# Patient Record
Sex: Male | Born: 1960
Health system: Southern US, Community
[De-identification: ages and names within clinical notes are randomized; demographics above are authoritative.]

## PROBLEM LIST (undated history)

## (undated) DIAGNOSIS — Z87442 Personal history of urinary calculi: Secondary | ICD-10-CM

## (undated) DIAGNOSIS — E785 Hyperlipidemia, unspecified: Secondary | ICD-10-CM

## (undated) DIAGNOSIS — I2510 Atherosclerotic heart disease of native coronary artery without angina pectoris: Secondary | ICD-10-CM

## (undated) DIAGNOSIS — I10 Essential (primary) hypertension: Secondary | ICD-10-CM

## (undated) DIAGNOSIS — M19011 Primary osteoarthritis, right shoulder: Secondary | ICD-10-CM

## (undated) DIAGNOSIS — G4733 Obstructive sleep apnea (adult) (pediatric): Secondary | ICD-10-CM

## (undated) DIAGNOSIS — E669 Obesity, unspecified: Secondary | ICD-10-CM

## (undated) DIAGNOSIS — N200 Calculus of kidney: Secondary | ICD-10-CM

## (undated) HISTORY — DX: Calculus of kidney: N20.0

## (undated) HISTORY — PX: COLONOSCOPY: SHX174

## (undated) HISTORY — PX: OTHER SURGICAL HISTORY: SHX169

## (undated) HISTORY — PX: CARDIAC CATHETERIZATION: SHX172

## (undated) HISTORY — PX: MUSCLE BIOPSY: SHX716

---
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
## 1998-08-22 ENCOUNTER — Encounter: Admission: RE | Admit: 1998-08-22 | Discharge: 1998-11-20 | Payer: Self-pay | Admitting: Family Medicine

## 2003-01-22 ENCOUNTER — Encounter (INDEPENDENT_AMBULATORY_CARE_PROVIDER_SITE_OTHER): Payer: Self-pay | Admitting: Specialist

## 2003-01-22 ENCOUNTER — Ambulatory Visit (HOSPITAL_BASED_OUTPATIENT_CLINIC_OR_DEPARTMENT_OTHER): Admission: RE | Admit: 2003-01-22 | Discharge: 2003-01-22 | Payer: Self-pay | Admitting: General Surgery

## 2008-12-13 ENCOUNTER — Ambulatory Visit (HOSPITAL_COMMUNITY): Payer: Self-pay | Admitting: Psychology

## 2011-02-09 NOTE — Op Note (Signed)
   NAME:  Frederick Baker, Frederick Baker                        ACCOUNT NO.:  [id number]   MEDICAL RECORD NO.:  [id number]                   PATIENT TYPE:  AMB   LOCATION:  DSC                                  FACILITY:  [facility]   PHYSICIAN:  Ollen Gross. Vernell Morgans, M.D.              DATE OF BIRTH:  10-Feb-1961   DATE OF PROCEDURE:  01/22/2003  DATE OF DISCHARGE:                                 OPERATIVE REPORT   PREOPERATIVE DIAGNOSIS:  Myositis.   POSTOPERATIVE DIAGNOSIS:  Myositis.   PROCEDURE:  Right thigh muscle biopsy.   SURGEON:  Ollen Gross. Carolynne Edouard, M.D.   ANESTHESIA:  IV sedation and local.   DESCRIPTION OF PROCEDURE:  After informed consent was obtained, the patient  was brought to the operating room and placed in the supine position on the  operating table.  After adequate induction of some IV sedation, the  patient's right thigh was prepped with Betadine and draped in the usual  sterile manner.  The right thigh area was then infiltrated with 0.25%  Marcaine with epinephrine.  A small longitudinal incision was made with a 15  blade knife.  This incision was carried down through the skin and  subcutaneous tissue with the Bovie electrocautery.  The fascia of the vastus  lateralis was then opened with the Metzenbaum scissors.  A muscle bundle was  isolated with a hemostat and divided proximally and distally with the  Metzenbaum scissors.  The muscle bundle itself was about an inch long.  The  muscle bundle was placed on a tongue depressor and anchored in place with  needles, then sent fresh to pathology for further evaluation.  The wound was  then examined and found to be hemostatic.  The fascia of the vastus  lateralis was then closed with a running 3-0 Vicryl stitch and the skin was  closed with a running 4-0 Monocryl subcuticular stitch.  Benzoin and Steri-  Strips and sterile dressings were applied.  The patient tolerated the  procedure well.  At the end of the case all needle, sponge, and  instrument  counts were correct.  The patient was then taken to the recovery room in  stable condition.                                               Ollen Gross. Vernell Morgans, M.D.    PST/MEDQ  D:  01/22/2003  T:  01/23/2003  Job:  [id number]

## 2012-12-13 ENCOUNTER — Encounter (HOSPITAL_BASED_OUTPATIENT_CLINIC_OR_DEPARTMENT_OTHER): Payer: Self-pay | Admitting: Emergency Medicine

## 2012-12-13 ENCOUNTER — Other Ambulatory Visit: Payer: Self-pay

## 2012-12-13 ENCOUNTER — Ambulatory Visit (HOSPITAL_COMMUNITY): Admit: 2012-12-13 | Payer: Self-pay | Admitting: Cardiovascular Disease

## 2012-12-13 ENCOUNTER — Encounter (HOSPITAL_COMMUNITY)
Admission: EM | Disposition: A | Discharge status: Home / Self Care | Payer: Self-pay | Source: Home / Self Care | Attending: Cardiovascular Disease

## 2012-12-13 ENCOUNTER — Inpatient Hospital Stay (HOSPITAL_BASED_OUTPATIENT_CLINIC_OR_DEPARTMENT_OTHER)
Admission: EM | Admit: 2012-12-13 | Discharge: 2012-12-16 | DRG: 247 | Disposition: A | Discharge status: Home / Self Care | Payer: PRIVATE HEALTH INSURANCE | Attending: Cardiovascular Disease | Admitting: Cardiovascular Disease

## 2012-12-13 DIAGNOSIS — I2582 Chronic total occlusion of coronary artery: Secondary | ICD-10-CM | POA: Diagnosis present

## 2012-12-13 DIAGNOSIS — Z8249 Family history of ischemic heart disease and other diseases of the circulatory system: Secondary | ICD-10-CM

## 2012-12-13 DIAGNOSIS — I10 Essential (primary) hypertension: Secondary | ICD-10-CM | POA: Diagnosis present

## 2012-12-13 DIAGNOSIS — I2119 ST elevation (STEMI) myocardial infarction involving other coronary artery of inferior wall: Principal | ICD-10-CM | POA: Diagnosis present

## 2012-12-13 DIAGNOSIS — Z79899 Other long term (current) drug therapy: Secondary | ICD-10-CM

## 2012-12-13 DIAGNOSIS — I2510 Atherosclerotic heart disease of native coronary artery without angina pectoris: Secondary | ICD-10-CM

## 2012-12-13 DIAGNOSIS — Z955 Presence of coronary angioplasty implant and graft: Secondary | ICD-10-CM

## 2012-12-13 DIAGNOSIS — Z888 Allergy status to other drugs, medicaments and biological substances status: Secondary | ICD-10-CM

## 2012-12-13 DIAGNOSIS — E785 Hyperlipidemia, unspecified: Secondary | ICD-10-CM | POA: Diagnosis present

## 2012-12-13 HISTORY — PX: LEFT HEART CATHETERIZATION WITH CORONARY ANGIOGRAM: SHX5451

## 2012-12-13 HISTORY — PX: PERCUTANEOUS CORONARY STENT INTERVENTION (PCI-S): SHX5485

## 2012-12-13 HISTORY — DX: Atherosclerotic heart disease of native coronary artery without angina pectoris: I25.10

## 2012-12-13 HISTORY — DX: Essential (primary) hypertension: I10

## 2012-12-13 HISTORY — DX: Hyperlipidemia, unspecified: E78.5

## 2012-12-13 LAB — CBC WITH DIFFERENTIAL/PLATELET
Basophils Absolute: 0 10*3/uL (ref 0.0–0.1)
Basophils Relative: 0 % (ref 0–1)
Eosinophils Absolute: 0 10*3/uL (ref 0.0–0.7)
Eosinophils Relative: 0 % (ref 0–5)
HCT: 42.9 % (ref 39.0–52.0)
Hemoglobin: 15.8 g/dL (ref 13.0–17.0)
Lymphocytes Relative: 11 % — ABNORMAL LOW (ref 12–46)
Lymphs Abs: 1.7 10*3/uL (ref 0.7–4.0)
MCH: 33.8 pg (ref 26.0–34.0)
MCHC: 36.8 g/dL — ABNORMAL HIGH (ref 30.0–36.0)
MCV: 91.7 fL (ref 78.0–100.0)
Monocytes Absolute: 0.7 10*3/uL (ref 0.1–1.0)
Monocytes Relative: 5 % (ref 3–12)
Neutro Abs: 12.4 10*3/uL — ABNORMAL HIGH (ref 1.7–7.7)
Neutrophils Relative %: 84 % — ABNORMAL HIGH (ref 43–77)
Platelets: 223 10*3/uL (ref 150–400)
RBC: 4.68 MIL/uL (ref 4.22–5.81)
RDW: 11.9 % (ref 11.5–15.5)
WBC: 14.7 10*3/uL — ABNORMAL HIGH (ref 4.0–10.5)

## 2012-12-13 LAB — COMPREHENSIVE METABOLIC PANEL
ALT: 55 U/L — ABNORMAL HIGH (ref 0–53)
AST: 35 U/L (ref 0–37)
Albumin: 4.6 g/dL (ref 3.5–5.2)
Alkaline Phosphatase: 63 U/L (ref 39–117)
BUN: 14 mg/dL (ref 6–23)
CO2: 26 mEq/L (ref 19–32)
Calcium: 9.4 mg/dL (ref 8.4–10.5)
Chloride: 100 mEq/L (ref 96–112)
Creatinine, Ser: 1.1 mg/dL (ref 0.50–1.35)
GFR calc Af Amer: 87 mL/min — ABNORMAL LOW (ref >90)
GFR calc non Af Amer: 75 mL/min — ABNORMAL LOW (ref >90)
Glucose, Bld: 106 mg/dL — ABNORMAL HIGH (ref 70–99)
Potassium: 3.7 mEq/L (ref 3.5–5.1)
Sodium: 140 mEq/L (ref 135–145)
Total Bilirubin: 0.6 mg/dL (ref 0.3–1.2)
Total Protein: 8.3 g/dL (ref 6.0–8.3)

## 2012-12-13 LAB — MRSA PCR SCREENING: MRSA by PCR: NEGATIVE

## 2012-12-13 LAB — TROPONIN I: Troponin I: <0.3 ng/mL (ref <0.30)

## 2012-12-13 LAB — PROTIME-INR
INR: 1.03 (ref 0.00–1.49)
Prothrombin Time: 13.4 seconds (ref 11.6–15.2)

## 2012-12-13 LAB — APTT: aPTT: 28 seconds (ref 24–37)

## 2012-12-13 SURGERY — LEFT HEART CATHETERIZATION WITH CORONARY ANGIOGRAM
Anesthesia: LOCAL

## 2012-12-13 MED ORDER — ASPIRIN 81 MG PO CHEW
CHEWABLE_TABLET | ORAL | Status: AC
  Administered 2012-12-13: 324 mg via ORAL
  Filled 2012-12-13: qty 4

## 2012-12-13 MED ORDER — FENTANYL CITRATE 0.05 MG/ML IJ SOLN
INTRAMUSCULAR | Status: AC
  Filled 2012-12-13: qty 2

## 2012-12-13 MED ORDER — HEPARIN (PORCINE) IN NACL 2-0.9 UNIT/ML-% IJ SOLN
INTRAMUSCULAR | Status: AC
  Filled 2012-12-13: qty 1000

## 2012-12-13 MED ORDER — ACETAMINOPHEN 325 MG PO TABS: 650.0000 mg | ORAL_TABLET | ORAL | Status: DC | PRN

## 2012-12-13 MED ORDER — ONDANSETRON HCL 4 MG/2ML IJ SOLN: 4.0000 mg | Freq: Once | INTRAMUSCULAR | Status: AC

## 2012-12-13 MED ORDER — HEPARIN SODIUM (PORCINE) 5000 UNIT/ML IJ SOLN: 4000.0000 [IU] | Freq: Once | INTRAMUSCULAR | Status: AC

## 2012-12-13 MED ORDER — HEPARIN SODIUM (PORCINE) 5000 UNIT/ML IJ SOLN
INTRAMUSCULAR | Status: AC
  Administered 2012-12-13: 4000 [IU] via INTRAVENOUS
  Filled 2012-12-13: qty 1

## 2012-12-13 MED ORDER — SODIUM CHLORIDE 0.9 % IV SOLN
INTRAVENOUS | Status: AC
  Administered 2012-12-13: 18:00:00 via INTRAVENOUS

## 2012-12-13 MED ORDER — ASPIRIN 81 MG PO CHEW
81.0000 mg | CHEWABLE_TABLET | Freq: Every day | ORAL | Status: DC
  Administered 2012-12-14 – 2012-12-16 (×3): 81 mg via ORAL
  Filled 2012-12-13 (×3): qty 1

## 2012-12-13 MED ORDER — HEPARIN BOLUS VIA INFUSION: 4000.0000 [IU] | Freq: Once | INTRAVENOUS | Status: DC

## 2012-12-13 MED ORDER — PRASUGREL HCL 10 MG PO TABS
10.0000 mg | ORAL_TABLET | Freq: Every day | ORAL | Status: DC
  Administered 2012-12-15 – 2012-12-16 (×2): 10 mg via ORAL
  Filled 2012-12-13 (×3): qty 1

## 2012-12-13 MED ORDER — LISINOPRIL 10 MG PO TABS
10.0000 mg | ORAL_TABLET | Freq: Every morning | ORAL | Status: DC
  Administered 2012-12-13 – 2012-12-16 (×3): 10 mg via ORAL
  Filled 2012-12-13 (×3): qty 1

## 2012-12-13 MED ORDER — ONDANSETRON HCL 4 MG/2ML IJ SOLN: 4.0000 mg | Freq: Four times a day (QID) | INTRAMUSCULAR | Status: DC | PRN

## 2012-12-13 MED ORDER — MORPHINE SULFATE 2 MG/ML IJ SOLN: 2.0000 mg | INTRAMUSCULAR | Status: DC | PRN

## 2012-12-13 MED ORDER — PRASUGREL HCL 10 MG PO TABS
ORAL_TABLET | ORAL | Status: AC
  Administered 2012-12-14: 10 mg via ORAL
  Filled 2012-12-13: qty 6

## 2012-12-13 MED ORDER — ASPIRIN 81 MG PO CHEW: 324.0000 mg | CHEWABLE_TABLET | Freq: Once | ORAL | Status: AC

## 2012-12-13 MED ORDER — LIDOCAINE HCL (PF) 1 % IJ SOLN
INTRAMUSCULAR | Status: AC
  Filled 2012-12-13: qty 30

## 2012-12-13 MED ORDER — METOPROLOL TARTRATE 25 MG PO TABS
25.0000 mg | ORAL_TABLET | Freq: Two times a day (BID) | ORAL | Status: DC
  Administered 2012-12-13 – 2012-12-16 (×6): 25 mg via ORAL
  Filled 2012-12-13 (×7): qty 1

## 2012-12-13 MED ORDER — BIVALIRUDIN 250 MG IV SOLR
INTRAVENOUS | Status: AC
  Filled 2012-12-13: qty 250

## 2012-12-13 MED ORDER — ONDANSETRON HCL 4 MG/2ML IJ SOLN
INTRAMUSCULAR | Status: AC
  Administered 2012-12-13: 4 mg via INTRAVENOUS
  Filled 2012-12-13: qty 2

## 2012-12-13 MED ORDER — MORPHINE SULFATE 4 MG/ML IJ SOLN
INTRAMUSCULAR | Status: AC
  Administered 2012-12-13: 4 mg via INTRAVENOUS
  Filled 2012-12-13: qty 1

## 2012-12-13 MED ORDER — MIDAZOLAM HCL 2 MG/2ML IJ SOLN
INTRAMUSCULAR | Status: AC
  Filled 2012-12-13: qty 2

## 2012-12-13 MED ORDER — MORPHINE SULFATE 4 MG/ML IJ SOLN: 4.0000 mg | Freq: Once | INTRAMUSCULAR | Status: AC

## 2012-12-13 MED ORDER — ATORVASTATIN CALCIUM 80 MG PO TABS
80.0000 mg | ORAL_TABLET | Freq: Every day | ORAL | Status: DC
  Filled 2012-12-13: qty 1

## 2012-12-13 MED ORDER — NITROGLYCERIN 0.4 MG SL SUBL
0.4000 mg | SUBLINGUAL_TABLET | SUBLINGUAL | Status: DC | PRN
  Administered 2012-12-13: 0.4 mg via SUBLINGUAL
  Filled 2012-12-13: qty 25

## 2012-12-13 MED ORDER — ZOLPIDEM TARTRATE 5 MG PO TABS: 5.0000 mg | ORAL_TABLET | Freq: Every evening | ORAL | Status: DC | PRN

## 2012-12-13 MED ORDER — OXYCODONE-ACETAMINOPHEN 5-325 MG PO TABS: 1.0000 | ORAL_TABLET | ORAL | Status: DC | PRN

## 2012-12-13 NOTE — CV Procedure (Addendum)
Cardiac Catheterization Operative Report  Frederick Baker [id number] 3/22/20144:14 PM No primary provider on file.  Procedure Performed:  1. Left Heart Catheterization 2. Selective Coronary Angiography 3. Left ventricular angiogram 4.   PTCA/DES x 1 posterolateral branch, right 5.   PTCA/DES x 1 distal RCA 6.   Angioseal right femoral artery  Operator: Verne Carrow, MD  Indication:  52 yo male with history of HTN, HLD admitted with inferior STEMI.              Arrival in cath lab at [facility] at 4:18pm. Arterial access at 4:28pm.                         Procedure Details: Emergency consent obtained. The risks, benefits, complications, treatment options, and expected outcomes were discussed with the patient. The patient and/or family concurred with the proposed plan, giving verbal  informed consent. The patient was further sedated with Versed and Fentanyl. The right groin was prepped and draped in the usual manner. Using the modified Seldinger access technique, a 6 French sheath was placed in the right femoral artery. Standard diagnostic catheters were used to perform selective coronary angiography.   The RCA was engaged with a JR4 guide. The patient was given a bolus of Angiomax and a drip was started. He was given Effient 60 mg po x 1. A BMW wire was passed down the RCA into the posterolateral branch but I could not get this wire past the total occlusion so a Whisper wire was passed distally. A 2.0 x 8 mm balloon was used to open the totally occluded posterolateral branch. I then carefully positioned and deployed a 2.25 x 12 mm Promus Premier DES in the posterolateral branch. This was post-dilated with a 2.25 x 8 mm Nash balloon. I then turned by attention to the severe stenosis in the distal RCA. I elected to treat the distal RCA lesion today as it was severe, hazy and could limit inflow into the newly placed more distal stent. The distal RCA stenosis was directly stented with a  3.5 x 12 mm Promus Premier DES. This stent was post-dilated with a 3.5 x 8 mm  balloon. There was an excellent result.   A pigtail catheter was used to perform a left ventricular angiogram. An Angioseal femoral artery closure device was placed in the right femoral artery.   There were no immediate complications. The patient was taken to the recovery area in stable condition.   Hemodynamic Findings: Central aortic pressure: 136/89 Left ventricular pressure: 140/7/17  Angiographic Findings:  Left main: No obstructive disease.   Left Anterior Descending Artery: Large caliber vessel that courses to the apex. Mild diffuse plaque in the proximal, mid and distal segments.   Circumflex Artery: Moderate caliber vessel with moderate caliber first obtuse marginal branch. The proximal Circumflex and the proximal segment of the OM branch have 30% stenosis. Small to moderate caliber intermediate branch.   Right Coronary Artery: Large, dominant vessel with diffuse 20% stenosis proximal vessel, 40% stenosis mid vessel, 95% hazy stenosis distal vessel. The PDA is small in caliber (2.0 mm) and has a 95% mid stenosis. The posterolateral branch is moderate in caliber and has a 100% total occlusion.   Left Ventricular Angiogram: LVEF 60%  Impression: 1. Acute inferior STEMI secondary to occluded posterolateral branch 2. Successful PTCA/DES in the posterolateral branch 3. Successful PTCA/DES x 1 distal RCA 4. Preserved LV systolic function  Recommendations: He will be  admitted to the CCU. He will need ASA and Effient for at least one year. Will start Lopressor. He is noted to have severely elevated LFTs and severe muscle aches in past on statins. Will not start a statin.  Echo before d/c home.        Complications:  None. The patient tolerated the procedure well.

## 2012-12-13 NOTE — ED Notes (Signed)
Attempt to given report to cath lab, no answer. Will try Charge RN [facility] ED.

## 2012-12-13 NOTE — Progress Notes (Signed)
Responded to code stemi. Met pt's wife and was present when dr gave pt update and results of stemi procedure. Wife was pleased and thankful. Will follow-up as needed.  Marjory Lies Chaplain

## 2012-12-13 NOTE — ED Notes (Signed)
Report given to Gaylyn Rong Consulting civil engineer at [facility] ED.

## 2012-12-13 NOTE — ED Provider Notes (Signed)
History    This chart was scribed for Geoffery Lyons, MD scribed by Magnus Sinning. The patient was seen in room MH09/MH09 at 15:30   CSN: [id number]  Arrival date & time 12/13/12  1517   None     Chief Complaint  Patient presents with  . Chest Pain    (Consider location/radiation/quality/duration/timing/severity/associated sxs/prior treatment) Patient is a 52 y.o. male presenting with chest pain. The history is provided by the patient and a relative. No language interpreter was used.  Chest Pain SISTO GRANILLO is a 52 y.o. male who presents to the Emergency Department complaining of  gradually worsening severe midsternal CP radiating down left arm, onset this afternoon with associated chest tightness, SOB, and nausea.   The patient states that he did strenuous chainsaw work this morning and he says he began feeling chest pain that worsened throughout the day. He notes that he went home and took two baby ASA, but he explains he continued to experience chest pain and chest tightness and pressure.   He denies any hx of heart problems, but notes high cholesterol and HTN. He says he is otherwise healthy and denies any hx of stroke, or bleeding disorder. Relative later reports that he does have hx of "statins reaction."  No past medical history on file.  No past surgical history on file.  No family history on file.  History  Substance Use Topics  . Smoking status: Not on file  . Smokeless tobacco: Not on file  . Alcohol Use: Not on file      Review of Systems  Respiratory: Positive for chest tightness.   Cardiovascular: Positive for chest pain.  All other systems reviewed and are negative.    Allergies  Review of patient's allergies indicates not on file.  Home Medications  No current outpatient prescriptions on file.  BP 163/98  Pulse 80  Temp(Src) 97.6 F (36.4 C) (Oral)  Resp 16  Ht 5\' 10"  (1.778 m)  Wt 240 lb (108.863 kg)  BMI 34.44 kg/m2  SpO2  97%  Physical Exam  Nursing note and vitals reviewed. Constitutional: He is oriented to person, place, and time. He appears well-developed and well-nourished. No distress.  HENT:  Head: Normocephalic and atraumatic.  Eyes: Conjunctivae and EOM are normal.  Neck: Neck supple. No tracheal deviation present.  Cardiovascular: Normal rate, regular rhythm and normal heart sounds.   No murmur heard. Pulmonary/Chest: Effort normal. No respiratory distress. He has no wheezes. He has no rales.  Abdominal: Soft. Bowel sounds are normal. He exhibits no distension. There is no tenderness.  Musculoskeletal: Normal range of motion. He exhibits no edema.  Neurological: He is alert and oriented to person, place, and time. No sensory deficit.  Skin: Skin is warm and dry.  Psychiatric: He has a normal mood and affect. His behavior is normal.    ED Course  Procedures (including critical care time) DIAGNOSTIC STUDIES: Oxygen Saturation is 97% on room air, normal by my interpretation.    COORDINATION OF CARE:  15:31: Patient informed of intent to get patient transferred over to [facility] as it is indicated that he is experiencing MI. Pt and family agreeable and currently await further transport details. 15:37: Conservator, museum/gallery. Clifton James. Patient case reviewed and discussed. Dr. Clifton James accepts transfer to [facility].    Labs Reviewed - No data to display No results found.   No diagnosis found.   Date: 12/14/2012  Rate: 87  Rhythm: normal sinus rhythm  QRS Axis: normal  Intervals: normal  ST/T Wave abnormalities: ST elevations inferiorly  Conduction Disutrbances:none  Narrative Interpretation:   Old EKG Reviewed: none available       MDM  The patient presents with chest pain that started after a strenuous firewood cutting session.  Initial ekg in triage was diagnostic of an acute inferior wall MI.  A code stemi was called and the patient was seen immediately by me.  In consultation with Dr.  Clifton James, heparin, aspirin, morphine, oxygen, and ntg were given.  EMS arrived to transport to [facility] for intervention.   CRITICAL CARE Performed by: Geoffery Lyons   Total critical care time: 30 minutes  Critical care time was exclusive of separately billable procedures and treating other patients.  Critical care was necessary to treat or prevent imminent or life-threatening deterioration.  Critical care was time spent personally by me on the following activities: development of treatment plan with patient and/or surrogate as well as nursing, discussions with consultants, evaluation of patient's response to treatment, examination of patient, obtaining history from patient or surrogate, ordering and performing treatments and interventions, ordering and review of laboratory studies, ordering and review of radiographic studies, pulse oximetry and re-evaluation of patient's condition.    I personally performed the services described in this documentation, which was scribed in my presence. The recorded information has been reviewed and is accurate.           Geoffery Lyons, MD 12/14/12 [phone]

## 2012-12-13 NOTE — ED Notes (Signed)
While pt was being triaged. EKG was done showing Acute MI. Taken to ED10. Staff advised. Pt placed on montor. Dr. Judd Lien in. Pt reports he was using a chainsaw earlier today and developed midsternal CP radiating down left arm. +nausea and SHOB. Tried eating something, but did not feel any better. Came to ED.

## 2012-12-13 NOTE — H&P (Signed)
   Patient ID: Frederick Baker MRN: [id number] DOB/AGE: 1960-12-21 52 y.o. Admit date: 12/13/2012  Primary Cardiologist: None  HPI: 52 yo male with history of HTN, HLD who presented to [facility] with c/o chest pain. EKG with inferior ST elevation. Code STEMI called by [facility] ED physician. Emergent transport to [facility] for cardiac cath. Reports pain started in left arm at 1:15pm while cutting wood in the yard. Reports intolerance to statins in the past.   Review of systems complete and found to be negative unless listed above   Past Medical History  Diagnosis Date  . HTN (hypertension)   . Hyperlipidemia     Family History  Problem Relation Age of Onset  . CAD Paternal Uncle     History   Social History  . Marital Status: Single    Spouse Name: N/A    Number of Children: N/A  . Years of Education: N/A   Occupational History  . Works in [employer]    Social History Main Topics  . Smoking status: Never Smoker   . Smokeless tobacco: Not on file  . Alcohol Use: Yes  . Drug Use: No  . Sexually Active: Not on file   Other Topics Concern  . Not on file   Social History Narrative  . No narrative on file    Past Surgical History  Procedure Laterality Date  . None      No Known Allergies  Prior to Admission Meds: None  Physical Exam: Blood pressure 165/90, pulse 101, temperature 97.6 F (36.4 C), temperature source Oral, resp. rate 11, height 5\' 10"  (1.778 m), weight 240 lb (108.863 kg), SpO2 95.00%.    General: Well developed, well nourished, NAD  HEENT: OP clear, mucus membranes moist  SKIN: warm, dry. No rashes.  Neuro: No focal deficits  Musculoskeletal: Muscle strength 5/5 all ext  Psychiatric: Mood and affect normal  Neck: No JVD, no carotid bruits, no thyromegaly, no lymphadenopathy.  Lungs:Clear bilaterally, no wheezes, rhonci, crackles  Cardiovascular: Regular rate and rhythm. No murmurs, gallops or rubs.  Abdomen:Soft. Bowel sounds present.  Non-tender.  Extremities: No lower extremity edema. Pulses are 2 + in the bilateral DP/PT.  Labs:   Lab Results  Component Value Date   WBC 14.7* 12/13/2012   HGB 15.8 12/13/2012   HCT 42.9 12/13/2012   MCV 91.7 12/13/2012   PLT 223 12/13/2012     Recent Labs Lab 12/13/12 1535  NA 140  K 3.7  CL 100  CO2 26  BUN 14  CREATININE 1.10  CALCIUM 9.4  PROT 8.3  BILITOT 0.6  ALKPHOS 63  ALT 55*  AST 35  GLUCOSE 106*   Lab Results  Component Value Date   TROPONINI <0.30 12/13/2012      EKG: NSR, 3-4 inferior ST elevation.   ASSESSMENT AND PLAN:   1. Inferior STEMI: Onset of pain this afternoon. Plan is emergent cath with probable PCI. Heparin 4000 units IV x 1 and 324 mg ASA x 1 in ED.    Frederick Baker 12/13/2012, 4:18 PM

## 2012-12-14 DIAGNOSIS — I2119 ST elevation (STEMI) myocardial infarction involving other coronary artery of inferior wall: Secondary | ICD-10-CM

## 2012-12-14 LAB — LIPID PANEL
Cholesterol: 258 mg/dL — ABNORMAL HIGH (ref 0–200)
HDL: 29 mg/dL — ABNORMAL LOW (ref >39)
LDL Cholesterol: 198 mg/dL — ABNORMAL HIGH (ref 0–99)
Total CHOL/HDL Ratio: 8.9 RATIO
Triglycerides: 154 mg/dL — ABNORMAL HIGH (ref <150)
VLDL: 31 mg/dL (ref 0–40)

## 2012-12-14 LAB — BASIC METABOLIC PANEL
BUN: 14 mg/dL (ref 6–23)
CO2: 24 mEq/L (ref 19–32)
Calcium: 8.8 mg/dL (ref 8.4–10.5)
Chloride: 103 mEq/L (ref 96–112)
Creatinine, Ser: 0.88 mg/dL (ref 0.50–1.35)
GFR calc Af Amer: >90 mL/min (ref >90)
GFR calc non Af Amer: >90 mL/min (ref >90)
Glucose, Bld: 92 mg/dL (ref 70–99)
Potassium: 3.6 mEq/L (ref 3.5–5.1)
Sodium: 141 mEq/L (ref 135–145)

## 2012-12-14 LAB — CBC
HCT: 40.3 % (ref 39.0–52.0)
Hemoglobin: 14.7 g/dL (ref 13.0–17.0)
MCH: 32.9 pg (ref 26.0–34.0)
MCHC: 36.5 g/dL — ABNORMAL HIGH (ref 30.0–36.0)
MCV: 90.2 fL (ref 78.0–100.0)
Platelets: 202 10*3/uL (ref 150–400)
RBC: 4.47 MIL/uL (ref 4.22–5.81)
RDW: 12.6 % (ref 11.5–15.5)
WBC: 10.6 10*3/uL — ABNORMAL HIGH (ref 4.0–10.5)

## 2012-12-14 NOTE — Progress Notes (Addendum)
Patient Name: Frederick Baker Date of Encounter: 12/14/2012     Active Problems:   ST elevation myocardial infarction (STEMI) of inferior wall   HTN (hypertension)    SUBJECTIVE  Doing well.  Mild soreness.  Groin is fine.    CURRENT MEDS . aspirin  81 mg Oral Daily  . lisinopril  10 mg Oral q morning - 10a  . metoprolol tartrate  25 mg Oral BID  . prasugrel  10 mg Oral Daily    OBJECTIVE  Filed Vitals:   12/14/12 0400 12/14/12 0500 12/14/12 0600 12/14/12 0700  BP: 128/74 123/73 116/62 116/74  Pulse:      Temp:    98.3 F (36.8 C)  TempSrc:    Oral  Resp: 17 14 17 9   Height:      Weight:  239 lb 10.2 oz (108.7 kg)    SpO2: 97%   99%    Intake/Output Summary (Last 24 hours) at 12/14/12 0959 Last data filed at 12/14/12 0800  Gross per 24 hour  Intake 1112.5 ml  Output   2650 ml  Net -1537.5 ml   Filed Weights   12/13/12 1520 12/13/12 1736 12/14/12 0500  Weight: 240 lb (108.863 kg) 193 lb 12.6 oz (87.9 kg) 239 lb 10.2 oz (108.7 kg)    PHYSICAL EXAM  General: Pleasant, NAD. Neuro: Alert and oriented X 3. Moves all extremities spontaneously. Psych: Normal affect. HEENT:  Normal  Neck: Supple without bruits or JVD. Lungs:  Resp regular and unlabored, CTA. Heart: RRR no s3, s4, or murmurs. Extremities: No clubbing, cyanosis or edema. DP/PT/Radials 2+ and equal bilaterally. Groin ok  Accessory Clinical Findings  CBC  Recent Labs  12/13/12 1535 12/14/12 0505  WBC 14.7* 10.6*  NEUTROABS 12.4*  --   HGB 15.8 14.7  HCT 42.9 40.3  MCV 91.7 90.2  PLT 223 202   Basic Metabolic Panel  Recent Labs  12/13/12 1535 12/14/12 0505  NA 140 141  K 3.7 3.6  CL 100 103  CO2 26 24  GLUCOSE 106* 92  BUN 14 14  CREATININE 1.10 0.88  CALCIUM 9.4 8.8   Liver Function Tests  Recent Labs  12/13/12 1535  AST 35  ALT 55*  ALKPHOS 63  BILITOT 0.6  PROT 8.3  ALBUMIN 4.6   No results found for this basename: LIPASE, AMYLASE,  in the last 72  hours Cardiac Enzymes  Recent Labs  12/13/12 1535  TROPONINI <0.30   BNP No components found with this basename: POCBNP,  D-Dimer No results found for this basename: DDIMER,  in the last 72 hours Hemoglobin A1C No results found for this basename: HGBA1C,  in the last 72 hours Fasting Lipid Panel  Recent Labs  12/14/12 0505  CHOL 258*  HDL 29*  LDLCALC 198*  TRIG 154*  CHOLHDL 8.9   Thyroid Function Tests No results found for this basename: TSH, T4TOTAL, FREET3, T3FREE, THYROIDAB,  in the last 72 hours  TELE  NSR with one 23 beat run of VT, self resolving.    ECG  NSR. LAD.  Inferior Qs with mild persistent STE.    Radiology/Studies  No results found.  ASSESSMENT AND PLAN   1.  Acute inferior MI treated with DES and DAPT 2.  Hypertension 3.  Hyperlipidemia with statin intolerance.  LDL 198mg /dl  Plan:   1.  Continue DAPT 2.  Transfer to stepdown.  3.  Shoot for dc prob Monday or Tuesday 4.  Cardiac rehab  Signed, Shawnie Pons MD, [facility], FSCAI

## 2012-12-15 DIAGNOSIS — I517 Cardiomegaly: Secondary | ICD-10-CM

## 2012-12-15 DIAGNOSIS — E785 Hyperlipidemia, unspecified: Secondary | ICD-10-CM | POA: Diagnosis present

## 2012-12-15 LAB — POCT ACTIVATED CLOTTING TIME: Activated Clotting Time: 611 seconds

## 2012-12-15 MED ORDER — EZETIMIBE 10 MG PO TABS
10.0000 mg | ORAL_TABLET | Freq: Every day | ORAL | Status: DC
  Administered 2012-12-15 – 2012-12-16 (×2): 10 mg via ORAL
  Filled 2012-12-15 (×2): qty 1

## 2012-12-15 MED FILL — Dextrose Inj 5%: INTRAVENOUS | Qty: 50 | Status: AC

## 2012-12-15 NOTE — Progress Notes (Signed)
UR Completed Larua Collier Graves-Bigelow, RN,BSN [phone]  

## 2012-12-15 NOTE — Progress Notes (Signed)
    SUBJECTIVE: No chest pain or SOB.   BP 137/78  Pulse 73  Temp(Src) 98.3 F (36.8 C) (Oral)  Resp 18  Ht 5\' 10"  (1.778 m)  Wt 239 lb 10.2 oz (108.7 kg)  BMI 34.38 kg/m2  SpO2 97%  Intake/Output Summary (Last 24 hours) at 12/15/12 0731 Last data filed at 12/14/12 1800  Gross per 24 hour  Intake    960 ml  Output      3 ml  Net    957 ml    PHYSICAL EXAM General: Well developed, well nourished, in no acute distress. Alert and oriented x 3.  Psych:  Good affect, responds appropriately Neck: No JVD. No masses noted.  Lungs: Clear bilaterally with no wheezes or rhonci noted.  Heart: RRR with no murmurs noted. Abdomen: Bowel sounds are present. Soft, non-tender.  Extremities: No lower extremity edema.   LABS: Basic Metabolic Panel:  Recent Labs  40/98/11 1535 12/14/12 0505  NA 140 141  K 3.7 3.6  CL 100 103  CO2 26 24  GLUCOSE 106* 92  BUN 14 14  CREATININE 1.10 0.88  CALCIUM 9.4 8.8   CBC:  Recent Labs  12/13/12 1535 12/14/12 0505  WBC 14.7* 10.6*  NEUTROABS 12.4*  --   HGB 15.8 14.7  HCT 42.9 40.3  MCV 91.7 90.2  PLT 223 202   Fasting Lipid Panel:  Recent Labs  12/14/12 0505  CHOL 258*  HDL 29*  LDLCALC 198*  TRIG 154*  CHOLHDL 8.9    Current Meds: . aspirin  81 mg Oral Daily  . lisinopril  10 mg Oral q morning - 10a  . metoprolol tartrate  25 mg Oral BID  . prasugrel  10 mg Oral Daily     ASSESSMENT AND PLAN:  1. Acute inferior STEMI: Doing well. He will need dual anti-platelet therapy for at least one year with ASA and Effient.. Continue beta blocker and Ace-inh. He is intolerant of statins. Echo today.   2. HTN: BP well controlled.  3. HLD: Intolerant of statins. Will add Zetia.   4. Dispo: Transfer to telemetry today. Cardiac rehab.   Frederick Baker  3/24/20147:31 AM

## 2012-12-15 NOTE — Progress Notes (Addendum)
[id number] Cardiac Rehab Pt has been ambulating in hall, denies any cp or SOB. Started MI and stent education with pt and wife. Pt agrees to Outpt. [facility] in [place]  will send referral. Encouraged pt and wife to watch cardiac videos. Will follow pt tomorrow to continue education. Melina Copa RN

## 2012-12-15 NOTE — Progress Notes (Signed)
Echocardiogram 2D Echocardiogram has been performed.  Zayveon Raschke 12/15/2012, 2:59 PM

## 2012-12-16 ENCOUNTER — Encounter (HOSPITAL_COMMUNITY): Payer: Self-pay | Admitting: Nurse Practitioner

## 2012-12-16 ENCOUNTER — Telehealth: Payer: Self-pay | Admitting: Nurse Practitioner

## 2012-12-16 DIAGNOSIS — I2510 Atherosclerotic heart disease of native coronary artery without angina pectoris: Secondary | ICD-10-CM

## 2012-12-16 LAB — CBC
HCT: 41.3 % (ref 39.0–52.0)
Hemoglobin: 14.9 g/dL (ref 13.0–17.0)
MCH: 33.1 pg (ref 26.0–34.0)
MCHC: 36.1 g/dL — ABNORMAL HIGH (ref 30.0–36.0)
MCV: 91.8 fL (ref 78.0–100.0)
Platelets: 202 10*3/uL (ref 150–400)
RBC: 4.5 MIL/uL (ref 4.22–5.81)
RDW: 12.3 % (ref 11.5–15.5)
WBC: 9 10*3/uL (ref 4.0–10.5)

## 2012-12-16 LAB — BASIC METABOLIC PANEL
BUN: 17 mg/dL (ref 6–23)
CO2: 24 mEq/L (ref 19–32)
Calcium: 9 mg/dL (ref 8.4–10.5)
Chloride: 103 mEq/L (ref 96–112)
Creatinine, Ser: 0.88 mg/dL (ref 0.50–1.35)
GFR calc Af Amer: >90 mL/min (ref >90)
GFR calc non Af Amer: >90 mL/min (ref >90)
Glucose, Bld: 88 mg/dL (ref 70–99)
Potassium: 3.8 mEq/L (ref 3.5–5.1)
Sodium: 139 mEq/L (ref 135–145)

## 2012-12-16 MED ORDER — EZETIMIBE 10 MG PO TABS: 10.0000 mg | ORAL_TABLET | Freq: Every day | ORAL | Status: DC

## 2012-12-16 MED ORDER — PRASUGREL HCL 10 MG PO TABS: 10.0000 mg | ORAL_TABLET | Freq: Every day | ORAL | Status: DC

## 2012-12-16 MED ORDER — LISINOPRIL 10 MG PO TABS: 10.0000 mg | ORAL_TABLET | Freq: Every day | ORAL | Status: DC

## 2012-12-16 MED ORDER — NITROGLYCERIN 0.4 MG SL SUBL: 0.4000 mg | SUBLINGUAL_TABLET | SUBLINGUAL | Status: DC | PRN

## 2012-12-16 MED ORDER — METOPROLOL TARTRATE 25 MG PO TABS: 25.0000 mg | ORAL_TABLET | Freq: Two times a day (BID) | ORAL | Status: DC

## 2012-12-16 NOTE — Telephone Encounter (Signed)
Patient called no answer.LMTC. 

## 2012-12-16 NOTE — Discharge Summary (Signed)
Patient ID: Frederick Baker,  MRN: [id number], DOB/AGE: 02-22-1961 52 y.o.  Admit date: 12/13/2012 Discharge date: 12/16/2012  Primary Cardiologist: C. Clifton James, MD  Discharge Diagnoses Principal Problem:   ST elevation myocardial infarction (STEMI) of inferior wall  **s/p PCI/DES of the distal RCA and RPL this admission. Active Problems:   CAD (coronary artery disease)   HTN (hypertension)   Hyperlipidemia  **Intolerant to statins.  Allergies Allergies  Allergen Reactions  . Other     Mushrooms - GI Problems  . Statins     Myalgias    Procedures  Cardiac Catheterization and Percutaneous Coronary Intervention 12/13/2012  Procedure Details: Hemodynamic Findings: Central aortic pressure: 136/89 Left ventricular pressure: 140/7/17  Angiographic Findings:  Left main: No obstructive disease.    Left Anterior Descending Artery: Large caliber vessel that courses to the apex. Mild diffuse plaque in the proximal, mid and distal segments.   Circumflex Artery: Moderate caliber vessel with moderate caliber first obtuse marginal branch. The proximal Circumflex and the proximal segment of the OM branch have 30% stenosis. Small to moderate caliber intermediate branch.   Right Coronary Artery: Large, dominant vessel with diffuse 20% stenosis proximal vessel, 40% stenosis mid vessel, 95% hazy stenosis distal vessel. The PDA is small in caliber (2.0 mm) and has a 95% mid stenosis. The posterolateral branch is moderate in caliber and has a 100% total occlusion.    **The distal RCA was successfully stented using a 3.5 x 12 mm Promus Premier DES.  The RPL was successfully stented using a 2.25 x 12 mm Promus Premier DES.**  Left Ventricular Angiogram: LVEF 60% _____________  2D Echocardiogram 12/15/2012  Study Conclusions  - Left ventricle: Inferobasal hypokinesis The cavity size   was normal. Wall thickness was increased in a pattern of   mild LVH. The estimated ejection fraction was  55%. - Left atrium: The atrium was mildly dilated. - Atrial septum: No defect or patent foramen ovale was   identified. _____________  History of Present Illness  52 year old male without prior cardiac history who was in his usual state of health until the day of admission when he had sudden onset of substernal chest discomfort while cutting wood in his yard. He presented to mid center at [facility] and was found to have inferior ST segment elevation. A code STEMI was called and the patient was transferred to [facility] cone for emergent diagnostic catheterization.  Hospital Course  Patient underwent emergent cardiac catheterization revealing severe stenosis within the distal right coronary artery and a total occlusion of the right posterolateral branch. His LAD and circumflex were without significant disease. LV function was normal. The distal right coronary artery and right posterior lateral branch were both successfully intervened upon with placement of drug-eluting stents in each location. Patient tolerated procedure well and was transferred to the coronary intensive care unit post procedure. He had no further chest pain and was placed on aspirin, effient, beta blocker, and ACE inhibitor therapy. He has prior history of severe statin intolerance and therefore he was placed on zetia instead.  He was transferred out to the floor on March 24th and has ambulated with cardiac rehabilitation without recurrent symptoms or limitations. 2-D echocardiogram was carried out and confirmed normal LV function. We plan to discharge him home today in good condition. He will followup within the next 10 days in our office.  Discharge Vitals Blood pressure 128/80, pulse 60, temperature 98.2 F (36.8 C), temperature source Oral, resp. rate 18, height 5\' 10"  (1.778  m), weight 239 lb (108.41 kg), SpO2 93.00%.  Filed Weights   12/13/12 1736 12/14/12 0500 12/15/12 1000  Weight: 193 lb 12.6 oz (87.9 kg) 239 lb 10.2 oz  (108.7 kg) 239 lb (108.41 kg)   Labs  CBC  Recent Labs  12/13/12 1535 12/14/12 0505 12/16/12 0547  WBC 14.7* 10.6* 9.0  NEUTROABS 12.4*  --   --   HGB 15.8 14.7 14.9  HCT 42.9 40.3 41.3  MCV 91.7 90.2 91.8  PLT 223 202 202   Basic Metabolic Panel  Recent Labs  12/14/12 0505 12/16/12 0547  NA 141 139  K 3.6 3.8  CL 103 103  CO2 24 24  GLUCOSE 92 88  BUN 14 17  CREATININE 0.88 0.88  CALCIUM 8.8 9.0   Liver Function Tests  Recent Labs  12/13/12 1535  AST 35  ALT 55*  ALKPHOS 63  BILITOT 0.6  PROT 8.3  ALBUMIN 4.6   Cardiac Enzymes  Recent Labs  12/13/12 1535  TROPONINI <0.30   Fasting Lipid Panel  Recent Labs  12/14/12 0505  CHOL 258*  HDL 29*  LDLCALC 198*  TRIG 154*  CHOLHDL 8.9   Disposition  Pt is being discharged home today in good condition.  Follow-up Plans & Appointments      Follow-up Information   Follow up with [name], PA-C On 12/24/2012. (11:15 AM - Dr. Gibson Ramp PA)    Contact information:   [address] [place] [address] [phone]      Discharge Medications    Medication List    TAKE these medications       aspirin 81 MG tablet  Take 81 mg by mouth daily.     ezetimibe 10 MG tablet  Commonly known as:  ZETIA  Take 1 tablet (10 mg total) by mouth daily.     lisinopril 10 MG tablet  Commonly known as:  PRINIVIL,ZESTRIL  Take 1 tablet (10 mg total) by mouth daily.     metoprolol tartrate 25 MG tablet  Commonly known as:  LOPRESSOR  Take 1 tablet (25 mg total) by mouth 2 (two) times daily.     nitroGLYCERIN 0.4 MG SL tablet  Commonly known as:  NITROSTAT  Place 1 tablet (0.4 mg total) under the tongue every 5 (five) minutes as needed for chest pain.     prasugrel 10 MG Tabs  Commonly known as:  EFFIENT  Take 1 tablet (10 mg total) by mouth daily.      Outstanding Labs/Studies  Follow-up lipids/lft's in 8 weeks.  Duration of Discharge Encounter   Greater than 30  minutes including physician time.  Signed, Nicolasa Ducking NP 12/16/2012, 9:50 AM

## 2012-12-16 NOTE — Progress Notes (Signed)
    SUBJECTIVE: No chest pain or SOB.   BP 128/80  Pulse 60  Temp(Src) 98.2 F (36.8 C) (Oral)  Resp 18  Ht 5\' 10"  (1.778 m)  Wt 239 lb (108.41 kg)  BMI 34.29 kg/m2  SpO2 93%  Intake/Output Summary (Last 24 hours) at 12/16/12 1308 Last data filed at 12/16/12 6578  Gross per 24 hour  Intake      0 ml  Output      0 ml  Net      0 ml    PHYSICAL EXAM General: Well developed, well nourished, in no acute distress. Alert and oriented x 3.  Psych:  Good affect, responds appropriately Neck: No JVD. No masses noted.  Lungs: Clear bilaterally with no wheezes or rhonci noted.  Heart: RRR with no murmurs noted. Abdomen: Bowel sounds are present. Soft, non-tender.  Extremities: No lower extremity edema.   LABS: Basic Metabolic Panel:  Recent Labs  46/96/29 0505 12/16/12 0547  NA 141 139  K 3.6 3.8  CL 103 103  CO2 24 24  GLUCOSE 92 88  BUN 14 17  CREATININE 0.88 0.88  CALCIUM 8.8 9.0   CBC:  Recent Labs  12/13/12 1535 12/14/12 0505 12/16/12 0547  WBC 14.7* 10.6* 9.0  NEUTROABS 12.4*  --   --   HGB 15.8 14.7 14.9  HCT 42.9 40.3 41.3  MCV 91.7 90.2 91.8  PLT 223 202 202   Cardiac Enzymes:  Recent Labs  12/13/12 1535  TROPONINI <0.30   Fasting Lipid Panel:  Recent Labs  12/14/12 0505  CHOL 258*  HDL 29*  LDLCALC 198*  TRIG 154*  CHOLHDL 8.9    Current Meds: . aspirin  81 mg Oral Daily  . ezetimibe  10 mg Oral Daily  . lisinopril  10 mg Oral q morning - 10a  . metoprolol tartrate  25 mg Oral BID  . prasugrel  10 mg Oral Daily   Echo 12/15/12: - Left ventricle: Inferobasal hypokinesis The cavity size was normal. Wall thickness was increased in a pattern of mild LVH. The estimated ejection fraction was 55%. - Left atrium: The atrium was mildly dilated. - Atrial septum: No defect or patent foramen ovale was identified.   ASSESSMENT AND PLAN:  1. Acute inferior STEMI: Doing well. He will need dual anti-platelet therapy for at least one year  with ASA and Effient.. Continue beta blocker and Ace-inh. He is intolerant of statins. Zetia was started yesterday. Echo yesterday with preserved LV function.    2. HTN: BP well controlled.   3. HLD: Intolerant of statins. Zetia started yesterday.    4. Dispo: D/C home today. Follow up with me or Tereso Newcomer, PA-C in 2-3 weeks.  He needs to be out work until Monday and then light duty at work for two weeks. He will need SL NTG.     Frederick Baker  3/25/20147:09 AM

## 2012-12-16 NOTE — Progress Notes (Signed)
Received order to discharge patient to home. Discussed all discharge teaching with patient and patient's wife. Including medications, appointments, activity, diet, chest pain and nitroglycerin, when to call the doctor, when to call 911, how to prevent infection, skin care and monitoring of groin site, checking blood pressure/pulse and all other discharge instructions. Copy of discharge instructions given to patient, all questions and concerns answered, IV removed.

## 2012-12-16 NOTE — Telephone Encounter (Signed)
Spoke to patient he stated he is doing good.States he understands his discharge instructions and medications.Advised to keep appointment with [name] PA 12/24/12 at 11:15 AM.

## 2012-12-16 NOTE — Telephone Encounter (Signed)
New Problem:     I scheduled a 14 day TOC appointment with Herma Carson on 12/24/12 at 11:15 a.m. per Ward Givens PA.

## 2012-12-16 NOTE — Care Management Note (Signed)
    Page 1 of 1   12/16/2012     11:09:12 AM   CARE MANAGEMENT NOTE 12/16/2012  Patient:  Frederick Baker, Frederick Baker   Account Number:  [id number]  Date Initiated:  12/15/2012  Documentation initiated by:  GRAVES-BIGELOW,Tasia Liz  Subjective/Objective Assessment:   Pt admitted with Stemi on effient.     Action/Plan:   CM will continue to monitor for disposition needs.   Anticipated DC Date:  12/16/2012   Anticipated DC Plan:  HOME/SELF CARE         Choice offered to / List presented to:             Status of service:  Completed, signed off Medicare Important Message given?   (If response is "NO", the following Medicare IM given date fields will be blank) Date Medicare IM given:   Date Additional Medicare IM given:    Discharge Disposition:  HOME W HOME HEALTH SERVICES  Per UR Regulation:  Reviewed for med. necessity/level of care/duration of stay  If discussed at Long Length of Stay Meetings, dates discussed:    Comments:  12-16-12 1107 Tomi Bamberger, RN,BSN [phone] 30 day free effient and co pay card given to pt this am. No further needs from CM at this time.

## 2012-12-16 NOTE — Discharge Summary (Signed)
See full note this am. cdm 

## 2012-12-16 NOTE — Progress Notes (Signed)
[id number] Cardiac Rehab Completed discharge edcaution with pt. He voices understanding. Answered pt's questions. Melina Copa RN

## 2012-12-16 NOTE — Telephone Encounter (Signed)
Follow-up:    Patient returned your call.  Please call back. 

## 2012-12-22 ENCOUNTER — Telehealth: Payer: Self-pay | Admitting: Cardiovascular Disease

## 2012-12-22 NOTE — Telephone Encounter (Signed)
New problem   Pt had cath and 2 stents on 12/13/12 this weekend pt started with irregular heartbeat and he is uncomfortable with this. He would like to speak to a nurse concerning this matter.

## 2012-12-22 NOTE — Telephone Encounter (Signed)
Pt called because, this past Saturday he had an episode of palpitations that lasted for about 5 to 10 minutes. No other symptoms. Pt has had these symptoms when he was in the hospital for cardiac cath on 12/16/12. Pt was D/C with Metoprolol 25 mg twice a day. Pt states still has  some palpitations on,and off,but not other symptoms. Pt has an appointment with [name] PA on 12/24/12. At 11:15 AM pt is to call the office if the palpitations occur again before his appointment and has symptoms. Pt aware , he verbalized understanding.

## 2012-12-24 ENCOUNTER — Encounter: Payer: Self-pay | Admitting: Physician Assistant

## 2012-12-24 ENCOUNTER — Ambulatory Visit (INDEPENDENT_AMBULATORY_CARE_PROVIDER_SITE_OTHER): Payer: PRIVATE HEALTH INSURANCE | Admitting: Physician Assistant

## 2012-12-24 ENCOUNTER — Telehealth: Payer: Self-pay

## 2012-12-24 VITALS — BP 114/70 | HR 64 | Ht 70.0 in | Wt 231.0 lb

## 2012-12-24 DIAGNOSIS — I10 Essential (primary) hypertension: Secondary | ICD-10-CM

## 2012-12-24 DIAGNOSIS — E785 Hyperlipidemia, unspecified: Secondary | ICD-10-CM

## 2012-12-24 DIAGNOSIS — R002 Palpitations: Secondary | ICD-10-CM

## 2012-12-24 DIAGNOSIS — I2510 Atherosclerotic heart disease of native coronary artery without angina pectoris: Secondary | ICD-10-CM

## 2012-12-24 LAB — BASIC METABOLIC PANEL
BUN: 20 mg/dL (ref 6–23)
CO2: 29 mEq/L (ref 19–32)
Calcium: 9.4 mg/dL (ref 8.4–10.5)
Chloride: 99 mEq/L (ref 96–112)
Creatinine, Ser: 1.2 mg/dL (ref 0.4–1.5)
GFR: 69.3 mL/min (ref >60.00)
Glucose, Bld: 87 mg/dL (ref 70–99)
Potassium: 4.4 mEq/L (ref 3.5–5.1)
Sodium: 136 mEq/L (ref 135–145)

## 2012-12-24 MED ORDER — METOPROLOL TARTRATE 25 MG PO TABS: 25.0000 mg | ORAL_TABLET | Freq: Two times a day (BID) | ORAL | Status: DC

## 2012-12-24 MED ORDER — LISINOPRIL 10 MG PO TABS: 10.0000 mg | ORAL_TABLET | Freq: Every day | ORAL | Status: DC

## 2012-12-24 MED ORDER — EZETIMIBE 10 MG PO TABS: 10.0000 mg | ORAL_TABLET | Freq: Every day | ORAL | Status: DC

## 2012-12-24 MED ORDER — PRASUGREL HCL 10 MG PO TABS: 10.0000 mg | ORAL_TABLET | Freq: Every day | ORAL | Status: DC

## 2012-12-24 NOTE — Patient Instructions (Addendum)
Your physician recommends that you return for lab work in: today and on 4/2 here at this office  Your physician has recommended that you wear an event monitor. Event monitors are medical devices that record the heart's electrical activity. Doctors most often [place] these monitors to diagnose arrhythmias. Arrhythmias are problems with the speed or rhythm of the heartbeat. The monitor is a small, portable device. You can wear one while you do your normal daily activities. This is usually used to diagnose what is causing palpitations/syncope (passing out).  Your physician recommends that you continue on your current medications as directed. Please refer to the Current Medication list given to you today.  Your physician recommends that you schedule a follow-up appointment in: Feb 18, 2013 @ 3:00

## 2012-12-24 NOTE — Addendum Note (Signed)
Addended by: Demetrios Loll on: 12/24/2012 03:18 PM   Modules accepted: Orders

## 2012-12-24 NOTE — Assessment & Plan Note (Signed)
blood pressure stable 

## 2012-12-24 NOTE — Telephone Encounter (Signed)
**Note De-Identified Frederick Baker Obfuscation** Per [name], PA-C pt is advised to have lipid/LFT's drawn in 1 month and to wear a 21 day event monitor. He agrees with plan and is aware that someone from scheduling from this office will contact him with date and time for monitor attachment. Also, labs to be drawn on 5/2, pt aware and agrees with plan.

## 2012-12-24 NOTE — Progress Notes (Signed)
HPI:  This is a 52 year old white male patient who suffered a STEMI and went to [facility] emergency room and was transferred to [facility] to undergo cardiac catheterization. He underwent PCI and drug-eluting stent to the distal RCA and a right posterolateral vessel. He had mild diffuse plaque in the proximal mid and distal LAD and 30% OM. Ejection fraction was 60%. Followup 2-D echo showed EF of 55% with mild LVH inferior basal hypokinesis. The patient has a history of severe statin intolerance and was placed on study of her lipids.  The patient comes in today for followup. Has multiple questions and seems stressed about his diagnosis of coronary artery disease. He says he's had occasional fluttering in his chest since he's been home.He had this prior to hospitalization but says he has to take a deep breath on occasion when this happens.He has had nothing prolonged period On reviewing his hospital records he did have a 23 beat run of V. Tach that resolved on its own the day of admission and did not recur. He denies any chest pain, palpitations, dyspnea, dyspnea on exertion, dizziness, or presyncope. He wants to drive. He is worried about his work environment being on [employer]. 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  Allergies:  -- Other    --  Mushrooms - GI Problems  -- Statins    --  Myalgias  Current Outpatient Prescriptions on File Prior to Visit: aspirin 81 MG tablet, Take 81 mg by mouth daily., Disp: , Rfl:  nitroGLYCERIN (NITROSTAT) 0.4 MG SL tablet, Place 1 tablet (0.4 mg total) under the tongue every 5 (five) minutes as needed for chest pain., Disp: 25 tablet, Rfl: 3  No current facility-administered medications on file prior to visit.   Past Medical History:   HTN (hypertension)                                          Hyperlipidemia                                               CAD (coronary artery disease)                                  Comment:a. 11/2012 Acute Inf STEMI/Cath/PCI: LM nl, LAD               nl, LCX 30p, OM1 30p, RCA 20p, 80m, 95d (3.5x12              promus premier DES), PDA 43m (2.45mm vessel),               RPL 100 (2.25x12 promus premier DES), EF 60%;                b. 11/2012 Echo: EF 55%, mild LVH.  Past Surgical History:   None  Review of patient's family history indicates:   CAD                            Paternal Uncle           Social History   Marital Status: Single              Spouse Name:                      Years of Education:                 Number of children:             Occupational History Occupation          Psychiatric nurse              Works in [employer]                        Social History Main Topics   Smoking Status: Never Smoker                     Smokeless Status: Not on file                      Alcohol Use: Yes            Drug Use: No             Sexual Activity: Not on file        Other Topics            Concern   None on file  Social History Narrative   None on file    ROS: See history of present illness otherwise negative  PHYSICAL EXAM: Well-nournished, in no acute distress. Neck: No JVD, HJR, Bruit, or thyroid enlargement  Lungs: No tachypnea, clear without wheezing, rales, or rhonchi  Cardiovascular: RRR, PMI not displaced, heart sounds normal, no murmurs, gallops, bruit, thrill, or heave.  Abdomen: BS normal. Soft without organomegaly, masses, lesions or tenderness.  Extremities:right groin ecchymotic at catheter site but no evidence of hematoma or hemorrhage, and lower extremities without cyanosis, clubbing or edema. Good distal pulses bilateral  SKin: Warm, no lesions or rashes   Musculoskeletal: No deformities  Neuro: no focal signs  BP 114/70  Pulse  64  Ht 5\' 10"  (1.778 m)  Wt 231 lb (104.781 kg)  BMI 33.15 kg/m2  SpO2 97%   UJW:[id number] sinus rhythm with inferior MI, age undetermined

## 2012-12-24 NOTE — Assessment & Plan Note (Signed)
Status post STEMI treated with drug-eluting stent to the distal RCA in right posterolateral. Patient is without chest pain is having some palpitations and did have some nonsustained V. Tach in the hospital. We will check a bmet and to make sure his potassium is okay and placement of that record her on him. He did have normal LV function.

## 2012-12-24 NOTE — Assessment & Plan Note (Signed)
Check fasting lipid panel and LFTs in 6 weeks. 

## 2012-12-29 ENCOUNTER — Telehealth: Payer: Self-pay | Admitting: Cardiovascular Disease

## 2012-12-29 ENCOUNTER — Encounter (INDEPENDENT_AMBULATORY_CARE_PROVIDER_SITE_OTHER): Payer: PRIVATE HEALTH INSURANCE

## 2012-12-29 DIAGNOSIS — R002 Palpitations: Secondary | ICD-10-CM

## 2012-12-29 NOTE — Telephone Encounter (Signed)
Pt told that I will forward note to dr/nurse, both out of office today and will return tomorrow, pt agreed to plan.

## 2012-12-29 NOTE — Telephone Encounter (Signed)
21 day event monitor placed on Pt 12/29/12 TK

## 2012-12-29 NOTE — Telephone Encounter (Signed)
Was giving a note to do lite duty x 2 weeks after the hospital.  When I saw Frederick Baker last week - she stated that it should be a month. My job don't want me to do lite duty and will not pay me. I just receive the monitor I will wear for 3 weeks.    At the end of this week can you review the  Monitor and determined if I can go back to full time.

## 2012-12-30 NOTE — Telephone Encounter (Signed)
He should be able to return to work if he feels well. chris

## 2012-12-30 NOTE — Telephone Encounter (Signed)
Spoke with pt and gave him information from Dr. Clifton James. He has been out of work for 2 weeks as his company did not want him to do light duty. He will plan on returning to work next week with regular duties. He will let us know if he needs a note for work. He is feeling well except for dry cough that occurs around 10 PM every evening. He had a cold prior to hospitalization and had cough at that time. Also states he has problems with a cough during the Spring allergy season.  He is on Lisinopril and I told him this may be contributing to his cough. He will continue to monitor and call us back if it continues.

## 2013-01-01 ENCOUNTER — Encounter (HOSPITAL_COMMUNITY)
Admission: RE | Admit: 2013-01-01 | Discharge: 2013-01-01 | Disposition: A | Discharge status: Home / Self Care | Payer: PRIVATE HEALTH INSURANCE | Source: Ambulatory Visit | Attending: Cardiovascular Disease | Admitting: Cardiovascular Disease

## 2013-01-01 DIAGNOSIS — I2119 ST elevation (STEMI) myocardial infarction involving other coronary artery of inferior wall: Secondary | ICD-10-CM | POA: Insufficient documentation

## 2013-01-01 DIAGNOSIS — I2510 Atherosclerotic heart disease of native coronary artery without angina pectoris: Secondary | ICD-10-CM | POA: Insufficient documentation

## 2013-01-01 DIAGNOSIS — Z5189 Encounter for other specified aftercare: Secondary | ICD-10-CM | POA: Insufficient documentation

## 2013-01-01 DIAGNOSIS — Z9861 Coronary angioplasty status: Secondary | ICD-10-CM | POA: Insufficient documentation

## 2013-01-01 NOTE — Progress Notes (Signed)
Cardiac Rehab Medication Review by a Pharmacist  Does the patient  feel that his/her medications are working for him/her?  yes  Has the patient been experiencing any side effects to the medications prescribed?  Yes. Experiencing cough with lisinopril. He recently recovered from a cold, so cough may potentially be due tot hat. Patient called Md's nurse and made her aware. He plans to follow up if cough persists.   Does the patient measure his/her own blood pressure or blood glucose at home?  yes   Does the patient have any problems obtaining medications due to transportation or finances?   no  Understanding of regimen: excellent Understanding of indications: excellent Potential of compliance: excellent   Frederick Baker 01/01/2013 8:54 AM

## 2013-01-05 ENCOUNTER — Telehealth: Payer: Self-pay | Admitting: Cardiovascular Disease

## 2013-01-05 ENCOUNTER — Encounter (HOSPITAL_COMMUNITY)
Admission: RE | Admit: 2013-01-05 | Discharge: 2013-01-05 | Disposition: A | Discharge status: Home / Self Care | Payer: PRIVATE HEALTH INSURANCE | Source: Ambulatory Visit | Attending: Cardiovascular Disease | Admitting: Cardiovascular Disease

## 2013-01-05 DIAGNOSIS — I10 Essential (primary) hypertension: Secondary | ICD-10-CM

## 2013-01-05 MED ORDER — LOSARTAN POTASSIUM 25 MG PO TABS: 25.0000 mg | ORAL_TABLET | Freq: Every day | ORAL | Status: DC

## 2013-01-05 NOTE — Telephone Encounter (Signed)
New Problem:    Patient called in with a question about his lisinopril (PRINIVIL,ZESTRIL) 10 MG tablet because he believes that it is causing him to have a dry cough and he would like to find another medication to replace it.  Also wanted to know if he is using monitor that he is wearing properly.  Please call back.

## 2013-01-05 NOTE — Telephone Encounter (Signed)
Reviewed with Dr. Patty Sermons who gave instructions to stop Lisinopril and start Losartan 25 mg by mouth daily. I spoke with pt and gave him this information. Will send prescription for Losartan to [facility] on [address]  He also uses [facility] for some prescriptions and will call us back if he would like prescription sent there.

## 2013-01-05 NOTE — Telephone Encounter (Signed)
Spoke with pt. He reports cough has not improved. Describes as feeling like a tickle. Non productive. Occurs off and on during day. Will review with Dr. Patty Sermons (office DOD) regarding possible change in medication.  Also states monitor makes beeping sounds at times. Pt is not feeling any palpitations when beeping occurs.  Reviewed with Rose in holter room and battery may be running low and pt should recharge monitor. I gave pt this information.

## 2013-01-05 NOTE — Progress Notes (Signed)
Pt started cardiac rehab today.  Pt tolerated light exercise without difficulty. Telemetry rhythm Sinus. Vital signs stable. Will continue to monitor the patient throughout  the program.  

## 2013-01-07 ENCOUNTER — Encounter (HOSPITAL_COMMUNITY)
Admission: RE | Admit: 2013-01-07 | Discharge: 2013-01-07 | Disposition: A | Discharge status: Home / Self Care | Payer: PRIVATE HEALTH INSURANCE | Source: Ambulatory Visit | Attending: Cardiovascular Disease | Admitting: Cardiovascular Disease

## 2013-01-09 ENCOUNTER — Encounter (HOSPITAL_COMMUNITY)
Admission: RE | Admit: 2013-01-09 | Discharge: 2013-01-09 | Disposition: A | Discharge status: Home / Self Care | Payer: PRIVATE HEALTH INSURANCE | Source: Ambulatory Visit | Attending: Cardiovascular Disease | Admitting: Cardiovascular Disease

## 2013-01-09 NOTE — Progress Notes (Signed)
Frederick Baker's quality of life questionnaire reviewed today.

## 2013-01-12 ENCOUNTER — Encounter (HOSPITAL_COMMUNITY)
Admission: RE | Admit: 2013-01-12 | Discharge: 2013-01-12 | Disposition: A | Discharge status: Home / Self Care | Payer: PRIVATE HEALTH INSURANCE | Source: Ambulatory Visit | Attending: Cardiovascular Disease | Admitting: Cardiovascular Disease

## 2013-01-12 NOTE — Progress Notes (Signed)
[id number] Reviewed home exercise guidelines with patient including endpoints, temperature precautions, target heart rate and rate of perceived exertion. Pt plans to walk and has a treadmill and elliptical at home, which he plans to use as his mode of home exercise. Pt voices understanding of instructions given.  Cristy Hilts, MS, ACSM CES

## 2013-01-14 ENCOUNTER — Encounter (HOSPITAL_COMMUNITY)
Admission: RE | Admit: 2013-01-14 | Discharge: 2013-01-14 | Disposition: A | Discharge status: Home / Self Care | Payer: PRIVATE HEALTH INSURANCE | Source: Ambulatory Visit | Attending: Cardiovascular Disease | Admitting: Cardiovascular Disease

## 2013-01-14 NOTE — Progress Notes (Signed)
Nutrition Note Spoke with pt. Pt reports he has had kidney stones in the past and he is concerned that he is eating foods now that may be causing a kidney stone. Nutrition Therapy for Kidney Stones discussed. Pt expressed understanding. Continue client-centered nutrition education by RD as part of interdisciplinary care.  Monitor and evaluate progress toward nutrition goal with team.  Mickle Plumb, M.Ed, RD, LDN, CDE 01/14/2013 3:48 PM

## 2013-01-16 ENCOUNTER — Encounter (HOSPITAL_COMMUNITY)
Admission: RE | Admit: 2013-01-16 | Discharge: 2013-01-16 | Disposition: A | Discharge status: Home / Self Care | Payer: PRIVATE HEALTH INSURANCE | Source: Ambulatory Visit | Attending: Cardiovascular Disease | Admitting: Cardiovascular Disease

## 2013-01-19 ENCOUNTER — Encounter (HOSPITAL_COMMUNITY)
Admission: RE | Admit: 2013-01-19 | Discharge: 2013-01-19 | Disposition: A | Discharge status: Home / Self Care | Payer: PRIVATE HEALTH INSURANCE | Source: Ambulatory Visit | Attending: Cardiovascular Disease | Admitting: Cardiovascular Disease

## 2013-01-21 ENCOUNTER — Encounter (HOSPITAL_COMMUNITY)
Admission: RE | Admit: 2013-01-21 | Discharge: 2013-01-21 | Disposition: A | Discharge status: Home / Self Care | Payer: PRIVATE HEALTH INSURANCE | Source: Ambulatory Visit | Attending: Cardiovascular Disease | Admitting: Cardiovascular Disease

## 2013-01-23 ENCOUNTER — Ambulatory Visit (INDEPENDENT_AMBULATORY_CARE_PROVIDER_SITE_OTHER): Payer: PRIVATE HEALTH INSURANCE

## 2013-01-23 ENCOUNTER — Encounter (HOSPITAL_COMMUNITY)
Admission: RE | Admit: 2013-01-23 | Discharge: 2013-01-23 | Disposition: A | Discharge status: Home / Self Care | Payer: PRIVATE HEALTH INSURANCE | Source: Ambulatory Visit | Attending: Cardiovascular Disease | Admitting: Cardiovascular Disease

## 2013-01-23 DIAGNOSIS — Z5189 Encounter for other specified aftercare: Secondary | ICD-10-CM | POA: Insufficient documentation

## 2013-01-23 DIAGNOSIS — E785 Hyperlipidemia, unspecified: Secondary | ICD-10-CM

## 2013-01-23 DIAGNOSIS — Z9861 Coronary angioplasty status: Secondary | ICD-10-CM | POA: Insufficient documentation

## 2013-01-23 DIAGNOSIS — I2119 ST elevation (STEMI) myocardial infarction involving other coronary artery of inferior wall: Secondary | ICD-10-CM | POA: Insufficient documentation

## 2013-01-23 DIAGNOSIS — I2510 Atherosclerotic heart disease of native coronary artery without angina pectoris: Secondary | ICD-10-CM | POA: Insufficient documentation

## 2013-01-26 ENCOUNTER — Encounter (HOSPITAL_COMMUNITY): Payer: PRIVATE HEALTH INSURANCE

## 2013-01-26 ENCOUNTER — Telehealth: Payer: Self-pay | Admitting: Cardiovascular Disease

## 2013-01-26 LAB — HEPATIC FUNCTION PANEL
ALT: 38 U/L (ref 0–53)
AST: 39 U/L — ABNORMAL HIGH (ref 0–37)
Albumin: 4.9 g/dL (ref 3.5–5.2)
Alkaline Phosphatase: 69 U/L (ref 39–117)
Bilirubin, Direct: 0.1 mg/dL (ref 0.0–0.3)
Total Bilirubin: 0.9 mg/dL (ref 0.3–1.2)
Total Protein: 8.4 g/dL — ABNORMAL HIGH (ref 6.0–8.3)

## 2013-01-26 LAB — LIPID PANEL
Cholesterol: 181 mg/dL (ref 0–200)
HDL: 34.8 mg/dL — ABNORMAL LOW (ref >39.00)
LDL Cholesterol: 125 mg/dL — ABNORMAL HIGH (ref 0–99)
Total CHOL/HDL Ratio: 5
Triglycerides: 108 mg/dL (ref 0.0–149.0)
VLDL: 21.6 mg/dL (ref 0.0–40.0)

## 2013-01-26 NOTE — Telephone Encounter (Signed)
Spoke with patient who c/o fever, cold s/s, malaise.  Patient wondering what he could take for his symptoms; states he was coughing over the weekend but cough is better.  Patient instructed that he can take Tylenol for fever reduction and Mucinex for relief of cold s/s. Patient also c/o joint pain that started Friday night.  States at different times this weekend his shoulder, bicep and knee have hurt him and wants to know if it is related to the Zetia. Patient states he is unable to exercise with knee like it is now.  Patient stopped taking Zetia on Friday. Patient questions the results of his labs which were drawn on Friday; results are not available yet.  Patient wondering if he needs to have his muscle enzymes checked in relation to Zetia.  Routing to Dr. Clifton James for advice.  I informed patient that I would call him back with lab results if they are back today and that CM is not in office today.

## 2013-01-26 NOTE — Telephone Encounter (Signed)
New Prob    Pt has some questions regarding his medications (ZETIA) & thinks some may be causing him joint pain. Pt is also states he has a low grade fever and wants to know what he can take for it. Please call.

## 2013-01-27 NOTE — Telephone Encounter (Signed)
Frederick Bible, His labs were ordered by Marcelino Duster and they did not come to me. Can we let him know that his LFTs are within range. Lipids are acceptable. His Zetia should not cause muscle pain as it is not a statin but reasonable to hold for a few weeks to see if his muscle pain resolves. He is intolerant of statins. Thanks,chris

## 2013-01-27 NOTE — Telephone Encounter (Signed)
Spoke with pt and gave him information from Dr. Clifton James. He will stop Zetia to see if this helps with joint pain. He will resume if no change in pain after stopping and will then follow up with his primary MD to have joint pain evaluated. He has appt with Dr. Clifton James on Feb 18, 2013 at 3:00.

## 2013-01-28 ENCOUNTER — Other Ambulatory Visit: Payer: Self-pay | Admitting: *Deleted

## 2013-01-28 ENCOUNTER — Encounter (HOSPITAL_COMMUNITY): Payer: PRIVATE HEALTH INSURANCE

## 2013-01-28 DIAGNOSIS — I10 Essential (primary) hypertension: Secondary | ICD-10-CM

## 2013-01-28 MED ORDER — LOSARTAN POTASSIUM 25 MG PO TABS: 25.0000 mg | ORAL_TABLET | Freq: Every day | ORAL | Status: DC

## 2013-01-30 ENCOUNTER — Encounter (HOSPITAL_COMMUNITY): Payer: PRIVATE HEALTH INSURANCE

## 2013-02-02 ENCOUNTER — Encounter (HOSPITAL_COMMUNITY)
Admission: RE | Admit: 2013-02-02 | Discharge: 2013-02-02 | Disposition: A | Discharge status: Home / Self Care | Payer: PRIVATE HEALTH INSURANCE | Source: Ambulatory Visit | Attending: Cardiovascular Disease | Admitting: Cardiovascular Disease

## 2013-02-04 ENCOUNTER — Encounter (HOSPITAL_COMMUNITY)
Admission: RE | Admit: 2013-02-04 | Discharge: 2013-02-04 | Disposition: A | Discharge status: Home / Self Care | Payer: PRIVATE HEALTH INSURANCE | Source: Ambulatory Visit | Attending: Cardiovascular Disease | Admitting: Cardiovascular Disease

## 2013-02-06 ENCOUNTER — Encounter (HOSPITAL_COMMUNITY)
Admission: RE | Admit: 2013-02-06 | Discharge: 2013-02-06 | Disposition: A | Discharge status: Home / Self Care | Payer: PRIVATE HEALTH INSURANCE | Source: Ambulatory Visit | Attending: Cardiovascular Disease | Admitting: Cardiovascular Disease

## 2013-02-09 ENCOUNTER — Encounter (HOSPITAL_COMMUNITY)
Admission: RE | Admit: 2013-02-09 | Discharge: 2013-02-09 | Disposition: A | Discharge status: Home / Self Care | Payer: PRIVATE HEALTH INSURANCE | Source: Ambulatory Visit | Attending: Cardiovascular Disease | Admitting: Cardiovascular Disease

## 2013-02-11 ENCOUNTER — Encounter (HOSPITAL_COMMUNITY): Payer: PRIVATE HEALTH INSURANCE

## 2013-02-13 ENCOUNTER — Encounter (HOSPITAL_COMMUNITY)
Admission: RE | Admit: 2013-02-13 | Discharge: 2013-02-13 | Disposition: A | Discharge status: Home / Self Care | Payer: PRIVATE HEALTH INSURANCE | Source: Ambulatory Visit | Attending: Cardiovascular Disease | Admitting: Cardiovascular Disease

## 2013-02-16 ENCOUNTER — Encounter (HOSPITAL_COMMUNITY): Payer: PRIVATE HEALTH INSURANCE

## 2013-02-18 ENCOUNTER — Encounter (HOSPITAL_COMMUNITY): Payer: PRIVATE HEALTH INSURANCE

## 2013-02-18 ENCOUNTER — Encounter: Payer: Self-pay | Admitting: Cardiovascular Disease

## 2013-02-18 ENCOUNTER — Ambulatory Visit (INDEPENDENT_AMBULATORY_CARE_PROVIDER_SITE_OTHER): Payer: PRIVATE HEALTH INSURANCE | Admitting: Cardiovascular Disease

## 2013-02-18 VITALS — BP 110/80 | HR 60 | Resp 12 | Ht 69.0 in | Wt 220.8 lb

## 2013-02-18 DIAGNOSIS — I2510 Atherosclerotic heart disease of native coronary artery without angina pectoris: Secondary | ICD-10-CM

## 2013-02-18 NOTE — Progress Notes (Signed)
History of Present Illness: 52 yo male with history of CAD, HTN, HLD who was admitted to [facility] 12/13/12 with an inferior STEMI. The RCA was occluded distally. I placed a 2.25 x 12 mm Promus Premier DES in the posterolateral branch and a 3.5 x 12 mm Promus Premier DES in the distal RCA. He was seen here 53/02/14 by Herma Carson, PA-C and had c/o fluttering. 48 hour monitor with rare PACs.     Primary Care Physician: None  Last Lipid Profile:Lipid Panel     Component Value Date/Time   CHOL 181 01/23/2013 0749   TRIG 108.0 01/23/2013 0749   HDL 34.80* 01/23/2013 0749   CHOLHDL 5 01/23/2013 0749   VLDL 21.6 01/23/2013 0749   LDLCALC 125* 01/23/2013 0749     Past Medical History  Diagnosis Date  . HTN (hypertension)   . Hyperlipidemia   . CAD (coronary artery disease)     a. 11/2012 Acute Inf STEMI/Cath/PCI: LM nl, LAD nl, LCX 30p, OM1 30p, RCA 20p, 78m, 95d (3.5x12 promus premier DES), PDA 46m (2.69mm vessel), RPL 100 (2.25x12 promus premier DES), EF 60%;  b. 11/2012 Echo: EF 55%, mild LVH.    Past Surgical History  Procedure Laterality Date  . None      Current Outpatient Prescriptions  Medication Sig Dispense Refill  . aspirin 81 MG tablet Take 81 mg by mouth daily.      Marland Kitchen ezetimibe (ZETIA) 10 MG tablet Take 1 tablet (10 mg total) by mouth daily.  90 tablet  1  . losartan (COZAAR) 25 MG tablet Take 1 tablet (25 mg total) by mouth daily.  90 tablet  1  . metoprolol tartrate (LOPRESSOR) 25 MG tablet Take 1 tablet (25 mg total) by mouth 2 (two) times daily.  180 tablet  1  . nitroGLYCERIN (NITROSTAT) 0.4 MG SL tablet Place 1 tablet (0.4 mg total) under the tongue every 5 (five) minutes as needed for chest pain.  25 tablet  3  . prasugrel (EFFIENT) 10 MG TABS Take 1 tablet (10 mg total) by mouth daily.  90 tablet  1   No current facility-administered medications for this visit.    Allergies  Allergen Reactions  . Lisinopril     cough  . Other     Mushrooms - GI Problems  . Statins    Myalgias     History   Social History  . Marital Status: Single    Spouse Name: N/A    Number of Children: N/A  . Years of Education: N/A   Occupational History  . Works in [employer]    Social History Main Topics  . Smoking status: Never Smoker   . Smokeless tobacco: Not on file  . Alcohol Use: Yes  . Drug Use: No  . Sexually Active: Not on file   Other Topics Concern  . Not on file   Social History Narrative  . No narrative on file    Family History  Problem Relation Age of Onset  . CAD Paternal Uncle     Review of Systems:  As stated in the HPI and otherwise negative.   BP 110/80  Pulse 60  Ht 5\' 9"  (1.753 m)  Wt 220 lb 12.8 oz (100.154 kg)  BMI 32.59 kg/m2  Physical Examination: General: Well developed, well nourished, NAD HEENT: OP clear, mucus membranes moist SKIN: warm, dry. No rashes. Neuro: No focal deficits Musculoskeletal: Muscle strength 5/5 all ext Psychiatric: Mood and affect normal Neck: No  JVD, no carotid bruits, no thyromegaly, no lymphadenopathy. Lungs:Clear bilaterally, no wheezes, rhonci, crackles Cardiovascular: Regular rate and rhythm. No murmurs, gallops or rubs. Abdomen:Soft. Bowel sounds present. Non-tender.  Extremities: No lower extremity edema. Pulses are 2 + in the bilateral DP/PT.   Cardiac cath 12/13/12: Left main: No obstructive disease.  Left Anterior Descending Artery: Large caliber vessel that courses to the apex. Mild diffuse plaque in the proximal, mid and distal segments.  Circumflex Artery: Moderate caliber vessel with moderate caliber first obtuse marginal branch. The proximal Circumflex and the proximal segment of the OM branch have 30% stenosis. Small to moderate caliber intermediate branch.  Right Coronary Artery: Large, dominant vessel with diffuse 20% stenosis proximal vessel, 40% stenosis mid vessel, 95% hazy stenosis distal vessel. The PDA is small in caliber (2.0 mm) and has a 95% mid stenosis. The  posterolateral branch is moderate in caliber and has a 100% total occlusion.  Left Ventricular Angiogram: LVEF 60%  PCI: The RCA was engaged with a JR4 guide. The patient was given a bolus of Angiomax and a drip was started. He was given Effient 60 mg po x 1. A BMW wire was passed down the RCA into the posterolateral branch but I could not get this wire past the total occlusion so a Whisper wire was passed distally. A 2.0 x 8 mm balloon was used to open the totally occluded posterolateral branch. I then carefully positioned and deployed a 2.25 x 12 mm Promus Premier DES in the posterolateral branch. This was post-dilated with a 2.25 x 8 mm [place] balloon. I then turned by attention to the severe stenosis in the distal RCA. I elected to treat the distal RCA lesion today as it was severe, hazy and could limit inflow into the newly placed more distal stent. The distal RCA stenosis was directly stented with a 3.5 x 12 mm Promus Premier DES. This stent was post-dilated with a 3.5 x 8 mm [place] balloon. There was an excellent result.   Assessment and Plan:   1. CAD: Stable post inferior MI. He will need dual anti-platelet therapy with ASA and Effient for one year. Continue beta blocker, ARB. He does not tolerate statins.

## 2013-02-18 NOTE — Patient Instructions (Addendum)
Your physician wants you to follow-up in: 6 months  You will receive a reminder letter in the mail two months in advance. If you don't receive a letter, please call our office to schedule the follow-up appointment.  Your physician recommends that you continue on your current medications as directed. Please refer to the Current Medication list given to you today.  

## 2013-02-20 ENCOUNTER — Encounter (HOSPITAL_COMMUNITY): Payer: PRIVATE HEALTH INSURANCE

## 2013-02-23 ENCOUNTER — Encounter (HOSPITAL_COMMUNITY)
Admission: RE | Admit: 2013-02-23 | Discharge: 2013-02-23 | Disposition: A | Discharge status: Home / Self Care | Payer: PRIVATE HEALTH INSURANCE | Source: Ambulatory Visit | Attending: Cardiovascular Disease | Admitting: Cardiovascular Disease

## 2013-02-23 DIAGNOSIS — Z5189 Encounter for other specified aftercare: Secondary | ICD-10-CM | POA: Insufficient documentation

## 2013-02-23 DIAGNOSIS — I2510 Atherosclerotic heart disease of native coronary artery without angina pectoris: Secondary | ICD-10-CM | POA: Insufficient documentation

## 2013-02-23 DIAGNOSIS — Z9861 Coronary angioplasty status: Secondary | ICD-10-CM | POA: Insufficient documentation

## 2013-02-23 DIAGNOSIS — I2119 ST elevation (STEMI) myocardial infarction involving other coronary artery of inferior wall: Secondary | ICD-10-CM | POA: Insufficient documentation

## 2013-02-25 ENCOUNTER — Encounter (HOSPITAL_COMMUNITY)
Admission: RE | Admit: 2013-02-25 | Discharge: 2013-02-25 | Disposition: A | Discharge status: Home / Self Care | Payer: PRIVATE HEALTH INSURANCE | Source: Ambulatory Visit | Attending: Cardiovascular Disease | Admitting: Cardiovascular Disease

## 2013-02-27 ENCOUNTER — Encounter (HOSPITAL_COMMUNITY)
Admission: RE | Admit: 2013-02-27 | Discharge: 2013-02-27 | Disposition: A | Discharge status: Home / Self Care | Payer: PRIVATE HEALTH INSURANCE | Source: Ambulatory Visit | Attending: Cardiovascular Disease | Admitting: Cardiovascular Disease

## 2013-02-27 NOTE — Progress Notes (Signed)
Frederick Baker came to cardiac rehab and said he forgot to take all of his medications this morning. I advised Frederick Baker not exercise today and to go home and take his medications.

## 2013-03-02 ENCOUNTER — Encounter (HOSPITAL_COMMUNITY)
Admission: RE | Admit: 2013-03-02 | Discharge: 2013-03-02 | Disposition: A | Discharge status: Home / Self Care | Payer: PRIVATE HEALTH INSURANCE | Source: Ambulatory Visit | Attending: Cardiovascular Disease | Admitting: Cardiovascular Disease

## 2013-03-02 NOTE — Progress Notes (Signed)
Charlton Haws 52 y.o. male Nutrition Note Spoke with pt.  Nutrition Plan and Nutrition Survey goals reviewed with pt. Pt is following Step 2 of the Therapeutic Lifestyle Changes diet. Pt wants to lose wt. Pt has been trying to lose wt by decreasing fatty foods and portion sizes. Pt is exercising "20-min or so" on the days he is not in rehab. Pt states he weighed 239 lb before his heart event. Pt wt today reportedly 216.9 lb (98.6 kg). Pt wt is down 22.1 lb from UBW. Pt feels his wt loss efforts have plateaued. Wt loss tips reviewed. Pt expressed understanding of the information reviewed. Pt aware of nutrition education classes offered and is unable to attend nutrition classes.  Nutrition Diagnosis   Food-and nutrition-related knowledge deficit related to lack of exposure to information as related to diagnosis of: ? CVD    Obesity related to excessive energy intake as evidenced by a BMI of 33.9  Nutrition RX/ Estimated Daily Nutrition Needs for: wt loss  1600-2100 Kcal, 45-55 gm fat, 12-17 gm sat fat, 1.8-2.4 gm trans-fat, <1500 mg sodium   Nutrition Intervention   Pt's individual nutrition plan including cholesterol goals reviewed with pt.   Benefits of adopting Therapeutic Lifestyle Changes discussed when Medficts reviewed.   Pt to attend the Portion Distortion class - met 01/14/13   Pt given handouts for: ? Nutrition I class ? Nutrition II class ? 5-day 1800 kcal menu ideas   Continue client-centered nutrition education by RD, as part of interdisciplinary care.  Goal(s)   Pt to identify food quantities necessary to achieve: ? wt loss to a goal wt of 205-223 lb (93.3-101.5 kg) at graduation from cardiac rehab.   Monitor and Evaluate progress toward nutrition goal with team. Nutrition Risk:  Low   Mickle Plumb, M.Ed, RD, LDN, CDE 03/02/2013 3:35 PM

## 2013-03-04 ENCOUNTER — Encounter (HOSPITAL_COMMUNITY)
Admission: RE | Admit: 2013-03-04 | Discharge: 2013-03-04 | Disposition: A | Discharge status: Home / Self Care | Payer: PRIVATE HEALTH INSURANCE | Source: Ambulatory Visit | Attending: Cardiovascular Disease | Admitting: Cardiovascular Disease

## 2013-03-06 ENCOUNTER — Encounter (HOSPITAL_COMMUNITY)
Admission: RE | Admit: 2013-03-06 | Discharge: 2013-03-06 | Disposition: A | Discharge status: Home / Self Care | Payer: PRIVATE HEALTH INSURANCE | Source: Ambulatory Visit | Attending: Cardiovascular Disease | Admitting: Cardiovascular Disease

## 2013-03-09 ENCOUNTER — Encounter (HOSPITAL_COMMUNITY): Payer: PRIVATE HEALTH INSURANCE

## 2013-03-11 ENCOUNTER — Encounter (HOSPITAL_COMMUNITY): Payer: PRIVATE HEALTH INSURANCE

## 2013-03-13 ENCOUNTER — Encounter (HOSPITAL_COMMUNITY): Payer: PRIVATE HEALTH INSURANCE

## 2013-03-16 ENCOUNTER — Encounter (HOSPITAL_COMMUNITY): Payer: PRIVATE HEALTH INSURANCE

## 2013-03-18 ENCOUNTER — Encounter (HOSPITAL_COMMUNITY)
Admission: RE | Admit: 2013-03-18 | Discharge: 2013-03-18 | Disposition: A | Discharge status: Home / Self Care | Payer: PRIVATE HEALTH INSURANCE | Source: Ambulatory Visit | Attending: Cardiovascular Disease | Admitting: Cardiovascular Disease

## 2013-03-20 ENCOUNTER — Encounter (HOSPITAL_COMMUNITY)
Admission: RE | Admit: 2013-03-20 | Discharge: 2013-03-20 | Disposition: A | Discharge status: Home / Self Care | Payer: PRIVATE HEALTH INSURANCE | Source: Ambulatory Visit | Attending: Cardiovascular Disease | Admitting: Cardiovascular Disease

## 2013-03-23 ENCOUNTER — Encounter (HOSPITAL_COMMUNITY)
Admission: RE | Admit: 2013-03-23 | Discharge: 2013-03-23 | Disposition: A | Discharge status: Home / Self Care | Payer: PRIVATE HEALTH INSURANCE | Source: Ambulatory Visit | Attending: Cardiovascular Disease | Admitting: Cardiovascular Disease

## 2013-03-25 ENCOUNTER — Encounter (HOSPITAL_COMMUNITY): Payer: PRIVATE HEALTH INSURANCE

## 2013-03-27 ENCOUNTER — Encounter (HOSPITAL_COMMUNITY): Payer: PRIVATE HEALTH INSURANCE

## 2013-03-30 ENCOUNTER — Encounter (HOSPITAL_COMMUNITY)
Admission: RE | Admit: 2013-03-30 | Discharge: 2013-03-30 | Disposition: A | Discharge status: Home / Self Care | Payer: PRIVATE HEALTH INSURANCE | Source: Ambulatory Visit | Attending: Cardiovascular Disease | Admitting: Cardiovascular Disease

## 2013-03-30 DIAGNOSIS — Z9861 Coronary angioplasty status: Secondary | ICD-10-CM | POA: Insufficient documentation

## 2013-03-30 DIAGNOSIS — I2119 ST elevation (STEMI) myocardial infarction involving other coronary artery of inferior wall: Secondary | ICD-10-CM | POA: Insufficient documentation

## 2013-03-30 DIAGNOSIS — Z5189 Encounter for other specified aftercare: Secondary | ICD-10-CM | POA: Insufficient documentation

## 2013-03-30 DIAGNOSIS — I2510 Atherosclerotic heart disease of native coronary artery without angina pectoris: Secondary | ICD-10-CM | POA: Insufficient documentation

## 2013-04-01 ENCOUNTER — Encounter (HOSPITAL_COMMUNITY)
Admission: RE | Admit: 2013-04-01 | Discharge: 2013-04-01 | Disposition: A | Discharge status: Home / Self Care | Payer: PRIVATE HEALTH INSURANCE | Source: Ambulatory Visit | Attending: Cardiovascular Disease | Admitting: Cardiovascular Disease

## 2013-04-03 ENCOUNTER — Encounter (HOSPITAL_COMMUNITY): Payer: PRIVATE HEALTH INSURANCE

## 2013-04-06 ENCOUNTER — Encounter (HOSPITAL_COMMUNITY): Payer: PRIVATE HEALTH INSURANCE

## 2013-04-08 ENCOUNTER — Encounter (HOSPITAL_COMMUNITY)
Admission: RE | Admit: 2013-04-08 | Discharge: 2013-04-08 | Disposition: A | Discharge status: Home / Self Care | Payer: PRIVATE HEALTH INSURANCE | Source: Ambulatory Visit | Attending: Cardiovascular Disease | Admitting: Cardiovascular Disease

## 2013-04-10 ENCOUNTER — Encounter (HOSPITAL_COMMUNITY): Payer: PRIVATE HEALTH INSURANCE

## 2013-04-13 ENCOUNTER — Encounter (HOSPITAL_COMMUNITY)
Admission: RE | Admit: 2013-04-13 | Discharge: 2013-04-13 | Disposition: A | Discharge status: Home / Self Care | Payer: PRIVATE HEALTH INSURANCE | Source: Ambulatory Visit | Attending: Cardiovascular Disease | Admitting: Cardiovascular Disease

## 2013-04-15 ENCOUNTER — Encounter (HOSPITAL_COMMUNITY)
Admission: RE | Admit: 2013-04-15 | Discharge: 2013-04-15 | Disposition: A | Discharge status: Home / Self Care | Payer: PRIVATE HEALTH INSURANCE | Source: Ambulatory Visit | Attending: Cardiovascular Disease | Admitting: Cardiovascular Disease

## 2013-04-17 ENCOUNTER — Encounter (HOSPITAL_COMMUNITY)
Admission: RE | Admit: 2013-04-17 | Discharge: 2013-04-17 | Disposition: A | Discharge status: Home / Self Care | Payer: PRIVATE HEALTH INSURANCE | Source: Ambulatory Visit | Attending: Cardiovascular Disease | Admitting: Cardiovascular Disease

## 2013-04-20 ENCOUNTER — Encounter (HOSPITAL_COMMUNITY)
Admission: RE | Admit: 2013-04-20 | Discharge: 2013-04-20 | Disposition: A | Discharge status: Home / Self Care | Payer: PRIVATE HEALTH INSURANCE | Source: Ambulatory Visit | Attending: Cardiovascular Disease | Admitting: Cardiovascular Disease

## 2013-04-22 ENCOUNTER — Encounter (HOSPITAL_COMMUNITY)
Admission: RE | Admit: 2013-04-22 | Discharge: 2013-04-22 | Disposition: A | Discharge status: Home / Self Care | Payer: PRIVATE HEALTH INSURANCE | Source: Ambulatory Visit | Attending: Cardiovascular Disease | Admitting: Cardiovascular Disease

## 2013-04-24 ENCOUNTER — Encounter (HOSPITAL_COMMUNITY)
Admission: RE | Admit: 2013-04-24 | Discharge: 2013-04-24 | Disposition: A | Discharge status: Home / Self Care | Payer: PRIVATE HEALTH INSURANCE | Source: Ambulatory Visit | Attending: Cardiovascular Disease | Admitting: Cardiovascular Disease

## 2013-04-24 DIAGNOSIS — Z5189 Encounter for other specified aftercare: Secondary | ICD-10-CM | POA: Insufficient documentation

## 2013-04-24 DIAGNOSIS — Z9861 Coronary angioplasty status: Secondary | ICD-10-CM | POA: Insufficient documentation

## 2013-04-24 DIAGNOSIS — I2119 ST elevation (STEMI) myocardial infarction involving other coronary artery of inferior wall: Secondary | ICD-10-CM | POA: Insufficient documentation

## 2013-04-24 DIAGNOSIS — I2510 Atherosclerotic heart disease of native coronary artery without angina pectoris: Secondary | ICD-10-CM | POA: Insufficient documentation

## 2013-04-27 ENCOUNTER — Encounter (HOSPITAL_COMMUNITY)
Admission: RE | Admit: 2013-04-27 | Discharge: 2013-04-27 | Disposition: A | Discharge status: Home / Self Care | Payer: PRIVATE HEALTH INSURANCE | Source: Ambulatory Visit | Attending: Cardiovascular Disease | Admitting: Cardiovascular Disease

## 2013-04-29 ENCOUNTER — Encounter (HOSPITAL_COMMUNITY)
Admission: RE | Admit: 2013-04-29 | Discharge: 2013-04-29 | Disposition: A | Discharge status: Home / Self Care | Payer: PRIVATE HEALTH INSURANCE | Source: Ambulatory Visit | Attending: Cardiovascular Disease | Admitting: Cardiovascular Disease

## 2013-05-01 ENCOUNTER — Encounter (HOSPITAL_COMMUNITY)
Admission: RE | Admit: 2013-05-01 | Discharge: 2013-05-01 | Disposition: A | Discharge status: Home / Self Care | Payer: PRIVATE HEALTH INSURANCE | Source: Ambulatory Visit | Attending: Cardiovascular Disease | Admitting: Cardiovascular Disease

## 2013-05-04 ENCOUNTER — Encounter (HOSPITAL_COMMUNITY)
Admission: RE | Admit: 2013-05-04 | Discharge: 2013-05-04 | Disposition: A | Discharge status: Home / Self Care | Payer: PRIVATE HEALTH INSURANCE | Source: Ambulatory Visit | Attending: Cardiovascular Disease | Admitting: Cardiovascular Disease

## 2013-05-06 ENCOUNTER — Encounter (HOSPITAL_COMMUNITY)
Admission: RE | Admit: 2013-05-06 | Discharge: 2013-05-06 | Disposition: A | Discharge status: Home / Self Care | Payer: PRIVATE HEALTH INSURANCE | Source: Ambulatory Visit | Attending: Cardiovascular Disease | Admitting: Cardiovascular Disease

## 2013-05-06 ENCOUNTER — Telehealth: Payer: Self-pay | Admitting: *Deleted

## 2013-05-06 NOTE — Progress Notes (Signed)
Frederick Baker today.  Frederick Baker plans to continue exercise on his own.  Frederick Baker has a treadmill and an elliptical at home. Frederick Baker plans to join a gym.

## 2013-05-06 NOTE — Telephone Encounter (Signed)
Received call from [facility] at Cardiac Rehab who is asking if we have any samples of Zetia and Effient pt could have.  I told her I would check and call her back.  Effient 10 mg, 21 tablets, exp 8/15, lot [id number] and Zetia 10 mg, 42 tablets, exp. 6/16, Lot [id number] left at front desk. I called and left message at rehab that samples would be in office for pt to pick up.

## 2013-05-08 ENCOUNTER — Encounter (HOSPITAL_COMMUNITY): Payer: PRIVATE HEALTH INSURANCE

## 2013-05-11 ENCOUNTER — Encounter (HOSPITAL_COMMUNITY): Payer: PRIVATE HEALTH INSURANCE

## 2013-05-13 ENCOUNTER — Encounter (HOSPITAL_COMMUNITY): Payer: PRIVATE HEALTH INSURANCE

## 2013-05-14 ENCOUNTER — Telehealth: Payer: Self-pay | Admitting: Cardiovascular Disease

## 2013-05-14 NOTE — Telephone Encounter (Signed)
I called and spoke with patient and he is feeling sluggish and strange this morning.  He feels he is having to concentrate on taking a deep breath.  While walking around he feels he needs to take a deep breath, denies CP or nausea, but has a fluttering in chest,.  He has been like this since he got to work.  His normal work is making him tired.  He did the same thing yesterday and felt okay.  It is hot in the shop. He has eaten lunch and feels some better now but is sitting doing computer work.

## 2013-05-14 NOTE — Telephone Encounter (Signed)
New Problem  Pt called requesting a random BP Check// States he doesn't feel well and he is at work//

## 2013-05-14 NOTE — Telephone Encounter (Signed)
Discussed with Dr Clifton James he suggest the patient stayed hydrated and call us back if he continues to feel bad.  I have called patient with doctors recommendations and he says that he will do so and agrees he may not have been drinking as much water as he should have been.  He will call back if he doesn't improve.  He also states that this could be in his head due to all he has been through

## 2013-05-15 ENCOUNTER — Encounter (HOSPITAL_COMMUNITY): Admission: RE | Admit: 2013-05-15 | Payer: PRIVATE HEALTH INSURANCE | Source: Ambulatory Visit

## 2013-06-25 ENCOUNTER — Telehealth: Payer: Self-pay | Admitting: Cardiovascular Disease

## 2013-06-25 NOTE — Telephone Encounter (Signed)
pt requesting effient and zetia samples

## 2013-06-26 NOTE — Telephone Encounter (Signed)
Effient 10 mg, 28 tablets, Lot [id number], exp 6/15 and Zetia 10 mg, 42 tablets, lot [id number], exp 12/16 left at front desk for pt to pick up. Pt aware.

## 2013-07-27 ENCOUNTER — Telehealth: Payer: Self-pay

## 2013-07-27 NOTE — Telephone Encounter (Signed)
Patient called wanting samples of zetia and effient , put samples up front

## 2013-08-11 ENCOUNTER — Other Ambulatory Visit: Payer: Self-pay | Admitting: Cardiovascular Disease

## 2013-08-11 ENCOUNTER — Other Ambulatory Visit: Payer: Self-pay | Admitting: Physician Assistant

## 2013-08-12 ENCOUNTER — Other Ambulatory Visit: Payer: Self-pay

## 2013-08-12 MED ORDER — METOPROLOL TARTRATE 25 MG PO TABS: 25.0000 mg | ORAL_TABLET | Freq: Two times a day (BID) | ORAL | Status: DC

## 2013-08-18 ENCOUNTER — Ambulatory Visit (INDEPENDENT_AMBULATORY_CARE_PROVIDER_SITE_OTHER): Payer: PRIVATE HEALTH INSURANCE | Admitting: Cardiovascular Disease

## 2013-08-18 ENCOUNTER — Encounter: Payer: Self-pay | Admitting: Cardiovascular Disease

## 2013-08-18 ENCOUNTER — Telehealth: Payer: Self-pay | Admitting: *Deleted

## 2013-08-18 VITALS — BP 120/72 | HR 59 | Ht 69.0 in | Wt 223.0 lb

## 2013-08-18 DIAGNOSIS — I10 Essential (primary) hypertension: Secondary | ICD-10-CM

## 2013-08-18 DIAGNOSIS — R002 Palpitations: Secondary | ICD-10-CM

## 2013-08-18 DIAGNOSIS — I2510 Atherosclerotic heart disease of native coronary artery without angina pectoris: Secondary | ICD-10-CM

## 2013-08-18 DIAGNOSIS — E785 Hyperlipidemia, unspecified: Secondary | ICD-10-CM

## 2013-08-18 NOTE — Progress Notes (Signed)
History of Present Illness: 52 yo male with history of CAD, HTN, HLD who was admitted to [facility] 12/13/12 with an inferior STEMI. The RCA was occluded distally. I placed a 2.25 x 12 mm Promus Premier DES in the posterolateral branch and a 3.5 x 12 mm Promus Premier DES in the distal RCA. Echo 12/15/12 with LVEF=55% post inferior MI with inferobasal hypokinesis.   He is here today for follow up. He has been feeling well. No chest pains. No SOB. He feels palpitations nearly every day. He is known to have PVCs on event monitor April 2014. Palpitations seem to worsen after alcohol intake. No prolonged episodes.    Primary Care Physician: Synetta F[facility])  Last Lipid Profile:Lipid Panel     Component Value Date/Time   CHOL 181 01/23/2013 0749   TRIG 108.0 01/23/2013 0749   HDL 34.80* 01/23/2013 0749   CHOLHDL 5 01/23/2013 0749   VLDL 21.6 01/23/2013 0749   LDLCALC 125* 01/23/2013 0749     Past Medical History  Diagnosis Date  . HTN (hypertension)   . Hyperlipidemia   . CAD (coronary artery disease)     a. 11/2012 Acute Inf STEMI/Cath/PCI: LM nl, LAD nl, LCX 30p, OM1 30p, RCA 20p, 63m, 95d (3.5x12 promus premier DES), PDA 65m (2.72mm vessel), RPL 100 (2.25x12 promus premier DES), EF 60%;  b. 11/2012 Echo: EF 55%, mild LVH.    Past Surgical History  Procedure Laterality Date  . None      Current Outpatient Prescriptions  Medication Sig Dispense Refill  . aspirin 81 MG tablet Take 81 mg by mouth daily.      Marland Kitchen ezetimibe (ZETIA) 10 MG tablet Take 1 tablet (10 mg total) by mouth daily.  90 tablet  1  . losartan (COZAAR) 25 MG tablet TAKE 1 TABLET BY MOUTH DAILY.  90 tablet  0  . metoprolol tartrate (LOPRESSOR) 25 MG tablet Take 1 tablet (25 mg total) by mouth 2 (two) times daily.  180 tablet  1  . nitroGLYCERIN (NITROSTAT) 0.4 MG SL tablet Place 1 tablet (0.4 mg total) under the tongue every 5 (five) minutes as needed for chest pain.  25 tablet  3  . prasugrel (EFFIENT) 10 MG TABS Take 1 tablet  (10 mg total) by mouth daily.  90 tablet  1   No current facility-administered medications for this visit.    Allergies  Allergen Reactions  . Lisinopril     cough  . Other     Mushrooms - GI Problems  . Statins     Myalgias     History   Social History  . Marital Status: Single    Spouse Name: N/A    Number of Children: N/A  . Years of Education: N/A   Occupational History  . Works in [employer]    Social History Main Topics  . Smoking status: Never Smoker   . Smokeless tobacco: Not on file  . Alcohol Use: Yes  . Drug Use: No  . Sexual Activity: Not on file   Other Topics Concern  . Not on file   Social History Narrative  . No narrative on file    Family History  Problem Relation Age of Onset  . CAD Paternal Uncle     Review of Systems:  As stated in the HPI and otherwise negative.   BP 120/72  Pulse 59  Ht 5\' 9"  (1.753 m)  Wt 223 lb (101.152 kg)  BMI 32.92 kg/m2  SpO2 98%  Physical Examination: General: Well developed, well nourished, NAD HEENT: OP clear, mucus membranes moist SKIN: warm, dry. No rashes. Neuro: No focal deficits Musculoskeletal: Muscle strength 5/5 all ext Psychiatric: Mood and affect normal Neck: No JVD, no carotid bruits, no thyromegaly, no lymphadenopathy. Lungs:Clear bilaterally, no wheezes, rhonci, crackles Cardiovascular: Regular rate and rhythm. No murmurs, gallops or rubs. Abdomen:Soft. Bowel sounds present. Non-tender.  Extremities: No lower extremity edema. Pulses are 2 + in the bilateral DP/PT.  Cardiac cath 12/13/12: Left main: No obstructive disease.  Left Anterior Descending Artery: Large caliber vessel that courses to the apex. Mild diffuse plaque in the proximal, mid and distal segments.  Circumflex Artery: Moderate caliber vessel with moderate caliber first obtuse marginal branch. The proximal Circumflex and the proximal segment of the OM branch have 30% stenosis. Small to moderate caliber intermediate branch.    Right Coronary Artery: Large, dominant vessel with diffuse 20% stenosis proximal vessel, 40% stenosis mid vessel, 95% hazy stenosis distal vessel. The PDA is small in caliber (2.0 mm) and has a 95% mid stenosis. The posterolateral branch is moderate in caliber and has a 100% total occlusion.  Left Ventricular Angiogram: LVEF 60%  PCI: The RCA was engaged with a JR4 guide. The patient was given a bolus of Angiomax and a drip was started. He was given Effient 60 mg po x 1. A BMW wire was passed down the RCA into the posterolateral branch but I could not get this wire past the total occlusion so a Whisper wire was passed distally. A 2.0 x 8 mm balloon was used to open the totally occluded posterolateral branch. I then carefully positioned and deployed a 2.25 x 12 mm Promus Premier DES in the posterolateral branch. This was post-dilated with a 2.25 x 8 mm [place] balloon. I then turned by attention to the severe stenosis in the distal RCA. I elected to treat the distal RCA lesion today as it was severe, hazy and could limit inflow into the newly placed more distal stent. The distal RCA stenosis was directly stented with a 3.5 x 12 mm Promus Premier DES. This stent was post-dilated with a 3.5 x 8 mm [place] balloon. There was an excellent result.   Assessment and Plan:   1. CAD: Stable post inferior MI. He will need dual anti-platelet therapy with ASA and Effient for one year. Will likely change Effient to Plavix at next f/u visit in May 2015. Based on recent data from [facility], will need continued dual anti-platelet therapy even beyond one year.Continue beta blocker, ARB. He does not tolerate statins.   2. HTN: BP well controlled.   3. HLD: Continue Zetia. He does not tolerate statin.   4. Palpitations: Likely related to PVCs as seen on event monitor. Continue beta blocker.

## 2013-08-18 NOTE — Patient Instructions (Signed)
Your physician wants you to follow-up in:  6 months. You will receive a reminder letter in the mail two months in advance. If you don't receive a letter, please call our office to schedule the follow-up appointment.   

## 2013-08-18 NOTE — Telephone Encounter (Signed)
Patient requested samples of zetia and effient. Effient samples provided, but we are out of zetia.

## 2013-09-21 ENCOUNTER — Telehealth: Payer: Self-pay | Admitting: *Deleted

## 2013-09-21 NOTE — Telephone Encounter (Signed)
Patient requests zetia samples. Samples left at front desk for pick up.

## 2013-11-11 ENCOUNTER — Other Ambulatory Visit: Payer: Self-pay | Admitting: Cardiovascular Disease

## 2013-11-11 ENCOUNTER — Telehealth: Payer: Self-pay | Admitting: Cardiovascular Disease

## 2013-11-11 NOTE — Telephone Encounter (Signed)
Spoke with pt. He has just scheduled a 6 month follow up visit with Dr.McAlhany for late May. He states at last office visit Dr. Angelena Form talked with him about changing from Effient to Plavix at next office visit. Pt is asking about timing of this change. I told him Dr. Angelena Form would discuss possible change with him at office visit in May. Pt states he saw primary care recently and they would like to schedule him for colonoscopy. This is for screening. Pt is having no GI problems. I told pt he could not stop Effient and ASA for at least one year from date of stent placement in March 2014.  He will plan on waiting to have colonoscopy done this summer and will discuss timing of this at next office visit with Dr. Angelena Form. I offered him earlier appointment if he would like to be sooner than May to discuss but he would like to wait until May.

## 2013-11-11 NOTE — Telephone Encounter (Signed)
New problem   Pt has question concerning his medication being changed. Please call pt.

## 2013-12-17 ENCOUNTER — Other Ambulatory Visit: Payer: Self-pay | Admitting: Physician Assistant

## 2014-01-08 ENCOUNTER — Other Ambulatory Visit: Payer: Self-pay | Admitting: Physician Assistant

## 2014-02-09 ENCOUNTER — Other Ambulatory Visit: Payer: Self-pay | Admitting: Cardiovascular Disease

## 2014-02-17 ENCOUNTER — Telehealth: Payer: Self-pay | Admitting: Cardiovascular Disease

## 2014-02-17 NOTE — Telephone Encounter (Signed)
New problem   Pt need to know if he need to have a cholesterol check during his appt tomorrow.

## 2014-02-17 NOTE — Telephone Encounter (Signed)
The pt states that he had a Lipid check at Dr Royston Sinner office at the beginning of this year and wants to know if Dr Angelena Form wants to order another Lipid profile at his f/u tomorrow. I called Dr Royston Sinner office and asked them to fax [place] a copy of the pts lab results from January and was advised that they dont have those results.  I advised the pt and he states that he will contact that office and ask them to fax [place] a copy of his lab results, I gave him this office fax number. He states that he may have a copy at his home as well.  The pt is advised that if Dr Angelena Form wants to check his cholesterol he will order at [place] visit, he verbalized understanding.

## 2014-02-18 ENCOUNTER — Encounter: Payer: Self-pay | Admitting: Cardiovascular Disease

## 2014-02-18 ENCOUNTER — Ambulatory Visit (INDEPENDENT_AMBULATORY_CARE_PROVIDER_SITE_OTHER): Payer: PRIVATE HEALTH INSURANCE | Admitting: Cardiovascular Disease

## 2014-02-18 VITALS — BP 110/70 | HR 51 | Ht 69.0 in | Wt 220.0 lb

## 2014-02-18 DIAGNOSIS — E785 Hyperlipidemia, unspecified: Secondary | ICD-10-CM

## 2014-02-18 DIAGNOSIS — I2510 Atherosclerotic heart disease of native coronary artery without angina pectoris: Secondary | ICD-10-CM

## 2014-02-18 DIAGNOSIS — I10 Essential (primary) hypertension: Secondary | ICD-10-CM

## 2014-02-18 MED ORDER — ROSUVASTATIN CALCIUM 5 MG PO TABS: 5.0000 mg | ORAL_TABLET | ORAL | Status: DC

## 2014-02-18 MED ORDER — CLOPIDOGREL BISULFATE 75 MG PO TABS: 75.0000 mg | ORAL_TABLET | Freq: Every day | ORAL | Status: DC

## 2014-02-18 NOTE — Progress Notes (Signed)
History of Present Illness: 53 yo male with history of CAD, HTN, HLD who was admitted to [facility] 12/13/12 with an inferior STEMI. The RCA was occluded distally. I placed a 2.25 x 12 mm Promus Premier DES in the posterolateral branch and a 3.5 x 12 mm Promus Premier DES in the distal RCA. Echo 12/15/12 with LVEF=55% post inferior MI with inferobasal hypokinesis.   He is here today for follow up. He has been feeling well. No chest pains. No SOB. His wife left him several months ago. Now going through a divorce and he has been very upset about this.     Primary Care Physician: Tiana Loft [facility])  Last Lipid Profile:Lipid Panel     Component Value Date/Time   CHOL 181 01/23/2013 0749   TRIG 108.0 01/23/2013 0749   HDL 34.80* 01/23/2013 0749   CHOLHDL 5 01/23/2013 0749   VLDL 21.6 01/23/2013 0749   LDLCALC 125* 01/23/2013 0749     Past Medical History  Diagnosis Date  . HTN (hypertension)   . Hyperlipidemia   . CAD (coronary artery disease)     a. 11/2012 Acute Inf STEMI/Cath/PCI: LM nl, LAD nl, LCX 30p, OM1 30p, RCA 20p, 52m, 95d (3.5x12 promus premier DES), PDA 5m (2.71mm vessel), RPL 100 (2.25x12 promus premier DES), EF 60%;  b. 11/2012 Echo: EF 55%, mild LVH.    Past Surgical History  Procedure Laterality Date  . None      Current Outpatient Prescriptions  Medication Sig Dispense Refill  . aspirin 81 MG tablet Take 81 mg by mouth daily.      Marland Kitchen EFFIENT 10 MG TABS tablet TAKE 1 TABLET BY MOUTH EVERY DAY  90 tablet  0  . losartan (COZAAR) 25 MG tablet TAKE 1 TABLET BY MOUTH DAILY.  90 tablet  0  . metoprolol tartrate (LOPRESSOR) 25 MG tablet TAKE 1 TABLET BY MOUTH 2 TIMES DAILY.  180 tablet  0  . nitroGLYCERIN (NITROSTAT) 0.4 MG SL tablet Place 1 tablet (0.4 mg total) under the tongue every 5 (five) minutes as needed for chest pain.  25 tablet  3  . ZETIA 10 MG tablet TAKE 1 TABLET BY MOUTH EVERY DAY  90 tablet  0   No current facility-administered medications for this visit.     Allergies  Allergen Reactions  . Lisinopril     cough  . Other     Mushrooms - GI Problems  . Statins     Myalgias     History   Social History  . Marital Status: Single    Spouse Name: N/A    Number of Children: N/A  . Years of Education: N/A   Occupational History  . Works in [place]  . Smoking status: Never Smoker   . Smokeless tobacco: Not on file  . Alcohol Use: Yes  . Drug Use: No  . Sexual Activity: Not on file   Other Topics Concern  . Not on file   Social History Narrative  . No narrative on file    Family History  Problem Relation Age of Onset  . CAD Paternal Uncle     Review of Systems:  As stated in the HPI and otherwise negative.   BP 110/70  Pulse 51  Ht 5\' 9"  (1.753 m)  Wt 220 lb (99.791 kg)  BMI 32.47 kg/m2  Physical Examination: General: Well developed, well nourished, NAD HEENT: OP clear, mucus membranes moist  SKIN: warm, dry. No rashes. Neuro: No focal deficits Musculoskeletal: Muscle strength 5/5 all ext Psychiatric: Mood and affect normal Neck: No JVD, no carotid bruits, no thyromegaly, no lymphadenopathy. Lungs:Clear bilaterally, no wheezes, rhonci, crackles Cardiovascular: Regular rate and rhythm. No murmurs, gallops or rubs. Abdomen:Soft. Bowel sounds present. Non-tender.  Extremities: No lower extremity edema. Pulses are 2 + in the bilateral DP/PT.  EKG: Sinus brady, rate 51 bpm   Cardiac cath 12/13/12: Left main: No obstructive disease.  Left Anterior Descending Artery: Large caliber vessel that courses to the apex. Mild diffuse plaque in the proximal, mid and distal segments.  Circumflex Artery: Moderate caliber vessel with moderate caliber first obtuse marginal branch. The proximal Circumflex and the proximal segment of the OM branch have 30% stenosis. Small to moderate caliber intermediate branch.  Right Coronary Artery: Large, dominant vessel with diffuse 20% stenosis proximal  vessel, 40% stenosis mid vessel, 95% hazy stenosis distal vessel. The PDA is small in caliber (2.0 mm) and has a 95% mid stenosis. The posterolateral branch is moderate in caliber and has a 100% total occlusion.  Left Ventricular Angiogram: LVEF 60%  PCI: The RCA was engaged with a JR4 guide. The patient was given a bolus of Angiomax and a drip was started. He was given Effient 60 mg po x 1. A BMW wire was passed down the RCA into the posterolateral branch but I could not get this wire past the total occlusion so a Whisper wire was passed distally. A 2.0 x 8 mm balloon was used to open the totally occluded posterolateral branch. I then carefully positioned and deployed a 2.25 x 12 mm Promus Premier DES in the posterolateral branch. This was post-dilated with a 2.25 x 8 mm [place] balloon. I then turned by attention to the severe stenosis in the distal RCA. I elected to treat the distal RCA lesion today as it was severe, hazy and could limit inflow into the newly placed more distal stent. The distal RCA stenosis was directly stented with a 3.5 x 12 mm Promus Premier DES. This stent was post-dilated with a 3.5 x 8 mm [place] balloon. There was an excellent result.   Assessment and Plan:   1. CAD: Stable post inferior MI March 2014. He will need dual anti-platelet therapy for lifetime based on recent findings in the DAPT trial. Will change Effient to Plavix and continue ASA. Continue beta blocker, ARB. He has not tolerate statins in the past but will try low dose Crestor.   2. HTN: BP well controlled. No changes  3. HLD: Continue Zetia. He has not tolerated statins but most recent LDL is over 140 in primary care. Will start Crestor 5 mg every other day. Check CK, lipids and LFTs in 6 weeks.

## 2014-02-18 NOTE — Patient Instructions (Addendum)
Your physician wants you to follow-up in:  6 months.  You will receive a reminder letter in the mail two months in advance. If you don't receive a letter, please call our office to schedule the follow-up appointment.  Your physician recommends that you return for fasting lab work in:  6 weeks.  This is the week of March 29, 2014. The lab opens at 7:30 AM every week day  Your physician has recommended you make the following change in your medication:   Stop Effient. Start Clopidogrel 75 mg by mouth daily. Start Crestor 5 mg by mouth every other day.

## 2014-02-19 ENCOUNTER — Ambulatory Visit: Payer: PRIVATE HEALTH INSURANCE | Admitting: Cardiovascular Disease

## 2014-02-19 ENCOUNTER — Other Ambulatory Visit: Payer: Self-pay | Admitting: Cardiovascular Disease

## 2014-03-09 ENCOUNTER — Telehealth: Payer: Self-pay | Admitting: Cardiovascular Disease

## 2014-03-09 NOTE — Telephone Encounter (Signed)
Spoke with patient who c/o twitching bilateral eyes, eyes itching, and feeling like something is in both eyes constantly that began approximately 1 week after recent changes in medication.  Patient states he believes symptoms are coming from Plavix which he started 3 weeks ago when Dr. Angelena Form advised him to stop Effient and take Plavix instead.  Patient states he also began Crestor at this time per Dr. Camillia Herter advice and so he stopped the Crestor to see if symptoms improved and symptoms have persisted.  Patient states he has noted some improvement in symptoms since they initially began.  Patient reports he does wear corrective lenses and denies recent changes in his vision Rx.  Patient also states that he does not want to resume Crestor at this time due to an upcoming vacation and he is concerned it is going to make him feel poor.  I advised patient to resume as soon as possible as Dr. Angelena Form feels that he needs this medication and it is different from Lisinopril and he needs to see how well he tolerates the Crestor.  Per patient request I rescheduled his lab appointment for 6 weeks after he will return from vacation as patient says he will resume Crestor upon his return.  I advised patient I am sending message to Dr. Angelena Form and his primary nurse, Enis Slipper, RN for advice when they return to the office.  Patient verbalized understanding and agreement and understands that message will not be returned today.

## 2014-03-09 NOTE — Telephone Encounter (Signed)
Frederick Baker, Can you check on him? If he continues to have issues with Plavix, we could consider stopping and using ASA alone. I would like him to try the Crestor if he can tolerate.  Gerald Stabs

## 2014-03-09 NOTE — Telephone Encounter (Signed)
New message     Pt is on plavix.  He is having trouble with eyes twitching and swelling.  Could it be the medication?

## 2014-03-10 NOTE — Telephone Encounter (Signed)
Spoke with pt who reports eye twitching was severe at first but has now "leveled off."  Occurs off and on everyday. Both eyes. No allergy symptoms. He reports he is able to tolerate it. He will continue Clopidogrel and let us know if further problems.  He is due for vision check up and will contact his eye doctor to schedule appt.  He is going to try start Crestor when he returns from vacation in a couple of weeks.  He is aware of scheduled fasting lab work the week of May 11, 2014.

## 2014-03-11 NOTE — Telephone Encounter (Signed)
Thanks, chris 

## 2014-03-17 ENCOUNTER — Other Ambulatory Visit: Payer: Self-pay | Admitting: Cardiovascular Disease

## 2014-03-25 ENCOUNTER — Other Ambulatory Visit: Payer: PRIVATE HEALTH INSURANCE

## 2014-04-08 ENCOUNTER — Other Ambulatory Visit: Payer: Self-pay

## 2014-04-08 MED ORDER — ZETIA 10 MG PO TABS: ORAL_TABLET | ORAL | Status: DC

## 2014-04-27 ENCOUNTER — Other Ambulatory Visit: Payer: PRIVATE HEALTH INSURANCE

## 2014-05-10 ENCOUNTER — Other Ambulatory Visit: Payer: Self-pay | Admitting: Cardiovascular Disease

## 2014-05-11 ENCOUNTER — Other Ambulatory Visit (INDEPENDENT_AMBULATORY_CARE_PROVIDER_SITE_OTHER): Payer: PRIVATE HEALTH INSURANCE

## 2014-05-11 DIAGNOSIS — E785 Hyperlipidemia, unspecified: Secondary | ICD-10-CM

## 2014-05-11 DIAGNOSIS — I2510 Atherosclerotic heart disease of native coronary artery without angina pectoris: Secondary | ICD-10-CM

## 2014-05-11 LAB — HEPATIC FUNCTION PANEL
ALT: 35 U/L (ref 0–53)
AST: 21 U/L (ref 0–37)
Albumin: 4.2 g/dL (ref 3.5–5.2)
Alkaline Phosphatase: 35 U/L — ABNORMAL LOW (ref 39–117)
Bilirubin, Direct: 0 mg/dL (ref 0.0–0.3)
Total Bilirubin: 0.7 mg/dL (ref 0.2–1.2)
Total Protein: 7.4 g/dL (ref 6.0–8.3)

## 2014-05-11 LAB — LIPID PANEL
Cholesterol: 187 mg/dL (ref 0–200)
HDL: 31 mg/dL — ABNORMAL LOW (ref >39.00)
LDL Cholesterol: 118 mg/dL — ABNORMAL HIGH (ref 0–99)
NonHDL: 156
Total CHOL/HDL Ratio: 6
Triglycerides: 192 mg/dL — ABNORMAL HIGH (ref 0.0–149.0)
VLDL: 38.4 mg/dL (ref 0.0–40.0)

## 2014-05-11 LAB — CK: Total CK: 379 U/L — ABNORMAL HIGH (ref 7–232)

## 2014-05-17 ENCOUNTER — Telehealth: Payer: Self-pay | Admitting: Cardiovascular Disease

## 2014-05-17 NOTE — Telephone Encounter (Signed)
Spoke with pt and reviewed lab results and recommendations from Dr. Angelena Form with him. Copy mailed to him at his request.

## 2014-05-17 NOTE — Telephone Encounter (Signed)
Follow up:    Per pt calling this office for answers to his medication questions and lab results.

## 2014-06-17 ENCOUNTER — Other Ambulatory Visit: Payer: Self-pay

## 2014-06-17 MED ORDER — NITROGLYCERIN 0.4 MG SL SUBL: 0.4000 mg | SUBLINGUAL_TABLET | SUBLINGUAL | Status: DC | PRN

## 2014-08-09 ENCOUNTER — Other Ambulatory Visit: Payer: Self-pay | Admitting: Cardiovascular Disease

## 2014-08-23 ENCOUNTER — Encounter: Payer: Self-pay | Admitting: Cardiovascular Disease

## 2014-08-23 ENCOUNTER — Ambulatory Visit (INDEPENDENT_AMBULATORY_CARE_PROVIDER_SITE_OTHER): Payer: PRIVATE HEALTH INSURANCE | Admitting: Cardiovascular Disease

## 2014-08-23 VITALS — BP 128/92 | HR 56 | Ht 69.0 in | Wt 238.1 lb

## 2014-08-23 DIAGNOSIS — E785 Hyperlipidemia, unspecified: Secondary | ICD-10-CM

## 2014-08-23 DIAGNOSIS — I10 Essential (primary) hypertension: Secondary | ICD-10-CM

## 2014-08-23 DIAGNOSIS — I2510 Atherosclerotic heart disease of native coronary artery without angina pectoris: Secondary | ICD-10-CM

## 2014-08-23 NOTE — Progress Notes (Signed)
History of Present Illness: 53 yo male with history of CAD, HTN, HLD who was admitted to [facility] 12/13/12 with an inferior STEMI. The RCA was occluded distally. I placed a 2.25 x 12 mm Promus Premier DES in the posterolateral branch and a 3.5 x 12 mm Promus Premier DES in the distal RCA. Echo 12/15/12 with LVEF=55% post inferior MI with inferobasal hypokinesis. He has not tolerated statins.   He is here today for follow up. He has been feeling well. No chest pains. No SOB. His wife left him earlier this year. He has gained 20 lbs in last 6 months. He has been eating poorly at night and not exercising.   Primary Care Physician: Tiana Loft [facility])  Last Lipid Profile:Lipid Panel     Component Value Date/Time   CHOL 187 05/11/2014 0818   TRIG 192.0* 05/11/2014 0818   HDL 31.00* 05/11/2014 0818   CHOLHDL 6 05/11/2014 0818   VLDL 38.4 05/11/2014 0818   LDLCALC 118* 05/11/2014 0818   Past Medical History  Diagnosis Date  . HTN (hypertension)   . Hyperlipidemia   . CAD (coronary artery disease)     a. 11/2012 Acute Inf STEMI/Cath/PCI: LM nl, LAD nl, LCX 30p, OM1 30p, RCA 20p, 67m, 95d (3.5x12 promus premier DES), PDA 93m (2.63mm vessel), RPL 100 (2.25x12 promus premier DES), EF 60%;  b. 11/2012 Echo: EF 55%, mild LVH.    Past Surgical History  Procedure Laterality Date  . None      Current Outpatient Prescriptions  Medication Sig Dispense Refill  . aspirin 81 MG tablet Take 81 mg by mouth daily.    . clopidogrel (PLAVIX) 75 MG tablet Take 1 tablet (75 mg total) by mouth daily. 30 tablet 11  . losartan (COZAAR) 25 MG tablet TAKE 1 TABLET BY MOUTH DAILY. 90 tablet 1  . metoprolol tartrate (LOPRESSOR) 25 MG tablet TAKE 1 TABLET BY MOUTH 2 TIMES DAILY. 180 tablet 1  . nitroGLYCERIN (NITROSTAT) 0.4 MG SL tablet Place 1 tablet (0.4 mg total) under the tongue every 5 (five) minutes as needed for chest pain. 25 tablet 3  . ZETIA 10 MG tablet TAKE 1 TABLET BY MOUTH EVERY DAY 90 tablet 1    No current facility-administered medications for this visit.    Allergies  Allergen Reactions  . Lisinopril     cough  . Other     Mushrooms - GI Problems  . Statins     Myalgias     History   Social History  . Marital Status: Single    Spouse Name: N/A    Number of Children: N/A  . Years of Education: N/A   Occupational History  . Works in [place]  . Smoking status: Never Smoker   . Smokeless tobacco: Not on file  . Alcohol Use: Yes  . Drug Use: No  . Sexual Activity: Not on file   Other Topics Concern  . Not on file   Social History Narrative    Family History  Problem Relation Age of Onset  . CAD Paternal Uncle     Review of Systems:  As stated in the HPI and otherwise negative.   BP 128/92 mmHg  Pulse 56  Ht 5\' 9"  (1.753 m)  Wt 238 lb 1.9 oz (108.011 kg)  BMI 35.15 kg/m2  SpO2 97%  Physical Examination: General: Well developed, well nourished, NAD HEENT: OP clear, mucus membranes moist SKIN: warm, dry.  No rashes. Neuro: No focal deficits Musculoskeletal: Muscle strength 5/5 all ext Psychiatric: Mood and affect normal Neck: No JVD, no carotid bruits, no thyromegaly, no lymphadenopathy. Lungs:Clear bilaterally, no wheezes, rhonci, crackles Cardiovascular: Regular rate and rhythm. No murmurs, gallops or rubs. Abdomen:Soft. Bowel sounds present. Non-tender.  Extremities: No lower extremity edema. Pulses are 2 + in the bilateral DP/PT.   Cardiac cath 12/13/12: Left main: No obstructive disease.  Left Anterior Descending Artery: Large caliber vessel that courses to the apex. Mild diffuse plaque in the proximal, mid and distal segments.  Circumflex Artery: Moderate caliber vessel with moderate caliber first obtuse marginal branch. The proximal Circumflex and the proximal segment of the OM branch have 30% stenosis. Small to moderate caliber intermediate branch.  Right Coronary Artery: Large, dominant vessel with  diffuse 20% stenosis proximal vessel, 40% stenosis mid vessel, 95% hazy stenosis distal vessel. The PDA is small in caliber (2.0 mm) and has a 95% mid stenosis. The posterolateral branch is moderate in caliber and has a 100% total occlusion.  Left Ventricular Angiogram: LVEF 60%  PCI: The RCA was engaged with a JR4 guide. The patient was given a bolus of Angiomax and a drip was started. He was given Effient 60 mg po x 1. A BMW wire was passed down the RCA into the posterolateral branch but I could not get this wire past the total occlusion so a Whisper wire was passed distally. A 2.0 x 8 mm balloon was used to open the totally occluded posterolateral branch. I then carefully positioned and deployed a 2.25 x 12 mm Promus Premier DES in the posterolateral branch. This was post-dilated with a 2.25 x 8 mm [place] balloon. I then turned by attention to the severe stenosis in the distal RCA. I elected to treat the distal RCA lesion today as it was severe, hazy and could limit inflow into the newly placed more distal stent. The distal RCA stenosis was directly stented with a 3.5 x 12 mm Promus Premier DES. This stent was post-dilated with a 3.5 x 8 mm [place] balloon. There was an excellent result.   Assessment and Plan:  1. CAD: Doing well. Continue dual anti-platelet therapy with ASA and Plavix. Continue beta blocker, ARB. He has not tolerated statins.   2. HTN: BP well controlled. No changes  3. HLD: Continue Zetia. He has not tolerated statins but most recent LDL is over 140 in primary care. Will check with pharmacy about one of the newer agents.

## 2014-08-23 NOTE — Patient Instructions (Signed)
Your physician wants you to follow-up in:  6 months. You will receive a reminder letter in the mail two months in advance. If you don't receive a letter, please call our office to schedule the follow-up appointment.   

## 2014-09-02 ENCOUNTER — Encounter (HOSPITAL_COMMUNITY): Payer: Self-pay | Admitting: Cardiovascular Disease

## 2014-11-18 ENCOUNTER — Telehealth: Payer: Self-pay | Admitting: Cardiovascular Disease

## 2014-11-18 NOTE — Telephone Encounter (Signed)
Pt calling stating that at last OV with Dr Angelena Form, they had discussed together that the pt will be due for a stress test in the near future.  Pt states he never received a call back in regards to scheduling the stress test.  Pts next appt with Dr Angelena Form is 03/18/15.  Informed the pt that per Dr Camillia Herter last OV note, there was no plan for a stress test mentioned and no new orders placed and documented in the pts  AVS.  Informed the pt that Dr Angelena Form and covering nurse are both out of the office today but I will forward this message for further review and recommendation of stress test requested and someone will follow-up with him thereafter.  Pt verbalized understanding and agrees with this plan.

## 2014-11-18 NOTE — Telephone Encounter (Signed)
New message  Pt called states that he was instructed to have a stress test with next appt. Please put in orders//sr

## 2014-11-22 NOTE — Telephone Encounter (Signed)
I will discuss at his follow up visit with me in June. Can we let him know and make sure he is not having any issues with chest pain or SOB which would make me want to see him back in the office now. Gerald Stabs

## 2014-11-23 NOTE — Telephone Encounter (Signed)
Left message to call back  

## 2014-11-23 NOTE — Telephone Encounter (Signed)
Spoke with pt and gave him information from Dr. Angelena Form.  He is not having any problems at present time and will discuss possible stress test at office visit on March 18, 2015

## 2014-12-16 ENCOUNTER — Other Ambulatory Visit: Payer: Self-pay | Admitting: Cardiovascular Disease

## 2015-02-07 ENCOUNTER — Other Ambulatory Visit: Payer: Self-pay

## 2015-02-07 MED ORDER — METOPROLOL TARTRATE 25 MG PO TABS: 25.0000 mg | ORAL_TABLET | Freq: Two times a day (BID) | ORAL | Status: DC

## 2015-02-07 MED ORDER — CLOPIDOGREL BISULFATE 75 MG PO TABS: 75.0000 mg | ORAL_TABLET | Freq: Every day | ORAL | Status: DC

## 2015-02-07 MED ORDER — LOSARTAN POTASSIUM 25 MG PO TABS: 25.0000 mg | ORAL_TABLET | Freq: Every day | ORAL | Status: DC

## 2015-03-16 ENCOUNTER — Encounter: Payer: Self-pay | Admitting: *Deleted

## 2015-03-18 ENCOUNTER — Ambulatory Visit (INDEPENDENT_AMBULATORY_CARE_PROVIDER_SITE_OTHER): Payer: BLUE CROSS/BLUE SHIELD | Admitting: Cardiovascular Disease

## 2015-03-18 ENCOUNTER — Encounter: Payer: Self-pay | Admitting: Cardiovascular Disease

## 2015-03-18 VITALS — BP 120/82 | HR 64 | Ht 69.0 in | Wt 242.0 lb

## 2015-03-18 DIAGNOSIS — E785 Hyperlipidemia, unspecified: Secondary | ICD-10-CM

## 2015-03-18 DIAGNOSIS — I2510 Atherosclerotic heart disease of native coronary artery without angina pectoris: Secondary | ICD-10-CM | POA: Diagnosis not present

## 2015-03-18 DIAGNOSIS — I10 Essential (primary) hypertension: Secondary | ICD-10-CM

## 2015-03-18 NOTE — Progress Notes (Signed)
Chief Complaint  Patient presents with  . Follow-up    History of Present Illness: 54 yo male with history of CAD, HTN, HLD who was admitted to [facility] 12/13/12 with an inferior STEMI. The RCA was occluded distally. I placed a 2.25 x 12 mm Promus Premier DES in the posterolateral branch and a 3.5 x 12 mm Promus Premier DES in the distal RCA. Echo 12/15/12 with LVEF=55% post inferior MI with inferobasal hypokinesis. He has not tolerated statins in the past despite many attempts in primary care with many different meds and different dosages.   He is here today for follow up. He has been feeling well. No chest pains. No SOB. He has an upcoming colonoscopy. Still some depression after divorce.   Primary Care Physician:  [facility] -Both doctors have moved recently so he has no primary care  Last Lipid Profile:Lipid Panel     Component Value Date/Time   CHOL 187 05/11/2014 0818   TRIG 192.0* 05/11/2014 0818   HDL 31.00* 05/11/2014 0818   CHOLHDL 6 05/11/2014 0818   VLDL 38.4 05/11/2014 0818   LDLCALC 118* 05/11/2014 0818   Past Medical History  Diagnosis Date  . HTN (hypertension)   . Hyperlipidemia   . CAD (coronary artery disease)     a. 11/2012 Acute Inf STEMI/Cath/PCI: LM nl, LAD nl, LCX 30p, OM1 30p, RCA 20p, 47m, 95d (3.5x12 promus premier DES), PDA 48m (2.30mm vessel), RPL 100 (2.25x12 promus premier DES), EF 60%;  b. 11/2012 Echo: EF 55%, mild LVH.    Past Surgical History  Procedure Laterality Date  . None    . Left heart catheterization with coronary angiogram N/A 12/13/2012    Procedure: LEFT HEART CATHETERIZATION WITH CORONARY ANGIOGRAM;  Surgeon: Burnell Blanks, MD;  Location: [facility] CATH LAB;  Service: Cardiovascular;  Laterality: N/A;  . Percutaneous coronary stent intervention (pci-s) N/A 12/13/2012    Procedure: PERCUTANEOUS CORONARY STENT INTERVENTION (PCI-S);  Surgeon: Burnell Blanks, MD;  Location: [facility] CATH LAB;  Service: Cardiovascular;  Laterality: N/A;     Current Outpatient Prescriptions  Medication Sig Dispense Refill  . aspirin 81 MG tablet Take 81 mg by mouth daily.    . clopidogrel (PLAVIX) 75 MG tablet Take 1 tablet (75 mg total) by mouth daily. 30 tablet 5  . losartan (COZAAR) 25 MG tablet Take 1 tablet (25 mg total) by mouth daily. 30 tablet 5  . metoprolol tartrate (LOPRESSOR) 25 MG tablet Take 1 tablet (25 mg total) by mouth 2 (two) times daily. 60 tablet 5  . nitroGLYCERIN (NITROSTAT) 0.4 MG SL tablet Place 1 tablet (0.4 mg total) under the tongue every 5 (five) minutes as needed for chest pain. 25 tablet 3  . ZETIA 10 MG tablet TAKE 1 TABLET BY MOUTH DAILY. 90 tablet 1   No current facility-administered medications for this visit.    Allergies  Allergen Reactions  . Lisinopril     cough  . Other     Mushrooms - GI Problems  . Statins     Myalgias     History   Social History  . Marital Status: Single    Spouse Name: N/A  . Number of Children: N/A  . Years of Education: N/A   Occupational History  . Works in [place]  . Smoking status: Never Smoker   . Smokeless tobacco: Not on file  . Alcohol Use: Yes  . Drug Use: No  . Sexual  Activity: Not on file   Other Topics Concern  . Not on file   Social History Narrative    Family History  Problem Relation Age of Onset  . CAD Paternal Uncle   . Stroke Mother   . Hypertension Father   . Hypertension Mother   . Cancer Brother     Review of Systems:  As stated in the HPI and otherwise negative.   BP 120/82 mmHg  Pulse 64  Ht 5\' 9"  (1.753 m)  Wt 242 lb (109.77 kg)  BMI 35.72 kg/m2  Physical Examination: General: Well developed, well nourished, NAD HEENT: OP clear, mucus membranes moist SKIN: warm, dry. No rashes. Neuro: No focal deficits Musculoskeletal: Muscle strength 5/5 all ext Psychiatric: Mood and affect normal Neck: No JVD, no carotid bruits, no thyromegaly, no lymphadenopathy. Lungs:Clear bilaterally, no  wheezes, rhonci, crackles Cardiovascular: Regular rate and rhythm. No murmurs, gallops or rubs. Abdomen:Soft. Bowel sounds present. Non-tender.  Extremities: No lower extremity edema. Pulses are 2 + in the bilateral DP/PT.   Cardiac cath 12/13/12: Left main: No obstructive disease.  Left Anterior Descending Artery: Large caliber vessel that courses to the apex. Mild diffuse plaque in the proximal, mid and distal segments.  Circumflex Artery: Moderate caliber vessel with moderate caliber first obtuse marginal branch. The proximal Circumflex and the proximal segment of the OM branch have 30% stenosis. Small to moderate caliber intermediate branch.  Right Coronary Artery: Large, dominant vessel with diffuse 20% stenosis proximal vessel, 40% stenosis mid vessel, 95% hazy stenosis distal vessel. The PDA is small in caliber (2.0 mm) and has a 95% mid stenosis. The posterolateral branch is moderate in caliber and has a 100% total occlusion.  Left Ventricular Angiogram: LVEF 60%  PCI: The RCA was engaged with a JR4 guide. The patient was given a bolus of Angiomax and a drip was started. He was given Effient 60 mg po x 1. A BMW wire was passed down the RCA into the posterolateral branch but I could not get this wire past the total occlusion so a Whisper wire was passed distally. A 2.0 x 8 mm balloon was used to open the totally occluded posterolateral branch. I then carefully positioned and deployed a 2.25 x 12 mm Promus Premier DES in the posterolateral branch. This was post-dilated with a 2.25 x 8 mm [place] balloon. I then turned by attention to the severe stenosis in the distal RCA. I elected to treat the distal RCA lesion today as it was severe, hazy and could limit inflow into the newly placed more distal stent. The distal RCA stenosis was directly stented with a 3.5 x 12 mm Promus Premier DES. This stent was post-dilated with a 3.5 x 8 mm [place] balloon. There was an excellent result.   EKG:  EKG is ordered  today. The ekg ordered today demonstrates NSR, rate 64 bpm. Old inferior MI  Recent Labs: 05/11/2014: ALT 35   Lipid Panel    Component Value Date/Time   CHOL 187 05/11/2014 0818   TRIG 192.0* 05/11/2014 0818   HDL 31.00* 05/11/2014 0818   CHOLHDL 6 05/11/2014 0818   VLDL 38.4 05/11/2014 0818   LDLCALC 118* 05/11/2014 0818     Wt Readings from Last 3 Encounters:  03/18/15 242 lb (109.77 kg)  08/23/14 238 lb 1.9 oz (108.011 kg)  02/18/14 220 lb (99.791 kg)     Other studies Reviewed: Additional studies/ records that were reviewed today include: . Review of the above records demonstrates:  Assessment and Plan:   1. CAD: Doing well. Continue dual anti-platelet therapy with ASA and Plavix. Continue beta blocker, ARB. He has not tolerated statins. Will arrange exercise stress test.   2. HTN: BP well controlled. No changes  3. HLD: Continue Zetia. He has not tolerated statins but most recent LDL is 118. He has been tried on multiple different statins over the years at different dosages and has severe intolerance. Will refer to see Ronnald Collum, PharmD to discuss one of the new PCSK9 inhibitors. Repeat lipids.   4. Pre-operative cardiac assessment: ok to hold ASA and Plavix for colonoscopy  Current medicines are reviewed at length with the patient today.  The patient does not have concerns regarding medicines.  The following changes have been made:  no change  Labs/ tests ordered today include:   Orders Placed This Encounter  Procedures  . Lipid Profile  . Exercise Tolerance Test  . EKG 12-Lead    Disposition:   FU with me in 6 months  Signed, Lauree Chandler, MD 03/18/2015 5:18 PM    [facility] [place], [place],   [address] Phone: [phone]; Fax: [phone]

## 2015-03-18 NOTE — Patient Instructions (Signed)
Medication Instructions:  Your physician recommends that you continue on your current medications as directed. Please refer to the Current Medication list given to you today.   Labwork: Your physician recommends that you return for fasting lab work in: next week or so--Lipid profile.  The lab opens at 7:30 every week day    Testing/Procedures: Your physician has requested that you have an exercise tolerance test. For further information please visit [email or web address]. Please also follow instruction sheet, as given.    Follow-Up: You have been referred to Ronnald Collum, PharmD to discuss new cholesterol medications.  Please schedule new patient appointment for patient  Your physician wants you to follow-up in: 6 months.  You will receive a reminder letter in the mail two months in advance. If you don't receive a letter, please call our office to schedule the follow-up appointment.

## 2015-03-21 ENCOUNTER — Other Ambulatory Visit (INDEPENDENT_AMBULATORY_CARE_PROVIDER_SITE_OTHER): Payer: BLUE CROSS/BLUE SHIELD | Admitting: *Deleted

## 2015-03-21 DIAGNOSIS — E785 Hyperlipidemia, unspecified: Secondary | ICD-10-CM | POA: Diagnosis not present

## 2015-03-21 LAB — LIPID PANEL
Cholesterol: 207 mg/dL — ABNORMAL HIGH (ref 0–200)
HDL: 35.9 mg/dL — ABNORMAL LOW (ref >39.00)
LDL Cholesterol: 136 mg/dL — ABNORMAL HIGH (ref 0–99)
NonHDL: 171.1
Total CHOL/HDL Ratio: 6
Triglycerides: 176 mg/dL — ABNORMAL HIGH (ref 0.0–149.0)
VLDL: 35.2 mg/dL (ref 0.0–40.0)

## 2015-03-21 NOTE — Addendum Note (Signed)
Addended by: Briant Cedar on: 03/21/2015 07:36 AM   Modules accepted: Orders

## 2015-03-24 ENCOUNTER — Ambulatory Visit (INDEPENDENT_AMBULATORY_CARE_PROVIDER_SITE_OTHER): Payer: BLUE CROSS/BLUE SHIELD | Admitting: Pharmacist

## 2015-03-24 DIAGNOSIS — E785 Hyperlipidemia, unspecified: Secondary | ICD-10-CM

## 2015-03-24 NOTE — Progress Notes (Signed)
Chief Complaint  Patient presents with  . Hyperlipidemia    History of Present Illness: Frederick Baker is a 54 yo male patient of Dr. Angelena Form who was referred to the Lipid clinic due to a history of statin intolerance.  He has a PMH significant for CAD s/p PCI to his RCA, HTN, and HLD.  He has been intolerant to several statins.  These were mostly tried with his PCP with [facility].  He does not recall all of the dosages but knows that he tried Lipitor and Crestor 5mg  every other day.  When he was on Lipitor, he had severe muscle fatigue and CK elevations into the 900s.  He was referred to rheumatology and the muscle biopsy came back inconclusive.  He was tried on Crestor 5mg  every other day in May 2015 and by August his CK was at 379 and he was complaining of myalgias so the Crestor was stopped.  He is currently only taking Zetia 10mg  daily and tolerating this without difficulty.    Pt admits that his lifestyle habits have been worse over the past year.  He has been going through a divorce and so not eating as well.  He recently had a consultation at [facility] and is considering their program.  He does continue to exercise on the treadmill and his swim spa at least 5 days of the week.    RF: CAD Goals: LDL <70 mg/dL, non-HDL <100 mg/dL Current Therapy: Zetia 10mg  daily  Intolerances: Lipitor- myalgias and increased CK, Crestor 5mg  every other day- elevated CK  Last Lipid Profile:Lipid Panel     Component Value Date/Time   CHOL 207* 03/21/2015 0736   TRIG 176.0* 03/21/2015 0736   HDL 35.90* 03/21/2015 0736   CHOLHDL 6 03/21/2015 0736   VLDL 35.2 03/21/2015 0736   LDLCALC 136* 03/21/2015 0736   Past Medical History  Diagnosis Date  . HTN (hypertension)   . Hyperlipidemia   . CAD (coronary artery disease)     a. 11/2012 Acute Inf STEMI/Cath/PCI: LM nl, LAD nl, LCX 30p, OM1 30p, RCA 20p, 68m, 95d (3.5x12 promus premier DES), PDA 60m (2.34mm vessel),  RPL 100 (2.25x12 promus premier DES), EF 60%;  b. 11/2012 Echo: EF 55%, mild LVH.     Current Outpatient Prescriptions  Medication Sig Dispense Refill  . aspirin 81 MG tablet Take 81 mg by mouth daily.    . clopidogrel (PLAVIX) 75 MG tablet Take 1 tablet (75 mg total) by mouth daily. 30 tablet 5  . losartan (COZAAR) 25 MG tablet Take 1 tablet (25 mg total) by mouth daily. 30 tablet 5  . metoprolol tartrate (LOPRESSOR) 25 MG tablet Take 1 tablet (25 mg total) by mouth 2 (two) times daily. 60 tablet 5  . nitroGLYCERIN (NITROSTAT) 0.4 MG SL tablet Place 1 tablet (0.4 mg total) under the tongue every 5 (five) minutes as needed for chest pain. 25 tablet 3  . ZETIA 10 MG tablet TAKE 1 TABLET BY MOUTH DAILY. 90 tablet 1   No current facility-administered medications for this visit.    Allergies  Allergen Reactions  . Lisinopril     cough  . Other     Mushrooms - GI Problems  . Statins     Myalgias      Family History  Problem Relation Age of Onset  . CAD Paternal Uncle   . Stroke Mother   . Hypertension Father   . Hypertension Mother   .  Cancer Brother      Assessment and Plan:   1. Hyperlipidemia-  Pt's LDL well above goal of <70 mg/dL.  Unfortunately he has had elevated CK with 2 different statins.  Evaluation with rheumatology was inconclusive.  Will not retry another statin given this history.  He is already taking Zetia.  No other available oral medications will achieve LDL goal.  Discussed role of PCSK-9 and ability to lower LDL with patient.  He would like to proceed with benefits investigation.  Will start paperwork and follow up with patient once approved.   Signed, Frederick Baker, PharmD, CPP  03/24/2015 2:31 PM    [address], [place], [place]  [address] Phone: [phone]; Fax: [phone]

## 2015-04-08 ENCOUNTER — Other Ambulatory Visit: Payer: Self-pay

## 2015-04-08 ENCOUNTER — Telehealth: Payer: Self-pay

## 2015-04-08 MED ORDER — ZETIA 10 MG PO TABS: 10.0000 mg | ORAL_TABLET | Freq: Every day | ORAL | Status: DC

## 2015-04-08 NOTE — Telephone Encounter (Signed)
Sallie, Please call this patient asap concerning insurance company denied medication.  [phone]

## 2015-04-12 ENCOUNTER — Telehealth: Payer: Self-pay | Admitting: *Deleted

## 2015-04-12 NOTE — Telephone Encounter (Signed)
Patient asking for a returned call from [place]. Will route message to [facility] for returned call. Zetia was denied through [employer] but was able to activate discount card for Zetia but wants to know what's going on about the new medication talked about in the last office visit. He is worried [employer] will not pay for new medication because they will not pay for Zetia. He wants to touch base with Gay Filler before spending extra money on Zetia.

## 2015-04-13 NOTE — Telephone Encounter (Signed)
Spoke with pt.  His copay for Zetia was $100.  This is the only cholesterol lowering medication he is currently taking so will ask him to continue for now as we are working through the appeals process for PCSK-9 coverage.

## 2015-04-15 ENCOUNTER — Ambulatory Visit (INDEPENDENT_AMBULATORY_CARE_PROVIDER_SITE_OTHER): Payer: BLUE CROSS/BLUE SHIELD

## 2015-04-15 ENCOUNTER — Encounter: Payer: BLUE CROSS/BLUE SHIELD | Admitting: Nurse Practitioner

## 2015-04-15 DIAGNOSIS — I2510 Atherosclerotic heart disease of native coronary artery without angina pectoris: Secondary | ICD-10-CM

## 2015-04-15 LAB — EXERCISE TOLERANCE TEST
Estimated workload: 10 METS
Exercise duration (min): 9 min
Exercise duration (sec): 0 s
MPHR: 165 {beats}/min
Peak BP: 85 mmHg
Peak HR: 146 {beats}/min
Percent HR: 88 %
RPE: 15
Rest HR: 67 {beats}/min

## 2015-05-24 ENCOUNTER — Telehealth: Payer: Self-pay | Admitting: Pharmacist

## 2015-05-24 MED ORDER — ALIROCUMAB 75 MG/ML ~~LOC~~ SOPN: 75.0000 mg | PEN_INJECTOR | SUBCUTANEOUS | Status: DC

## 2015-05-24 NOTE — Telephone Encounter (Signed)
Called [employer].  Was able to get Praluent approved from 05/24/15-11/09/15.  Called pt.  [facility].  He will let us know when he gets the medication so we can teach him how to inject.

## 2015-07-20 ENCOUNTER — Other Ambulatory Visit (INDEPENDENT_AMBULATORY_CARE_PROVIDER_SITE_OTHER): Payer: BLUE CROSS/BLUE SHIELD | Admitting: *Deleted

## 2015-07-20 DIAGNOSIS — I2119 ST elevation (STEMI) myocardial infarction involving other coronary artery of inferior wall: Secondary | ICD-10-CM | POA: Diagnosis not present

## 2015-07-20 DIAGNOSIS — E785 Hyperlipidemia, unspecified: Secondary | ICD-10-CM

## 2015-07-20 DIAGNOSIS — I10 Essential (primary) hypertension: Secondary | ICD-10-CM

## 2015-07-20 LAB — LIPID PANEL
Cholesterol: 112 mg/dL — ABNORMAL LOW (ref 125–200)
HDL: 33 mg/dL — ABNORMAL LOW (ref >=40)
LDL Cholesterol: 57 mg/dL (ref <130)
Total CHOL/HDL Ratio: 3.4 Ratio (ref <=5.0)
Triglycerides: 109 mg/dL (ref <150)
VLDL: 22 mg/dL (ref <30)

## 2015-07-20 LAB — HEPATIC FUNCTION PANEL
ALT: 67 U/L — ABNORMAL HIGH (ref 9–46)
AST: 36 U/L — ABNORMAL HIGH (ref 10–35)
Albumin: 4.2 g/dL (ref 3.6–5.1)
Alkaline Phosphatase: 41 U/L (ref 40–115)
Bilirubin, Direct: 0.1 mg/dL (ref <=0.2)
Indirect Bilirubin: 0.4 mg/dL (ref 0.2–1.2)
Total Bilirubin: 0.5 mg/dL (ref 0.2–1.2)
Total Protein: 7.1 g/dL (ref 6.1–8.1)

## 2015-07-20 NOTE — Addendum Note (Signed)
Addended by: Eulis Foster on: 07/20/2015 08:16 AM   Modules accepted: Orders

## 2015-07-20 NOTE — Addendum Note (Signed)
Addended by: Eulis Foster on: 07/20/2015 08:13 AM   Modules accepted: Orders

## 2015-07-20 NOTE — Addendum Note (Signed)
Addended by: Eulis Foster on: 07/20/2015 08:14 AM   Modules accepted: Orders

## 2015-07-21 ENCOUNTER — Other Ambulatory Visit: Payer: Self-pay | Admitting: *Deleted

## 2015-07-21 DIAGNOSIS — E785 Hyperlipidemia, unspecified: Secondary | ICD-10-CM

## 2015-07-27 ENCOUNTER — Other Ambulatory Visit: Payer: Self-pay | Admitting: Cardiovascular Disease

## 2015-08-23 ENCOUNTER — Telehealth: Payer: Self-pay | Admitting: Pharmacist

## 2015-08-23 NOTE — Telephone Encounter (Signed)
Patient has new health insurance starting 08/25/15 - switching from [facility] to [facility]. Will resubmit paperwork for Praluent coverage. His last shipment under [employer] is shipping today so he will have drug through December. New insurance info is as follows:  Member ID: [id number] GRP: [id number]: [id number] BIN: [id number]

## 2015-08-23 NOTE — Telephone Encounter (Signed)
New Message     Pt calling stating that Jinny Blossom helped him with getting some Cholesterol medications cheaper and pt states that his insurance is changing and he needs to speak to her about this. Please call back and advise.

## 2015-09-05 ENCOUNTER — Other Ambulatory Visit: Payer: Self-pay | Admitting: Cardiovascular Disease

## 2015-09-12 MED ORDER — ALIROCUMAB 75 MG/ML ~~LOC~~ SOPN: 75.0000 mg | PEN_INJECTOR | SUBCUTANEOUS | Status: DC

## 2015-09-12 NOTE — Telephone Encounter (Signed)
Received call from [facility] with [facility].  Praluent has been approved through 03/11/16.  Approval # [id number].  Pt aware.  New Rx sent to [facility].

## 2015-09-12 NOTE — Addendum Note (Signed)
Addended by: Aris Georgia on: 09/12/2015 09:43 AM   Modules accepted: Orders

## 2015-10-11 ENCOUNTER — Other Ambulatory Visit (INDEPENDENT_AMBULATORY_CARE_PROVIDER_SITE_OTHER): Payer: 59 | Admitting: *Deleted

## 2015-10-11 DIAGNOSIS — E785 Hyperlipidemia, unspecified: Secondary | ICD-10-CM

## 2015-10-11 LAB — HEPATIC FUNCTION PANEL
ALT: 46 U/L (ref 9–46)
AST: 29 U/L (ref 10–35)
Albumin: 4.1 g/dL (ref 3.6–5.1)
Alkaline Phosphatase: 37 U/L — ABNORMAL LOW (ref 40–115)
Bilirubin, Direct: 0.1 mg/dL (ref <=0.2)
Indirect Bilirubin: 0.3 mg/dL (ref 0.2–1.2)
Total Bilirubin: 0.4 mg/dL (ref 0.2–1.2)
Total Protein: 6.9 g/dL (ref 6.1–8.1)

## 2015-10-12 ENCOUNTER — Telehealth: Payer: Self-pay | Admitting: Cardiovascular Disease

## 2015-10-12 NOTE — Telephone Encounter (Signed)
Follow up ° ° ° ° ° °Returning a call to the nurse °

## 2015-10-12 NOTE — Telephone Encounter (Signed)
Pt calling stating that he would like to get a discount card for Zetia, because it is costing him $100.00 to get this medication. I informed the pt that we did not have any discount cards for this medication, because it is generic, per Mindy, CMA. I advised the pt that if he has any other problems, questions or concerns to call the office. Pt verbalized understanding.

## 2015-10-12 NOTE — Telephone Encounter (Signed)
Spoke with pt and reviewed results of LFT's with him.

## 2015-12-01 ENCOUNTER — Ambulatory Visit (INDEPENDENT_AMBULATORY_CARE_PROVIDER_SITE_OTHER): Payer: 59 | Admitting: Cardiovascular Disease

## 2015-12-01 ENCOUNTER — Encounter: Payer: Self-pay | Admitting: Cardiovascular Disease

## 2015-12-01 VITALS — BP 130/104 | HR 54 | Ht 69.0 in | Wt 250.0 lb

## 2015-12-01 DIAGNOSIS — I2510 Atherosclerotic heart disease of native coronary artery without angina pectoris: Secondary | ICD-10-CM

## 2015-12-01 DIAGNOSIS — I10 Essential (primary) hypertension: Secondary | ICD-10-CM | POA: Diagnosis not present

## 2015-12-01 MED ORDER — NITROGLYCERIN 0.4 MG SL SUBL: 0.4000 mg | SUBLINGUAL_TABLET | SUBLINGUAL | Status: DC | PRN

## 2015-12-01 NOTE — Patient Instructions (Signed)

## 2015-12-01 NOTE — Progress Notes (Signed)
Chief Complaint  Patient presents with  . Follow-up    History of Present Illness: 55 yo male with history of CAD, HTN, HLD who was admitted to [facility] 12/13/12 with an inferior STEMI. The RCA was occluded distally. I placed a 2.25 x 12 mm Promus Premier DES in the posterolateral branch and a 3.5 x 12 mm Promus Premier DES in the distal RCA. Echo 12/15/12 with LVEF=55% post inferior MI with inferobasal hypokinesis. He has not tolerated statins in the past despite many attempts in primary care with many different meds and different dosages.   He is here today for follow up. He has been feeling well. No chest pains. No SOB. Still some depression after divorce. He is not exercising. He has gained weight. Fatigue all day with no energy.   Primary Care Physician:  [facility] -  Past Medical History  Diagnosis Date  . HTN (hypertension)   . Hyperlipidemia   . CAD (coronary artery disease)     a. 11/2012 Acute Inf STEMI/Cath/PCI: LM nl, LAD nl, LCX 30p, OM1 30p, RCA 20p, 9m, 95d (3.5x12 promus premier DES), PDA 46m (2.70mm vessel), RPL 100 (2.25x12 promus premier DES), EF 60%;  b. 11/2012 Echo: EF 55%, mild LVH.    Past Surgical History  Procedure Laterality Date  . None    . Left heart catheterization with coronary angiogram N/A 12/13/2012    Procedure: LEFT HEART CATHETERIZATION WITH CORONARY ANGIOGRAM;  Surgeon: Burnell Blanks, MD;  Location: [facility] CATH LAB;  Service: Cardiovascular;  Laterality: N/A;  . Percutaneous coronary stent intervention (pci-s) N/A 12/13/2012    Procedure: PERCUTANEOUS CORONARY STENT INTERVENTION (PCI-S);  Surgeon: Burnell Blanks, MD;  Location: [facility] CATH LAB;  Service: Cardiovascular;  Laterality: N/A;    Current Outpatient Prescriptions  Medication Sig Dispense Refill  . Alirocumab (PRALUENT) 75 MG/ML SOPN Inject 75 mg into the skin every 14 (fourteen) days. 2 pen 6  . aspirin 81 MG tablet Take 81 mg by mouth daily.    . clopidogrel (PLAVIX) 75 MG  tablet TAKE 1 TABLET BY MOUTH DAILY. 30 tablet 5  . losartan (COZAAR) 25 MG tablet Take 1 tablet (25 mg total) by mouth daily. 30 tablet 5  . metoprolol tartrate (LOPRESSOR) 25 MG tablet Take 1 tablet (25 mg total) by mouth 2 (two) times daily. 60 tablet 5  . nitroGLYCERIN (NITROSTAT) 0.4 MG SL tablet Place 1 tablet (0.4 mg total) under the tongue every 5 (five) minutes as needed for chest pain. 25 tablet 6  . ZETIA 10 MG tablet TAKE 1 TABLET EVERY DAY 90 tablet 0   No current facility-administered medications for this visit.    Allergies  Allergen Reactions  . Lisinopril     cough  . Other     Mushrooms - GI Problems  . Statins     Myalgias     Social History   Social History  . Marital Status: Single    Spouse Name: N/A  . Number of Children: N/A  . Years of Education: N/A   Occupational History  . Works in [employer]  . Smoking status: Never Smoker   . Smokeless tobacco: Not on file  . Alcohol Use: Yes  . Drug Use: No  . Sexual Activity: Not on file   Other Topics Concern  . Not on file   Social History Narrative    Family History  Problem Relation Age of Onset  . CAD  Paternal Uncle   . Stroke Mother   . Hypertension Father   . Hypertension Mother   . Cancer Brother     Review of Systems:  As stated in the HPI and otherwise negative.   BP 130/104 mmHg  Pulse 54  Ht 5\' 9"  (1.753 m)  Wt 250 lb (113.399 kg)  BMI 36.90 kg/m2  Physical Examination: General: Well developed, well nourished, NAD HEENT: OP clear, mucus membranes moist SKIN: warm, dry. No rashes. Neuro: No focal deficits Musculoskeletal: Muscle strength 5/5 all ext Psychiatric: Mood and affect normal Neck: No JVD, no carotid bruits, no thyromegaly, no lymphadenopathy. Lungs:Clear bilaterally, no wheezes, rhonci, crackles Cardiovascular: Regular rate and rhythm. No murmurs, gallops or rubs. Abdomen:Soft. Bowel sounds present. Non-tender.  Extremities: No  lower extremity edema. Pulses are 2 + in the bilateral DP/PT.   Cardiac cath 12/13/12: Left main: No obstructive disease.  Left Anterior Descending Artery: Large caliber vessel that courses to the apex. Mild diffuse plaque in the proximal, mid and distal segments.  Circumflex Artery: Moderate caliber vessel with moderate caliber first obtuse marginal branch. The proximal Circumflex and the proximal segment of the OM branch have 30% stenosis. Small to moderate caliber intermediate branch.  Right Coronary Artery: Large, dominant vessel with diffuse 20% stenosis proximal vessel, 40% stenosis mid vessel, 95% hazy stenosis distal vessel. The PDA is small in caliber (2.0 mm) and has a 95% mid stenosis. The posterolateral branch is moderate in caliber and has a 100% total occlusion.  Left Ventricular Angiogram: LVEF 60%  PCI: The RCA was engaged with a JR4 guide. The patient was given a bolus of Angiomax and a drip was started. He was given Effient 60 mg po x 1. A BMW wire was passed down the RCA into the posterolateral branch but I could not get this wire past the total occlusion so a Whisper wire was passed distally. A 2.0 x 8 mm balloon was used to open the totally occluded posterolateral branch. I then carefully positioned and deployed a 2.25 x 12 mm Promus Premier DES in the posterolateral branch. This was post-dilated with a 2.25 x 8 mm [place] balloon. I then turned by attention to the severe stenosis in the distal RCA. I elected to treat the distal RCA lesion today as it was severe, hazy and could limit inflow into the newly placed more distal stent. The distal RCA stenosis was directly stented with a 3.5 x 12 mm Promus Premier DES. This stent was post-dilated with a 3.5 x 8 mm [place] balloon. There was an excellent result.   EKG:  EKG is ordered today. The ekg ordered today demonstrates NSR, rate 64 bpm. Old inferior MI  Recent Labs: 10/11/2015: ALT 46   Lipid Panel    Component Value Date/Time   CHOL  112* 07/20/2015 0817   TRIG 109 07/20/2015 0817   HDL 33* 07/20/2015 0817   CHOLHDL 3.4 07/20/2015 0817   VLDL 22 07/20/2015 0817   LDLCALC 57 07/20/2015 0817     Wt Readings from Last 3 Encounters:  12/01/15 250 lb (113.399 kg)  03/18/15 242 lb (109.77 kg)  08/23/14 238 lb 1.9 oz (108.011 kg)     Other studies Reviewed: Additional studies/ records that were reviewed today include: . Review of the above records demonstrates:    Assessment and Plan:   1. CAD: No recent chest pain concerning for angina. Stress test July 2016 without ischemia. Continue dual anti-platelet therapy with ASA and Plavix. Continue beta blocker,  ARB. He has not tolerated statins.   2. HTN: BP well controlled. No changes  3. HLD: He is on Praluent with LDL now below 70.    Current medicines are reviewed at length with the patient today.  The patient does not have concerns regarding medicines.  The following changes have been made:  no change  Labs/ tests ordered today include:   Orders Placed This Encounter  Procedures  . EKG 12-Lead    Disposition:   FU with me in 6 months  Signed, Lauree Chandler, MD 12/01/2015 1:34 PM    [facility] [address], [place], [place]  [address] Phone: [phone]; Fax: [phone]

## 2016-01-25 ENCOUNTER — Other Ambulatory Visit: Payer: Self-pay | Admitting: Cardiovascular Disease

## 2016-03-06 ENCOUNTER — Other Ambulatory Visit: Payer: Self-pay | Admitting: Pharmacist

## 2016-03-06 MED ORDER — ALIROCUMAB 75 MG/ML ~~LOC~~ SOPN: 75.0000 mg | PEN_INJECTOR | SUBCUTANEOUS | Status: DC

## 2016-03-28 ENCOUNTER — Telehealth: Payer: Self-pay | Admitting: Pharmacist

## 2016-03-28 DIAGNOSIS — I2510 Atherosclerotic heart disease of native coronary artery without angina pectoris: Secondary | ICD-10-CM

## 2016-03-28 NOTE — Telephone Encounter (Signed)
Called pt to schedule lipid labwork - needed for [employer]. Pt will come in tomorrow.

## 2016-03-29 ENCOUNTER — Encounter (INDEPENDENT_AMBULATORY_CARE_PROVIDER_SITE_OTHER): Payer: Self-pay

## 2016-03-29 ENCOUNTER — Other Ambulatory Visit (INDEPENDENT_AMBULATORY_CARE_PROVIDER_SITE_OTHER): Payer: 59 | Admitting: *Deleted

## 2016-03-29 DIAGNOSIS — I2510 Atherosclerotic heart disease of native coronary artery without angina pectoris: Secondary | ICD-10-CM

## 2016-03-29 LAB — HEPATIC FUNCTION PANEL
ALT: 37 U/L (ref 9–46)
AST: 25 U/L (ref 10–35)
Albumin: 4.3 g/dL (ref 3.6–5.1)
Alkaline Phosphatase: 40 U/L (ref 40–115)
Bilirubin, Direct: 0.1 mg/dL (ref <=0.2)
Indirect Bilirubin: 0.5 mg/dL (ref 0.2–1.2)
Total Bilirubin: 0.6 mg/dL (ref 0.2–1.2)
Total Protein: 6.7 g/dL (ref 6.1–8.1)

## 2016-03-29 LAB — LIPID PANEL
Cholesterol: 114 mg/dL — ABNORMAL LOW (ref 125–200)
HDL: 40 mg/dL (ref >=40)
LDL Cholesterol: 47 mg/dL (ref <130)
Total CHOL/HDL Ratio: 2.9 Ratio (ref <=5.0)
Triglycerides: 137 mg/dL (ref <150)
VLDL: 27 mg/dL (ref <30)

## 2016-04-09 ENCOUNTER — Encounter: Payer: Self-pay | Admitting: Pharmacist

## 2016-04-09 NOTE — Telephone Encounter (Signed)
This encounter was created in error - please disregard.

## 2016-04-17 ENCOUNTER — Other Ambulatory Visit: Payer: Self-pay | Admitting: Pharmacist

## 2016-04-17 MED ORDER — ALIROCUMAB 75 MG/ML ~~LOC~~ SOPN: 75.0000 mg | PEN_INJECTOR | SUBCUTANEOUS | 11 refills | Status: DC

## 2016-04-20 ENCOUNTER — Other Ambulatory Visit: Payer: Self-pay | Admitting: Cardiovascular Disease

## 2016-10-27 ENCOUNTER — Ambulatory Visit (INDEPENDENT_AMBULATORY_CARE_PROVIDER_SITE_OTHER): Payer: 59 | Admitting: Family Medicine

## 2016-10-27 VITALS — BP 138/82 | HR 63 | Temp 98.7°F | Resp 20 | Ht 69.5 in | Wt 250.5 lb

## 2016-10-27 DIAGNOSIS — I10 Essential (primary) hypertension: Secondary | ICD-10-CM | POA: Diagnosis not present

## 2016-10-27 DIAGNOSIS — F418 Other specified anxiety disorders: Secondary | ICD-10-CM

## 2016-10-27 DIAGNOSIS — Z0000 Encounter for general adult medical examination without abnormal findings: Secondary | ICD-10-CM

## 2016-10-27 DIAGNOSIS — E785 Hyperlipidemia, unspecified: Secondary | ICD-10-CM

## 2016-10-27 DIAGNOSIS — Z1159 Encounter for screening for other viral diseases: Secondary | ICD-10-CM | POA: Diagnosis not present

## 2016-10-27 DIAGNOSIS — I2510 Atherosclerotic heart disease of native coronary artery without angina pectoris: Secondary | ICD-10-CM | POA: Diagnosis not present

## 2016-10-27 DIAGNOSIS — Z23 Encounter for immunization: Secondary | ICD-10-CM

## 2016-10-27 DIAGNOSIS — Z114 Encounter for screening for human immunodeficiency virus [HIV]: Secondary | ICD-10-CM

## 2016-10-27 DIAGNOSIS — Z125 Encounter for screening for malignant neoplasm of prostate: Secondary | ICD-10-CM | POA: Diagnosis not present

## 2016-10-27 NOTE — Progress Notes (Signed)
Subjective:  By signing my name below, I, Raven Small, attest that this documentation has been prepared under the direction and in the presence of Merri Ray, MD.  Electronically Signed: Thea Alken, ED Scribe. 10/27/2016. 10:39 AM.   Patient ID: Frederick Baker, male    DOB: 30-Jul-1961, 56 y.o.   MRN: [id number]  HPI Chief Complaint  Patient presents with  . Annual Exam    New Patient    HPI Comments: Frederick Baker is a 56 y.o. male who presents to the [facility] for a complete physical. He is a new pt. Followed by cardiology, Dr. Angelena Form, for hx of CAD, HTN, HLD.   CAD He had an inferior stemi 11/2012, treated wit stents, asymptomatic at cardio follow 11/2015. Stress 03/2015 without ischemia. Discontinued on dual platelet therapy with aspirin in Plavix. continue on beta blocker arb did not tolerate statins.  Pt reports appointment with cardiologist in a few months. He check his BP outside of office, with readings similar to reading today.   HTN He is on Lopressor and losartan.   Lab Results  Component Value Date   CREATININE 1.2 12/24/2012    HLD Intolerant of statins, he receives praluent every 2 weeks Lab Results  Component Value Date   CHOL 114 (L) 03/29/2016   HDL 40 03/29/2016   LDLCALC 47 03/29/2016   TRIG 137 03/29/2016   CHOLHDL 2.9 03/29/2016   Lab Results  Component Value Date   ALT 37 03/29/2016   AST 25 03/29/2016   ALKPHOS 40 03/29/2016   BILITOT 0.6 03/29/2016   Pt is fasting.   Cancer Screening Colonoscopy- 03/2016, repeat in 10 years Prostate-  Pt reports prostate testing about 5-6 years ago. He agrees to testing today. Pt denies family hx of prostate cancer.  No results found for: PSA  Depression screening  Depression screen [facility] 2/9 10/27/2016 01/05/2013  Decreased Interest 0 0  Down, Depressed, Hopeless 0 0  PHQ - 2 Score 0 0   He notes feeling uncomfortable in closed spaces, specifically with flying. He notes being  divorced a few years ago. He denies feeling depressed.   Immunizations  Immunization History  Administered Date(s) Administered  . Influenza-Unspecified 08/27/2016   TDAP- thinks its been about 10 years but is unsure, will update today.  HIV screening- agrees to HIV testing today Hep C screening- agrees to hep C screening today  Vision  Visual Acuity Screening   Right eye Left eye Both eyes  Without correction:     With correction: 20/30 20/25 20/25    He has seen optho within the past year.  Dentist Pt is followed by a dentist.  Exercise  Pt reports having a physical job, but does not exercise outside of work.   Hx of [place] stones His urologist is Dr. Serita Butcher. Also notes hx of vasectomy.    Patient Active Problem List   Diagnosis Date Noted  . CAD (coronary artery disease) 12/16/2012  . Hyperlipidemia 12/15/2012  . ST elevation myocardial infarction (STEMI) of inferior wall ([place]) 12/13/2012  . HTN (hypertension) 12/13/2012   Past Medical History:  Diagnosis Date  . CAD (coronary artery disease)    a. 11/2012 Acute Inf STEMI/Cath/PCI: LM nl, LAD nl, LCX 30p, OM1 30p, RCA 20p, 33m, 95d (3.5x12 promus premier DES), PDA 10m (2.53mm vessel), RPL 100 (2.25x12 promus premier DES), EF 60%;  b. 11/2012 Echo: EF 55%, mild LVH.  Marland Kitchen HTN (hypertension)   . Hyperlipidemia   .  Kidney stone    Past Surgical History:  Procedure Laterality Date  . LEFT HEART CATHETERIZATION WITH CORONARY ANGIOGRAM N/A 12/13/2012   Procedure: LEFT HEART CATHETERIZATION WITH CORONARY ANGIOGRAM;  Surgeon: Burnell Blanks, MD;  Location: [facility] CATH LAB;  Service: Cardiovascular;  Laterality: N/A;  . None    . PERCUTANEOUS CORONARY STENT INTERVENTION (PCI-S) N/A 12/13/2012   Procedure: PERCUTANEOUS CORONARY STENT INTERVENTION (PCI-S);  Surgeon: Burnell Blanks, MD;  Location: [facility] CATH LAB;  Service: Cardiovascular;  Laterality: N/A;   Allergies  Allergen Reactions  . Lisinopril     cough  .  Other     Mushrooms - GI Problems  . Statins     Myalgias    Prior to Admission medications   Medication Sig Start Date End Date Taking? Authorizing Provider  Alirocumab (PRALUENT) 75 MG/ML SOPN Inject 75 mg into the skin every 14 (fourteen) days. 04/17/16  Yes Burnell Blanks, MD  aspirin 81 MG tablet Take 81 mg by mouth daily.   Yes Historical Provider, MD  clopidogrel (PLAVIX) 75 MG tablet TAKE 1 TABLET BY MOUTH DAILY. 01/26/16  Yes Burnell Blanks, MD  ezetimibe (ZETIA) 10 MG tablet TAKE 1 TABLET EVERY DAY MUST MAKE APPT 04/20/16  Yes Burnell Blanks, MD  losartan (COZAAR) 25 MG tablet TAKE 1 TABLET (25 MG TOTAL) BY MOUTH DAILY. 01/26/16  Yes Burnell Blanks, MD  metoprolol tartrate (LOPRESSOR) 25 MG tablet TAKE 1 TABLET (25 MG TOTAL) BY MOUTH 2 (TWO) TIMES DAILY. 01/26/16  Yes Burnell Blanks, MD  nitroGLYCERIN (NITROSTAT) 0.4 MG SL tablet Place 1 tablet (0.4 mg total) under the tongue every 5 (five) minutes as needed for chest pain. 12/01/15  Yes Burnell Blanks, MD   Social History   Social History  . Marital status: Single    Spouse name: N/A  . Number of children: N/A  . Years of education: N/A   Occupational History  . Works in [place] History Main Topics  . Smoking status: Never Smoker  . Smokeless tobacco: Never Used  . Alcohol use Yes  . Drug use: No  . Sexual activity: Not on file   Other Topics Concern  . Not on file   Social History Narrative  . No narrative on file   Review of Systems 13 point ROS negative expect positive itchy eyes and back pain. Denies any acute concerns but plan to f/u to discussed positive finding in ROS as well as situational anxiety.    Objective:   Physical Exam  Constitutional: He is oriented to person, place, and time. He appears well-developed and well-nourished.  HENT:  Head: Normocephalic and atraumatic.  Right Ear: External ear normal.  Left Ear: External ear normal.   Mouth/Throat: Oropharynx is clear and moist.  Eyes: Conjunctivae and EOM are normal. Pupils are equal, round, and reactive to light.  Neck: Normal range of motion. Neck supple. No thyromegaly present.  Cardiovascular: Normal rate, regular rhythm, normal heart sounds and intact distal pulses.   Pulmonary/Chest: Effort normal and breath sounds normal. No respiratory distress. He has no wheezes.  Abdominal: Soft. He exhibits no distension. There is no tenderness. Hernia confirmed negative in the right inguinal area and confirmed negative in the left inguinal area.  Genitourinary: Prostate normal.  Musculoskeletal: Normal range of motion. He exhibits no edema or tenderness.  Lymphadenopathy:    He has no cervical adenopathy.  Neurological: He is alert and oriented to person,  place, and time. He has normal reflexes.  Skin: Skin is warm and dry.  Psychiatric: He has a normal mood and affect. His behavior is normal.  Vitals reviewed.     Vitals:   10/27/16 1005  BP: 138/82  Pulse: 63  Resp: 20  Temp: 98.7 F (37.1 C)  TempSrc: Oral  SpO2: 96%  Weight: 250 lb 8 oz (113.6 kg)  Height: 5' 9.5" (1.765 m)   Assessment & Plan:  Frederick Baker is a 56 y.o. male Annual physical exam  - -anticipatory guidance as below in AVS, screening labs above. Health maintenance items as above in HPI discussed/recommended as applicable.   Essential hypertension - Plan: Comprehensive metabolic panel  - Check labs, no change in medications at this time. Continue Cozaar and Lopressor same doses.  Hyperlipidemia, unspecified hyperlipidemia type - Plan: Lipid panel  - Check lipid panel, continue praluent, aspirin  Situational anxiety  - Possible claustrophobia versus other situational anxiety. Overall infrequent, do not think daily medication needed at this time, but offered temporary benzodiazepine for situational use. Can return to discuss this medication and use further or if more frequent  symptoms.  Screening for prostate cancer - Plan: PSA  -We discussed pros and cons of prostate cancer screening, and after this discussion, he chose to have screening done. PSA obtained, and no concerning findings on DRE.   Need for hepatitis C screening test - Plan: Hepatitis C antibody  Encounter for screening for HIV - Plan: HIV antibody  Coronary artery disease involving native heart without angina pectoris, unspecified vessel or lesion type  - Asymptomatic. Continue routine follow-up with cardiology.  Need for Tdap vaccination - Plan: Tdap vaccine greater than or equal to 7yo IM   No orders of the defined types were placed in this encounter.  Patient Instructions    Please return to discuss claustrophobia or anxiety symptoms further, as a medication prior to flying may be helpful.  Keeping you healthy  Get these tests  Blood pressure- Have your blood pressure checked once a year by your healthcare provider.  Normal blood pressure is 120/80  Weight- Have your body mass index (BMI) calculated to screen for obesity.  BMI is a measure of body fat based on height and weight. You can also calculate your own BMI at [email or web address].  Cholesterol- Have your cholesterol checked every year.  Diabetes- Have your blood sugar checked regularly if you have high blood pressure, high cholesterol, have a family history of diabetes or if you are overweight.  Screening for Colon Cancer- Colonoscopy starting at age 82.  Screening may begin sooner depending on your family history and other health conditions. Follow up colonoscopy as directed by your Gastroenterologist.  Screening for Prostate Cancer- Both blood work (PSA) and a rectal exam help screen for Prostate Cancer.  Screening begins at age 32 with African-American men and at age 2 with Caucasian men.  Screening may begin sooner depending on your family history.  Take these medicines  Aspirin- One aspirin daily can help prevent  Heart disease and Stroke.  Flu shot- Every fall.  Tetanus- Every 10 years.  Zostavax- Once after the age of 13 to prevent Shingles.  Pneumonia shot- Once after the age of 83; if you are younger than 2, ask your healthcare provider if you need a Pneumonia shot.  Take these steps  Don't smoke- If you do smoke, talk to your doctor about quitting.  For tips on how to quit, go to www.smokefree.gov or  call 1-800-QUIT-NOW.  Be physically active- Exercise 5 days a week for at least 30 minutes.  If you are not already physically active start slow and gradually work up to 30 minutes of moderate physical activity.  Examples of moderate activity include walking briskly, mowing the yard, dancing, swimming, bicycling, etc.  Eat a healthy diet- Eat a variety of healthy food such as fruits, vegetables, low fat milk, low fat cheese, yogurt, lean meant, poultry, fish, beans, tofu, etc. For more information go to www.thenutritionsource.org  Drink alcohol in moderation- Limit alcohol intake to less than two drinks a day. Never drink and drive.  Dentist- Brush and floss twice daily; visit your dentist twice a year.  Depression- Your emotional health is as important as your physical health. If you're feeling down, or losing interest in things you would normally enjoy please talk to your healthcare provider.  Eye exam- Visit your eye doctor every year.  Safe sex- If you may be exposed to a sexually transmitted infection, use a condom.  Seat belts- Seat belts can save your life; always wear one.  Smoke/Carbon Monoxide detectors- These detectors need to be installed on the appropriate level of your home.  Replace batteries at least once a year.  Skin cancer- When out in the sun, cover up and use sunscreen 15 SPF or higher.  Violence- If anyone is threatening you, please tell your healthcare provider.  Living Will/ Health care power of attorney- Speak with your healthcare provider and family.    IF  you received an x-ray today, you will receive an invoice from [facility]. Please contact [facility] at [phone] with questions or concerns regarding your invoice.   IF you received labwork today, you will receive an invoice from [place]. Please contact [facility] at [phone] with questions or concerns regarding your invoice.   Our billing staff will not be able to assist you with questions regarding bills from these companies.  You will be contacted with the lab results as soon as they are available. The fastest way to get your results is to activate your My Chart account. Instructions are located on the last page of this paperwork. If you have not heard from [place] regarding the results in 2 weeks, please contact this office.      I personally performed the services described in this documentation, which was scribed in my presence. The recorded information has been reviewed and considered for accuracy and completeness, addended by me as needed, and agree with information above.  Signed,   Merri Ray, MD [facility].  10/28/16 4:19 PM

## 2016-10-27 NOTE — Patient Instructions (Addendum)
Please return to discuss claustrophobia or anxiety symptoms further, as a medication prior to flying may be helpful.  Keeping you healthy  Get these tests  Blood pressure- Have your blood pressure checked once a year by your healthcare provider.  Normal blood pressure is 120/80  Weight- Have your body mass index (BMI) calculated to screen for obesity.  BMI is a measure of body fat based on height and weight. You can also calculate your own BMI at [email or web address].  Cholesterol- Have your cholesterol checked every year.  Diabetes- Have your blood sugar checked regularly if you have high blood pressure, high cholesterol, have a family history of diabetes or if you are overweight.  Screening for Colon Cancer- Colonoscopy starting at age 56.  Screening may begin sooner depending on your family history and other health conditions. Follow up colonoscopy as directed by your Gastroenterologist.  Screening for Prostate Cancer- Both blood work (PSA) and a rectal exam help screen for Prostate Cancer.  Screening begins at age 2 with African-American men and at age 76 with Caucasian men.  Screening may begin sooner depending on your family history.  Take these medicines  Aspirin- One aspirin daily can help prevent Heart disease and Stroke.  Flu shot- Every fall.  Tetanus- Every 10 years.  Zostavax- Once after the age of 45 to prevent Shingles.  Pneumonia shot- Once after the age of 3; if you are younger than 3, ask your healthcare provider if you need a Pneumonia shot.  Take these steps  Don't smoke- If you do smoke, talk to your doctor about quitting.  For tips on how to quit, go to www.smokefree.gov or call 1-800-QUIT-NOW.  Be physically active- Exercise 5 days a week for at least 30 minutes.  If you are not already physically active start slow and gradually work up to 30 minutes of moderate physical activity.  Examples of moderate activity include walking briskly, mowing the  yard, dancing, swimming, bicycling, etc.  Eat a healthy diet- Eat a variety of healthy food such as fruits, vegetables, low fat milk, low fat cheese, yogurt, lean meant, poultry, fish, beans, tofu, etc. For more information go to www.thenutritionsource.org  Drink alcohol in moderation- Limit alcohol intake to less than two drinks a day. Never drink and drive.  Dentist- Brush and floss twice daily; visit your dentist twice a year.  Depression- Your emotional health is as important as your physical health. If you're feeling down, or losing interest in things you would normally enjoy please talk to your healthcare provider.  Eye exam- Visit your eye doctor every year.  Safe sex- If you may be exposed to a sexually transmitted infection, use a condom.  Seat belts- Seat belts can save your life; always wear one.  Smoke/Carbon Monoxide detectors- These detectors need to be installed on the appropriate level of your home.  Replace batteries at least once a year.  Skin cancer- When out in the sun, cover up and use sunscreen 15 SPF or higher.  Violence- If anyone is threatening you, please tell your healthcare provider.  Living Will/ Health care power of attorney- Speak with your healthcare provider and family.    IF you received an x-ray today, you will receive an invoice from [facility] Rad[facility]. Please contact [facility] at [phone] with questions or concerns regarding your invoice.   IF you received labwork today, you will receive an invoice from [place]. Please contact [facility] at [phone] with questions or concerns regarding your invoice.   Our billing  staff will not be able to assist you with questions regarding bills from these companies.  You will be contacted with the lab results as soon as they are available. The fastest way to get your results is to activate your My Chart account. Instructions are located on the last page of this paperwork. If you have not heard  from [place] regarding the results in 2 weeks, please contact this office.

## 2016-10-28 LAB — COMPREHENSIVE METABOLIC PANEL
ALT: 61 IU/L — ABNORMAL HIGH (ref 0–44)
AST: 33 IU/L (ref 0–40)
Albumin/Globulin Ratio: 1.8 (ref 1.2–2.2)
Albumin: 4.8 g/dL (ref 3.5–5.5)
Alkaline Phosphatase: 49 IU/L (ref 39–117)
BUN/Creatinine Ratio: 18 (ref 9–20)
BUN: 19 mg/dL (ref 6–24)
Bilirubin Total: 0.5 mg/dL (ref 0.0–1.2)
CO2: 24 mmol/L (ref 18–29)
Calcium: 9.5 mg/dL (ref 8.7–10.2)
Chloride: 99 mmol/L (ref 96–106)
Creatinine, Ser: 1.03 mg/dL (ref 0.76–1.27)
GFR calc Af Amer: 93 mL/min/{1.73_m2} (ref >59)
GFR calc non Af Amer: 81 mL/min/{1.73_m2} (ref >59)
Globulin, Total: 2.6 g/dL (ref 1.5–4.5)
Glucose: 93 mg/dL (ref 65–99)
Potassium: 5.1 mmol/L (ref 3.5–5.2)
Sodium: 145 mmol/L — ABNORMAL HIGH (ref 134–144)
Total Protein: 7.4 g/dL (ref 6.0–8.5)

## 2016-10-28 LAB — HEPATITIS C ANTIBODY: Hep C Virus Ab: <0.1 s/co ratio (ref 0.0–0.9)

## 2016-10-28 LAB — LIPID PANEL
Chol/HDL Ratio: 3.3 ratio units (ref 0.0–5.0)
Cholesterol, Total: 134 mg/dL (ref 100–199)
HDL: 41 mg/dL (ref >39)
LDL Calculated: 55 mg/dL (ref 0–99)
Triglycerides: 191 mg/dL — ABNORMAL HIGH (ref 0–149)
VLDL Cholesterol Cal: 38 mg/dL (ref 5–40)

## 2016-10-28 LAB — PSA: Prostate Specific Ag, Serum: 0.7 ng/mL (ref 0.0–4.0)

## 2016-10-28 LAB — HIV ANTIBODY (ROUTINE TESTING W REFLEX): HIV Screen 4th Generation wRfx: NONREACTIVE

## 2016-12-17 ENCOUNTER — Other Ambulatory Visit: Payer: Self-pay | Admitting: Cardiovascular Disease

## 2016-12-21 ENCOUNTER — Ambulatory Visit (INDEPENDENT_AMBULATORY_CARE_PROVIDER_SITE_OTHER): Payer: 59 | Admitting: Cardiovascular Disease

## 2016-12-21 ENCOUNTER — Encounter: Payer: Self-pay | Admitting: Cardiovascular Disease

## 2016-12-21 VITALS — BP 120/70 | HR 57 | Ht 69.5 in | Wt 243.4 lb

## 2016-12-21 DIAGNOSIS — E7800 Pure hypercholesterolemia, unspecified: Secondary | ICD-10-CM

## 2016-12-21 DIAGNOSIS — I2510 Atherosclerotic heart disease of native coronary artery without angina pectoris: Secondary | ICD-10-CM | POA: Diagnosis not present

## 2016-12-21 DIAGNOSIS — I10 Essential (primary) hypertension: Secondary | ICD-10-CM | POA: Diagnosis not present

## 2016-12-21 MED ORDER — METOPROLOL TARTRATE 25 MG PO TABS: ORAL_TABLET | ORAL | 3 refills | Status: DC

## 2016-12-21 MED ORDER — LOSARTAN POTASSIUM 25 MG PO TABS: ORAL_TABLET | ORAL | 3 refills | Status: DC

## 2016-12-21 MED ORDER — CLOPIDOGREL BISULFATE 75 MG PO TABS: 75.0000 mg | ORAL_TABLET | Freq: Every day | ORAL | 3 refills | Status: DC

## 2016-12-21 MED ORDER — EZETIMIBE 10 MG PO TABS: 10.0000 mg | ORAL_TABLET | Freq: Every day | ORAL | 3 refills | Status: DC

## 2016-12-21 NOTE — Progress Notes (Signed)
Chief Complaint  Patient presents with  . Follow-up    12 month follow up    History of Present Illness: 56 yo male with history of CAD, HTN, HLD who is here today for followup. He was admitted to [facility] 12/13/12 with an inferior STEMI. The RCA was occluded distally. I placed a 2.25 x 12 mm Promus Premier DES in the posterolateral branch and a 3.5 x 12 mm Promus Premier DES in the distal RCA. Echo 12/15/12 with LVEF=55% post inferior MI with inferobasal hypokinesis. He has not tolerated statins in the past despite many attempts in primary care with many different meds and different dosages.   He is here today for follow up. He has been feeling well. No chest pains. No SOB. He is not exercising regularly.    Primary Care Physician: No PCP Per Patient  Merri Ray   Past Medical History:  Diagnosis Date  . CAD (coronary artery disease)    a. 11/2012 Acute Inf STEMI/Cath/PCI: LM nl, LAD nl, LCX 30p, OM1 30p, RCA 20p, 11m, 95d (3.5x12 promus premier DES), PDA 32m (2.33mm vessel), RPL 100 (2.25x12 promus premier DES), EF 60%;  b. 11/2012 Echo: EF 55%, mild LVH.  Marland Kitchen HTN (hypertension)   . Hyperlipidemia   . Kidney stone     Past Surgical History:  Procedure Laterality Date  . LEFT HEART CATHETERIZATION WITH CORONARY ANGIOGRAM N/A 12/13/2012   Procedure: LEFT HEART CATHETERIZATION WITH CORONARY ANGIOGRAM;  Surgeon: Burnell Blanks, MD;  Location: [facility] CATH LAB;  Service: Cardiovascular;  Laterality: N/A;  . None    . PERCUTANEOUS CORONARY STENT INTERVENTION (PCI-S) N/A 12/13/2012   Procedure: PERCUTANEOUS CORONARY STENT INTERVENTION (PCI-S);  Surgeon: Burnell Blanks, MD;  Location: [facility] CATH LAB;  Service: Cardiovascular;  Laterality: N/A;    Current Outpatient Prescriptions  Medication Sig Dispense Refill  . Alirocumab (PRALUENT) 75 MG/ML SOPN Inject 75 mg into the skin every 14 (fourteen) days. 2 pen 11  . aspirin 81 MG tablet Take 81 mg by mouth daily.    . clopidogrel  (PLAVIX) 75 MG tablet Take 1 tablet (75 mg total) by mouth daily. 90 tablet 3  . ezetimibe (ZETIA) 10 MG tablet Take 1 tablet (10 mg total) by mouth daily. 90 tablet 3  . losartan (COZAAR) 25 MG tablet TAKE 1 TABLET (25 MG TOTAL) BY MOUTH DAILY. 90 tablet 3  . metoprolol tartrate (LOPRESSOR) 25 MG tablet TAKE 1 TABLET (25 MG TOTAL) BY MOUTH 2 (TWO) TIMES DAILY. 180 tablet 3  . nitroGLYCERIN (NITROSTAT) 0.4 MG SL tablet Place 1 tablet (0.4 mg total) under the tongue every 5 (five) minutes as needed for chest pain. 25 tablet 6   No current facility-administered medications for this visit.     Allergies  Allergen Reactions  . Lisinopril     cough  . Other     Mushrooms - GI Problems  . Statins     Myalgias     Social History   Social History  . Marital status: Single    Spouse name: N/A  . Number of children: N/A  . Years of education: N/A   Occupational History  . Works in [place] [place] History Main Topics  . Smoking status: Never Smoker  . Smokeless tobacco: Never Used  . Alcohol use Yes  . Drug use: No  . Sexual activity: Not on file   Other Topics Concern  . Not on file   Social History  Narrative  . No narrative on file    Family History  Problem Relation Age of Onset  . Stroke Mother   . Hypertension Mother   . Hypertension Father   . CAD Paternal Uncle   . Cancer Brother     Review of Systems:  As stated in the HPI and otherwise negative.   BP 120/70   Pulse (!) 57   Ht 5' 9.5" (1.765 m)   Wt 243 lb 6.4 oz (110.4 kg)   SpO2 96%   BMI 35.43 kg/m   Physical Examination: General: Well developed, well nourished, NAD  HEENT: OP clear, mucus membranes moist  SKIN: warm, dry. No rashes. Neuro: No focal deficits  Musculoskeletal: Muscle strength 5/5 all ext  Psychiatric: Mood and affect normal  Neck: No JVD, no carotid bruits, no thyromegaly, no lymphadenopathy.  Lungs:Clear bilaterally, no wheezes, rhonci, crackles Cardiovascular:  Regular rate and rhythm. No murmurs, gallops or rubs. Abdomen:Soft. Bowel sounds present. Non-tender.  Extremities: No lower extremity edema. Pulses are 2 + in the bilateral DP/PT.   Cardiac cath 12/13/12: Left main: No obstructive disease.  Left Anterior Descending Artery: Large caliber vessel that courses to the apex. Mild diffuse plaque in the proximal, mid and distal segments.  Circumflex Artery: Moderate caliber vessel with moderate caliber first obtuse marginal branch. The proximal Circumflex and the proximal segment of the OM branch have 30% stenosis. Small to moderate caliber intermediate branch.  Right Coronary Artery: Large, dominant vessel with diffuse 20% stenosis proximal vessel, 40% stenosis mid vessel, 95% hazy stenosis distal vessel. The PDA is small in caliber (2.0 mm) and has a 95% mid stenosis. The posterolateral branch is moderate in caliber and has a 100% total occlusion.  Left Ventricular Angiogram: LVEF 60%  PCI: The RCA was engaged with a JR4 guide. The patient was given a bolus of Angiomax and a drip was started. He was given Effient 60 mg po x 1. A BMW wire was passed down the RCA into the posterolateral branch but I could not get this wire past the total occlusion so a Whisper wire was passed distally. A 2.0 x 8 mm balloon was used to open the totally occluded posterolateral branch. I then carefully positioned and deployed a 2.25 x 12 mm Promus Premier DES in the posterolateral branch. This was post-dilated with a 2.25 x 8 mm [place] balloon. I then turned by attention to the severe stenosis in the distal RCA. I elected to treat the distal RCA lesion today as it was severe, hazy and could limit inflow into the newly placed more distal stent. The distal RCA stenosis was directly stented with a 3.5 x 12 mm Promus Premier DES. This stent was post-dilated with a 3.5 x 8 mm  balloon. There was an excellent result.   EKG:  EKG is not ordered today. The ekg ordered today demonstrates    Recent Labs: 10/27/2016: ALT 61; BUN 19; Creatinine, Ser 1.03; Potassium 5.1; Sodium 145   Lipid Panel    Component Value Date/Time   CHOL 134 10/27/2016 1112   TRIG 191 (H) 10/27/2016 1112   HDL 41 10/27/2016 1112   CHOLHDL 3.3 10/27/2016 1112   CHOLHDL 2.9 03/29/2016 0752   VLDL 27 03/29/2016 0752   LDLCALC 55 10/27/2016 1112     Wt Readings from Last 3 Encounters:  12/21/16 243 lb 6.4 oz (110.4 kg)  10/27/16 250 lb 8 oz (113.6 kg)  12/01/15 250 lb (113.4 kg)     Other  studies Reviewed: Additional studies/ records that were reviewed today include: . Review of the above records demonstrates:    Assessment and Plan:   1. CAD without angina: No recent chest pain suggestive of angina. Stress test July 2016 without ischemia. Continue ASA, Plavix, beta blocker and ARB. He has not tolerated statins.   2. HTN: BP well controlled. No changes  3. HLD: He is on Praluent with LDL now below 70.    Current medicines are reviewed at length with the patient today.  The patient does not have concerns regarding medicines.  The following changes have been made:  no change  Labs/ tests ordered today include:   No orders of the defined types were placed in this encounter.   Disposition:   FU with me in 12  months  Signed, Lauree Chandler, MD 12/21/2016 4:29 PM    [address], [place], [place]  [address] Phone: [phone]; Fax: [phone]

## 2016-12-21 NOTE — Patient Instructions (Signed)

## 2017-01-14 ENCOUNTER — Other Ambulatory Visit: Payer: Self-pay | Admitting: Cardiovascular Disease

## 2017-02-28 ENCOUNTER — Other Ambulatory Visit: Payer: Self-pay | Admitting: Cardiovascular Disease

## 2017-03-01 ENCOUNTER — Telehealth: Payer: Self-pay | Admitting: Cardiovascular Disease

## 2017-03-01 NOTE — Telephone Encounter (Signed)
Okay to take for short perio of time ONLY. 5-7 days  Ibuprofen chronic daily use may increase risk of GI bleed in combination with plavix.

## 2017-03-01 NOTE — Telephone Encounter (Signed)
Pt was given an RX for [facility] which includes Ibuprofen 800mg  and 26 mg of Famotieine in it for his foot, he wants to make sure he can take it-pls advise

## 2017-03-01 NOTE — Telephone Encounter (Signed)
Pt notified-stressed "short perio of time ONLY" pt was wondering if there was something else he could take. Any ideas? He will also ask foot doctor

## 2017-03-01 NOTE — Telephone Encounter (Signed)
Patient will be calling his "foot doctor" to discuss other alternatives .

## 2017-05-16 ENCOUNTER — Telehealth: Payer: Self-pay | Admitting: Pharmacist

## 2017-05-16 NOTE — Telephone Encounter (Signed)
PA reauth approved through 05/16/18.

## 2017-05-16 NOTE — Telephone Encounter (Signed)
Pt to come for samples if PA not processed by Tuesday - when next dose due.

## 2017-05-16 NOTE — Telephone Encounter (Signed)
Pt called to report that pharmacy needs another PA for Praluent.   PA reauth sent 05/16/17 via CMM.

## 2017-05-22 NOTE — Telephone Encounter (Signed)
LM to make pt aware

## 2017-05-29 ENCOUNTER — Ambulatory Visit (INDEPENDENT_AMBULATORY_CARE_PROVIDER_SITE_OTHER): Payer: 59 | Admitting: Cardiovascular Disease

## 2017-05-29 ENCOUNTER — Encounter: Payer: Self-pay | Admitting: Cardiovascular Disease

## 2017-05-29 ENCOUNTER — Telehealth: Payer: Self-pay | Admitting: Cardiovascular Disease

## 2017-05-29 VITALS — BP 128/80 | HR 64 | Ht 69.0 in | Wt 242.4 lb

## 2017-05-29 DIAGNOSIS — I2510 Atherosclerotic heart disease of native coronary artery without angina pectoris: Secondary | ICD-10-CM | POA: Diagnosis not present

## 2017-05-29 DIAGNOSIS — I10 Essential (primary) hypertension: Secondary | ICD-10-CM | POA: Diagnosis not present

## 2017-05-29 DIAGNOSIS — E7800 Pure hypercholesterolemia, unspecified: Secondary | ICD-10-CM | POA: Diagnosis not present

## 2017-05-29 NOTE — Telephone Encounter (Signed)
I spoke with pt. He does not sound short of breath on the phone.  He states for the last 5 days he has noticed shortness of breath.  Yawning a lot. Has to take a deep breath.Feels like he is not getting enough oxygen.  No chest pain but does have general aches and pains.  This has not changed recently.  Shortness of breath is not worse with activity.  Notices more when he is sitting down.  It is constant. Worse when he is out in the heat. Under stress but this has not increased lately. He reports he just does not feel good and is concerned there is something wrong.  I scheduled him to see Dr. Angelena Form today 4:20

## 2017-05-29 NOTE — Progress Notes (Signed)
Chief Complaint  Patient presents with  . Shortness of Breath    History of Present Illness: 56 yo male with history of CAD, HTN, HLD who is here today for followup. He was admitted to [facility] 12/13/12 with an inferior STEMI. The RCA was occluded distally. I placed a drug eluting stent in the posterolateral branch and a drug eluting stent in the distal RCA. Echo 12/15/12 with LVEF=55% post inferior MI with inferobasal hypokinesis. He has not tolerated statins in the past despite many attempts in primary care with many different meds and different dosages. He is on Praluent injections. Nuclear stress test in 2016 showed no ischemia.   He is here today for follow up. The patient denies any chest pain, palpitations, orthopnea, PND, dizziness, near syncope or syncope. He has had five days of occasional dyspnea. He has been yawning frequently. No worsened with exertion. No exertional chest pain. No weight gain, abdominal distention or LE edema.   Primary Care Physician: Patient, No Pcp Per  Merri Ray   Past Medical History:  Diagnosis Date  . CAD (coronary artery disease)    a. 11/2012 Acute Inf STEMI/Cath/PCI: LM nl, LAD nl, LCX 30p, OM1 30p, RCA 20p, 47m, 95d (3.5x12 promus premier DES), PDA 76m (2.67mm vessel), RPL 100 (2.25x12 promus premier DES), EF 60%;  b. 11/2012 Echo: EF 55%, mild LVH.  Marland Kitchen HTN (hypertension)   . Hyperlipidemia   . Kidney stone     Past Surgical History:  Procedure Laterality Date  . LEFT HEART CATHETERIZATION WITH CORONARY ANGIOGRAM N/A 12/13/2012   Procedure: LEFT HEART CATHETERIZATION WITH CORONARY ANGIOGRAM;  Surgeon: Burnell Blanks, MD;  Location: [facility] CATH LAB;  Service: Cardiovascular;  Laterality: N/A;  . None    . PERCUTANEOUS CORONARY STENT INTERVENTION (PCI-S) N/A 12/13/2012   Procedure: PERCUTANEOUS CORONARY STENT INTERVENTION (PCI-S);  Surgeon: Burnell Blanks, MD;  Location: [name] CATH LAB;  Service: Cardiovascular;  Laterality: N/A;     Current Outpatient Prescriptions  Medication Sig Dispense Refill  . aspirin 81 MG tablet Take 81 mg by mouth daily.    . clopidogrel (PLAVIX) 75 MG tablet Take 1 tablet (75 mg total) by mouth daily. 90 tablet 3  . ezetimibe (ZETIA) 10 MG tablet Take 1 tablet (10 mg total) by mouth daily. 90 tablet 3  . losartan (COZAAR) 25 MG tablet TAKE 1 TABLET (25 MG TOTAL) BY MOUTH DAILY. 90 tablet 3  . metoprolol tartrate (LOPRESSOR) 25 MG tablet TAKE 1 TABLET (25 MG TOTAL) BY MOUTH 2 (TWO) TIMES DAILY. 180 tablet 3  . nitroGLYCERIN (NITROSTAT) 0.4 MG SL tablet Place 1 tablet (0.4 mg total) under the tongue every 5 (five) minutes as needed for chest pain. 25 tablet 6  . PRALUENT 75 MG/ML SOPN INJEINJ 75MG  SUBCUTANEOUSLY EVERY 14 DAYS 2 pen 11   No current facility-administered medications for this visit.     Allergies  Allergen Reactions  . Lisinopril     cough  . Other     Mushrooms - GI Problems  . Statins     Myalgias     Social History   Social History  . Marital status: Single    Spouse name: N/A  . Number of children: N/A  . Years of education: N/A   Occupational History  . Works in [employer] History Main Topics  . Smoking status: Never Smoker  . Smokeless tobacco: Never Used  . Alcohol use Yes  . Drug use:  No  . Sexual activity: Not on file   Other Topics Concern  . Not on file   Social History Narrative  . No narrative on file    Family History  Problem Relation Age of Onset  . Stroke Mother   . Hypertension Mother   . Hypertension Father   . CAD Paternal Uncle   . Cancer Brother     Review of Systems:  As stated in the HPI and otherwise negative.   BP 128/80   Pulse 64   Ht 5\' 9"  (1.753 m)   Wt 242 lb 6.4 oz (110 kg)   SpO2 98%   BMI 35.80 kg/m   Physical Examination:  General: Well developed, well nourished, NAD  HEENT: OP clear, mucus membranes moist  SKIN: warm, dry. No rashes. Neuro: No focal deficits   Musculoskeletal: Muscle strength 5/5 all ext  Psychiatric: Mood and affect normal  Neck: No JVD, no carotid bruits, no thyromegaly, no lymphadenopathy.  Lungs:Clear bilaterally, no wheezes, rhonci, crackles Cardiovascular: Regular rate and rhythm. No murmurs, gallops or rubs. Abdomen:Soft. Bowel sounds present. Non-tender.  Extremities: No lower extremity edema. Pulses are 2 + in the bilateral DP/PT.  Cardiac cath 12/13/12: Left main: No obstructive disease.  Left Anterior Descending Artery: Large caliber vessel that courses to the apex. Mild diffuse plaque in the proximal, mid and distal segments.  Circumflex Artery: Moderate caliber vessel with moderate caliber first obtuse marginal branch. The proximal Circumflex and the proximal segment of the OM branch have 30% stenosis. Small to moderate caliber intermediate branch.  Right Coronary Artery: Large, dominant vessel with diffuse 20% stenosis proximal vessel, 40% stenosis mid vessel, 95% hazy stenosis distal vessel. The PDA is small in caliber (2.0 mm) and has a 95% mid stenosis. The posterolateral branch is moderate in caliber and has a 100% total occlusion.  Left Ventricular Angiogram: LVEF 60%  EKG:  EKG is  ordered today. The ekg ordered today demonstrates sinus bradycardia, rate 59 bpm. Old inferior Q waves. NO changes from last EKG  Recent Labs: 10/27/2016: ALT 61; BUN 19; Creatinine, Ser 1.03; Potassium 5.1; Sodium 145   Lipid Panel    Component Value Date/Time   CHOL 134 10/27/2016 1112   TRIG 191 (H) 10/27/2016 1112   HDL 41 10/27/2016 1112   CHOLHDL 3.3 10/27/2016 1112   CHOLHDL 2.9 03/29/2016 0752   VLDL 27 03/29/2016 0752   LDLCALC 55 10/27/2016 1112     Wt Readings from Last 3 Encounters:  05/29/17 242 lb 6.4 oz (110 kg)  12/21/16 243 lb 6.4 oz (110.4 kg)  10/27/16 250 lb 8 oz (113.6 kg)     Other studies Reviewed: Additional studies/ records that were reviewed today include: . Review of the above records  demonstrates:    Assessment and Plan:   1. CAD without angina: He has no chest pain suggestive of angina and no exertional dyspnea. EKG does not show ischemic changes. I do not think his dyspnea is ischemic driven. I will arrange an echo to assess his LVEF since this has not been done since 2014. LV function normal by nuclear stress test in 2016. Continue ASA, Plavix, beta blocker and ARB. He has not tolerated statins but is doing well on Zetia and Praluent.   2. HTN: BP well controlled. No changes.   3. HLD: He is on Praluent. LDL at goal of 70.   Current medicines are reviewed at length with the patient today.  The patient does not have  concerns regarding medicines.  The following changes have been made:  no change  Labs/ tests ordered today include:   Orders Placed This Encounter  Procedures  . EKG 12-Lead  . ECHOCARDIOGRAM COMPLETE    Disposition:   FU with me in 6  months  Signed, Lauree Chandler, MD 05/29/2017 5:01 PM    [facility] [place], [place], [place]  [address] Phone: [phone]; Fax: [phone]

## 2017-05-29 NOTE — Patient Instructions (Signed)

## 2017-05-29 NOTE — Telephone Encounter (Signed)
New message    Pt c/o Shortness Of Breath: STAT if SOB developed within the last 24 hours or pt is noticeably SOB on the phone  1. Are you currently SOB (can you hear that pt is SOB on the phone)? Yes, cant not hear on the phone.   2. How long have you been experiencing SOB? 5 days  3. Are you SOB when sitting or when up moving around? Sitting down  4. Are you currently experiencing any other symptoms? Pt states he feels funny.  Pt scheduled an appt with Melina Copa tomorrow at 1130am.

## 2017-05-30 ENCOUNTER — Ambulatory Visit: Payer: 59 | Admitting: Physician Assistant

## 2017-06-04 ENCOUNTER — Other Ambulatory Visit (HOSPITAL_COMMUNITY): Payer: 59

## 2017-06-06 ENCOUNTER — Ambulatory Visit (HOSPITAL_COMMUNITY): Payer: 59 | Attending: Cardiovascular Disease

## 2017-06-06 ENCOUNTER — Other Ambulatory Visit: Payer: Self-pay

## 2017-06-06 DIAGNOSIS — I2510 Atherosclerotic heart disease of native coronary artery without angina pectoris: Secondary | ICD-10-CM

## 2017-06-06 DIAGNOSIS — I420 Dilated cardiomyopathy: Secondary | ICD-10-CM | POA: Diagnosis not present

## 2017-06-18 ENCOUNTER — Ambulatory Visit (INDEPENDENT_AMBULATORY_CARE_PROVIDER_SITE_OTHER): Payer: 59 | Admitting: Pulmonary Disease

## 2017-06-18 ENCOUNTER — Encounter: Payer: Self-pay | Admitting: Pulmonary Disease

## 2017-06-18 VITALS — BP 122/70 | HR 67 | Ht 69.0 in | Wt 236.8 lb

## 2017-06-18 DIAGNOSIS — J45909 Unspecified asthma, uncomplicated: Secondary | ICD-10-CM

## 2017-06-18 NOTE — Progress Notes (Signed)
Past surgical history He  has a past surgical history that includes None; left heart catheterization with coronary angiogram (N/A, 12/13/2012); and percutaneous coronary stent intervention (pci-s) (N/A, 12/13/2012).  Family history His family history includes CAD in his paternal uncle; Cancer in his brother; Hypertension in his father and mother; Stroke in his mother.  Social history He  reports that he has never smoked. He has never used smokeless tobacco. He reports that he drinks alcohol. He reports that he does not use drugs.  Allergies  Allergen Reactions  . Lisinopril     cough  . Other     Mushrooms - GI Problems  . Statins     Myalgias     Current Outpatient Prescriptions on File Prior to Visit  Medication Sig  . aspirin 81 MG tablet Take 81 mg by mouth daily.  . clopidogrel (PLAVIX) 75 MG tablet Take 1 tablet (75 mg total) by mouth daily.  Marland Kitchen ezetimibe (ZETIA) 10 MG tablet Take 1 tablet (10 mg total) by mouth daily.  Marland Kitchen losartan (COZAAR) 25 MG tablet TAKE 1 TABLET (25 MG TOTAL) BY MOUTH DAILY.  . metoprolol tartrate (LOPRESSOR) 25 MG tablet TAKE 1 TABLET (25 MG TOTAL) BY MOUTH 2 (TWO) TIMES DAILY.  . nitroGLYCERIN (NITROSTAT) 0.4 MG SL tablet Place 1 tablet (0.4 mg total) under the tongue every 5 (five) minutes as needed for chest pain.  Marland Kitchen PRALUENT 75 MG/ML SOPN INJEINJ 75MG  SUBCUTANEOUSLY EVERY 14 DAYS   No current facility-administered medications on file prior to visit.     Chief Complaint  Patient presents with  . Sleep Consult    Pt referred by Dr. Laural Benes. Pt had siffness, tightness in chest hard time breathing for last month. Pt states sleepiness during the day, sleeps around 6 hours a night. Pt has some SOB with extersion.     Cardiac tests Echo 06/06/17 >> EF 55 to 60%, mild LA dilation  Past medical history He  has a past medical history of CAD (coronary artery disease); HTN (hypertension); Hyperlipidemia; and Kidney stone.  Vital signs BP 122/70 (BP  Location: Left Arm, Cuff Size: Normal)   Pulse 67   Ht 5\' 9"  (1.753 m)   Wt 236 lb 12.8 oz (107.4 kg)   SpO2 95%   BMI 34.97 kg/m   History of present illness Frederick Baker is a 56 y.o. male never smoker with dyspnea.  He had his symptoms start in August.  He was seen by cardiology and assessment was unremarkable.  He went to urgent care.  He had chest xray that was normal.  He was prescribed qvar and albuterol, and referred to pulmonary.  He used qvar for two days, and started feeling better.  He never needed to use albuterol.  He is not having any symptoms for the past few days.    He snores, but doesn't feel like he has any other issues at night.  He used to get allergies with change of season.   He works in [employer], and spends half his time on the floor.  No issues at work.  He has a Programmer, systems.  From [facility].  No travel history or sick exposures.  He denies fever, sputum, hemoptysis, sinus congestion, post nasal drip, skin rash, or leg swelling.   Physical exam  General - No distress ENT - No sinus tenderness, no oral exudate, no LAN, no thyromegaly, TM clear, pupils equal/reactive Cardiac - s1s2 regular, no murmur, pulses symmetric Chest - No  wheeze/rales/dullness, good air entry, normal respiratory excursion Back - No focal tenderness Abd - Soft, non-tender, no organomegaly, + bowel sounds Ext - No edema Neuro - Normal strength, cranial nerves intact Skin - No rashes Psych - Normal mood, and behavior   CMP Latest Ref Rng & Units 10/27/2016 03/29/2016 10/11/2015  Glucose 65 - 99 mg/dL 93 - -  BUN 6 - 24 mg/dL 19 - -  Creatinine 0.76 - 1.27 mg/dL 1.03 - -  Sodium 134 - 144 mmol/L 145(H) - -  Potassium 3.5 - 5.2 mmol/L 5.1 - -  Chloride 96 - 106 mmol/L 99 - -  CO2 18 - 29 mmol/L 24 - -  Calcium 8.7 - 10.2 mg/dL 9.5 - -  Total Protein 6.0 - 8.5 g/dL 7.4 6.7 6.9  Total Bilirubin 0.0 - 1.2 mg/dL 0.5 0.6 0.4  Alkaline Phos 39 - 117 IU/L 49 40 37(L)  AST 0 - 40  IU/L 33 25 29  ALT 0 - 44 IU/L 61(H) 37 46     CBC Latest Ref Rng & Units 12/16/2012 12/14/2012 12/13/2012  WBC 4.0 - 10.5 K/uL 9.0 10.6(H) 14.7(H)  Hemoglobin 13.0 - 17.0 g/dL 14.9 14.7 15.8  Hematocrit 39.0 - 52.0 % 41.3 40.3 42.9  Platelets 150 - 400 K/uL 202 202 223    CXR report from 06/09/17 - no acute findings   Discussion He likely had episode of asthmatic bronchitis from environmental exposures.  Improved with course of ICS therapy.  He also reports snoring and has history of CAD.  Assessment/plan  Asthmatic bronchitis. - will arrange for PFTs - don't think he needs maintenance inhalers at this time  Snoring. - advised him to monitor his sleep pattern, and then determine whether he needs further sleep assessment   Patient Instructions  Will arrange for pulmonary function test  Follow up in 6 months    Chesley Mires, MD [facility] Pager:  [phone] 06/18/2017, 3:51 PM

## 2017-06-18 NOTE — Progress Notes (Signed)
   Subjective:    Patient ID: Frederick Baker, male    DOB: 10-09-1960, 56 y.o.   MRN: [id number]  HPI    Review of Systems  Constitutional: Negative for fever and unexpected weight change.  HENT: Negative for congestion, dental problem, ear pain, nosebleeds, postnasal drip, rhinorrhea, sinus pressure, sneezing, sore throat and trouble swallowing.   Eyes: Negative for redness and itching.  Respiratory: Positive for chest tightness and shortness of breath. Negative for cough and wheezing.   Cardiovascular: Negative for palpitations and leg swelling.  Gastrointestinal: Negative for nausea and vomiting.  Genitourinary: Negative for dysuria.  Musculoskeletal: Negative for joint swelling.  Skin: Negative for rash.  Allergic/Immunologic: Negative.  Negative for environmental allergies, food allergies and immunocompromised state.  Neurological: Negative for headaches.  Hematological: Does not bruise/bleed easily.  Psychiatric/Behavioral: Negative for dysphoric mood. The patient is not nervous/anxious.        Objective:   Physical Exam        Assessment & Plan:

## 2017-06-18 NOTE — Patient Instructions (Signed)
Will arrange for pulmonary function test  Follow up in 6 months

## 2017-06-21 ENCOUNTER — Ambulatory Visit (INDEPENDENT_AMBULATORY_CARE_PROVIDER_SITE_OTHER): Payer: 59 | Admitting: Pulmonary Disease

## 2017-06-21 DIAGNOSIS — J45909 Unspecified asthma, uncomplicated: Secondary | ICD-10-CM | POA: Diagnosis not present

## 2017-06-21 LAB — PULMONARY FUNCTION TEST
DL/VA % pred: 114 %
DL/VA: 5.32 ml/min/mmHg/L
DLCO cor % pred: 109 %
DLCO cor: 35.27 ml/min/mmHg
DLCO unc % pred: 108 %
DLCO unc: 35.07 ml/min/mmHg
FEF 25-75 Post: 4.71 L/sec
FEF 25-75 Pre: 4.27 L/sec
FEF2575-%Change-Post: 10 %
FEF2575-%Pred-Post: 152 %
FEF2575-%Pred-Pre: 137 %
FEV1-%Change-Post: 1 %
FEV1-%Pred-Post: 109 %
FEV1-%Pred-Pre: 107 %
FEV1-Post: 4.04 L
FEV1-Pre: 3.98 L
FEV1FVC-%Change-Post: 3 %
FEV1FVC-%Pred-Pre: 111 %
FEV6-%Change-Post: -2 %
FEV6-%Pred-Post: 99 %
FEV6-%Pred-Pre: 101 %
FEV6-Post: 4.59 L
FEV6-Pre: 4.69 L
FEV6FVC-%Change-Post: 0 %
FEV6FVC-%Pred-Post: 104 %
FEV6FVC-%Pred-Pre: 104 %
FVC-%Change-Post: -2 %
FVC-%Pred-Post: 94 %
FVC-%Pred-Pre: 96 %
FVC-Post: 4.59 L
FVC-Pre: 4.69 L
Post FEV1/FVC ratio: 88 %
Post FEV6/FVC ratio: 100 %
Pre FEV1/FVC ratio: 85 %
Pre FEV6/FVC Ratio: 100 %
RV % pred: 90 %
RV: 1.98 L
TLC % pred: 94 %
TLC: 6.61 L

## 2017-06-21 NOTE — Progress Notes (Signed)
PFT completed today. 06/21/17

## 2017-07-02 ENCOUNTER — Telehealth: Payer: Self-pay | Admitting: Pulmonary Disease

## 2017-07-02 NOTE — Telephone Encounter (Signed)
Left voice mail on machine for patient to return phone call back regarding results of PFT.  Will follow up again with patient at later date.

## 2017-07-02 NOTE — Telephone Encounter (Signed)
Please let pt know PFT was normal.

## 2017-07-02 NOTE — Telephone Encounter (Signed)
Called and spoke with patient today regarding results per VS.  Informed the patient of his results today and recommendations per VS. The patient verbalized understanding and denied any questions or concerns at this time. Nothing further needed.

## 2017-08-29 ENCOUNTER — Telehealth: Payer: Self-pay | Admitting: Pharmacist

## 2017-08-29 NOTE — Telephone Encounter (Signed)
Pt called clinic stating he is switching insurances to [employer]. He will need a new prior authorization for Praluent. Pt will fax in insurance card. He has not had labs checked since February - updated labs will need to be drawn in order to obtain approval. He will come in in 1 week for labs. New rx will need to be sent to [facility].

## 2017-09-05 ENCOUNTER — Other Ambulatory Visit: Payer: BLUE CROSS/BLUE SHIELD | Admitting: *Deleted

## 2017-09-05 ENCOUNTER — Telehealth: Payer: Self-pay | Admitting: Pharmacist

## 2017-09-05 ENCOUNTER — Other Ambulatory Visit: Payer: Self-pay | Admitting: *Deleted

## 2017-09-05 DIAGNOSIS — E785 Hyperlipidemia, unspecified: Secondary | ICD-10-CM

## 2017-09-05 LAB — LIPID PANEL
Chol/HDL Ratio: 3 ratio (ref 0.0–5.0)
Cholesterol, Total: 124 mg/dL (ref 100–199)
HDL: 41 mg/dL (ref >39)
LDL Calculated: 41 mg/dL (ref 0–99)
Triglycerides: 212 mg/dL — ABNORMAL HIGH (ref 0–149)
VLDL Cholesterol Cal: 42 mg/dL — ABNORMAL HIGH (ref 5–40)

## 2017-09-05 NOTE — Telephone Encounter (Signed)
Pt presents today for lipid panel for Praluent reapproval on his new [employer] plan. His Zetia currently costs him > $100 for a 3 month supply. Will submit reauthorization for higher dose of Praluent 150mg /mL and plan to stop Zetia due to cost. Pt will need new rx sent to [facility] and would like his other maintenance prescriptions sent there for a 3 month supply as well.

## 2017-09-05 NOTE — Addendum Note (Signed)
Addended by: Sharese Manrique E on: 09/05/2017 04:43 PM   Modules accepted: Orders

## 2017-09-10 MED ORDER — LOSARTAN POTASSIUM 25 MG PO TABS: ORAL_TABLET | ORAL | 3 refills | Status: DC

## 2017-09-10 MED ORDER — METOPROLOL TARTRATE 25 MG PO TABS: ORAL_TABLET | ORAL | 3 refills | Status: DC

## 2017-09-10 MED ORDER — CLOPIDOGREL BISULFATE 75 MG PO TABS: 75.0000 mg | ORAL_TABLET | Freq: Every day | ORAL | 3 refills | Status: DC

## 2017-09-10 MED ORDER — ALIROCUMAB 150 MG/ML ~~LOC~~ SOPN: 1.0000 "pen " | PEN_INJECTOR | SUBCUTANEOUS | 11 refills | Status: DC

## 2017-09-10 NOTE — Addendum Note (Signed)
Addended by: Jamaia Brum E on: 09/10/2017 01:48 PM   Modules accepted: Orders

## 2017-09-10 NOTE — Telephone Encounter (Signed)
PA approved for higher dose of Praluent 150mg /mL. Rx sent to specialty pharmacy with other refills as requested below.

## 2017-11-12 MED ORDER — CLOPIDOGREL BISULFATE 75 MG PO TABS: 75.0000 mg | ORAL_TABLET | Freq: Every day | ORAL | 3 refills | Status: DC

## 2017-11-12 MED ORDER — LOSARTAN POTASSIUM 25 MG PO TABS: ORAL_TABLET | ORAL | 3 refills | Status: DC

## 2017-11-12 NOTE — Addendum Note (Signed)
Addended by: Derl Barrow on: 11/12/2017 09:01 AM   Modules accepted: Orders

## 2017-11-12 NOTE — Telephone Encounter (Signed)
Pt's medication losartan and clopidogrel was sent to wrong mail order pharmacy. Resent medications to correct pharmacy as requested. Confirmation received.

## 2017-11-26 DIAGNOSIS — J069 Acute upper respiratory infection, unspecified: Secondary | ICD-10-CM | POA: Diagnosis not present

## 2017-12-03 ENCOUNTER — Ambulatory Visit (INDEPENDENT_AMBULATORY_CARE_PROVIDER_SITE_OTHER): Payer: BLUE CROSS/BLUE SHIELD

## 2017-12-03 ENCOUNTER — Other Ambulatory Visit: Payer: Self-pay

## 2017-12-03 ENCOUNTER — Ambulatory Visit: Payer: BLUE CROSS/BLUE SHIELD | Admitting: Physician Assistant

## 2017-12-03 VITALS — BP 131/70 | HR 60 | Temp 98.0°F | Resp 17 | Ht 69.0 in | Wt 250.0 lb

## 2017-12-03 DIAGNOSIS — R05 Cough: Secondary | ICD-10-CM | POA: Diagnosis not present

## 2017-12-03 DIAGNOSIS — R059 Cough, unspecified: Secondary | ICD-10-CM

## 2017-12-03 DIAGNOSIS — J209 Acute bronchitis, unspecified: Secondary | ICD-10-CM

## 2017-12-03 LAB — POCT CBC
Granulocyte percent: 61.6 %G (ref 37–80)
HCT, POC: 43.7 % (ref 43.5–53.7)
Hemoglobin: 15 g/dL (ref 14.1–18.1)
Lymph, poc: 2.9 (ref 0.6–3.4)
MCH, POC: 33.2 pg — AB (ref 27–31.2)
MCHC: 34.5 g/dL (ref 31.8–35.4)
MCV: 96.3 fL (ref 80–97)
MID (cbc): 0.8 (ref 0–0.9)
MPV: 7.3 fL (ref 0–99.8)
POC Granulocyte: 5.9 (ref 2–6.9)
POC LYMPH PERCENT: 30.3 %L (ref 10–50)
POC MID %: 8.1 %M (ref 0–12)
Platelet Count, POC: 331 10*3/uL (ref 142–424)
RBC: 4.54 M/uL — AB (ref 4.69–6.13)
RDW, POC: 12.2 %
WBC: 9.6 10*3/uL (ref 4.6–10.2)

## 2017-12-03 MED ORDER — BENZONATATE 100 MG PO CAPS: 100.0000 mg | ORAL_CAPSULE | Freq: Three times a day (TID) | ORAL | 0 refills | Status: DC | PRN

## 2017-12-03 NOTE — Patient Instructions (Addendum)
    Acute Bronchitis, Adult Acute bronchitis is when air tubes (bronchi) in the lungs suddenly get swollen. The condition can make it hard to breathe. It can also cause these symptoms:  A cough.  Coughing up clear, yellow, or green mucus.  Wheezing.  Chest congestion.  Shortness of breath.  A fever.  Body aches.  Chills.  A sore throat.  Follow these instructions at home: Medicines  Take over-the-counter and prescription medicines only as told by your doctor.  If you were prescribed an antibiotic medicine, take it as told by your doctor. Do not stop taking the antibiotic even if you start to feel better. General instructions  Rest.  Drink enough fluids to keep your pee (urine) clear or pale yellow.  Avoid smoking and secondhand smoke. If you smoke and you need help quitting, ask your doctor. Quitting will help your lungs heal faster.  Use an inhaler, cool mist vaporizer, or humidifier as told by your doctor.  Keep all follow-up visits as told by your doctor. This is important. How is this prevented? To lower your risk of getting this condition again:  Wash your hands often with soap and water. If you cannot use soap and water, use hand sanitizer.  Avoid contact with people who have cold symptoms.  Try not to touch your hands to your mouth, nose, or eyes.  Make sure to get the flu shot every year.  Contact a doctor if:  Your symptoms do not get better in 2 weeks. Get help right away if:  You cough up blood.  You have chest pain.  You have very bad shortness of breath.  You become dehydrated.  You faint (pass out) or keep feeling like you are going to pass out.  You keep throwing up (vomiting).  You have a very bad headache.  Your fever or chills gets worse. This information is not intended to replace advice given to you by your health care provider. Make sure you discuss any questions you have with your health care provider. Document Released:  02/27/2008 Document Revised: 04/18/2016 Document Reviewed: 02/29/2016 Elsevier Interactive Patient Education  2018 [employer].   IF you received an x-ray today, you will receive an invoice from [facility]. Please contact [facility] at [phone] with questions or concerns regarding your invoice.   IF you received labwork today, you will receive an invoice from [facility]. Please contact [facility] at [phone] with questions or concerns regarding your invoice.   Our billing staff will not be able to assist you with questions regarding bills from these companies.  You will be contacted with the lab results as soon as they are available. The fastest way to get your results is to activate your My Chart account. Instructions are located on the last page of this paperwork. If you have not heard from [place] regarding the results in 2 weeks, please contact this office.

## 2017-12-03 NOTE — Progress Notes (Signed)
MRN: [id number] DOB: [birth year]  Subjective:   Frederick Baker is a 57 y.o. male presenting for chief complaint of congestion x 2 weeks (father sick with pneumonia, ? coworker sick at work (positive of flu) Frederick Baker to a UC tested negative a week ago for flu, went to work on yesterday.  Woke up last night very warm to touch no temps taken, bodyaches, congestion, cough, rn, st 1 week ago and woke one day and st was gone.  Taking mucinex for symptoms.  Sick over weekend, can't shake cough and chest congestion.) and mucus drainage (blood in mucus the first week, now green and clear alternating.) .  Reports 2 week history of illness. Started out with nasal drainage, scratchy throat, got a little better. Then developed cough. Had flu test one week ago at urgent care and it was negative. Since then the cough has persisted. It is mostly dry and hacking but will occasionally be productive. Has had subjective fever. Has tried mucinex and cough syrup with no full relief. Denies wheezing, shortness of breath, chest pain and myalgia, nausea, vomiting, abdominal pain and diarrhea. Has had sick contact with father and coworkers. No history of seasonal allergies, no history of asthma. Patient has had flu shot this season. Denies smoking. Denies any other aggravating or relieving factors, no other questions or concerns.  Frederick Baker has a current medication list which includes the following prescription(s): alirocumab, aspirin, clopidogrel, losartan, metoprolol tartrate, and nitroglycerin. Also is allergic to lisinopril; other; and statins.  Frederick Baker  has a past medical history of CAD (coronary artery disease), HTN (hypertension), Hyperlipidemia, and Kidney stone. Also  has a past surgical history that includes None; left heart catheterization with coronary angiogram (N/A, 12/13/2012); and percutaneous coronary stent intervention (pci-s) (N/A, 12/13/2012).   Objective:   Vitals: BP 131/70 (BP Location: Left Arm, Patient  Position: Sitting, Cuff Size: Large)   Pulse 60   Temp 98 F (36.7 C) (Oral)   Resp 17   Ht 5\' 9"  (1.753 m)   Wt 250 lb (113.4 kg)   SpO2 95%   BMI 36.92 kg/m   Physical Exam  Constitutional: He is oriented to person, place, and time. He appears well-developed and well-nourished. No distress.  HENT:  Head: Normocephalic and atraumatic.  Right Ear: Tympanic membrane, external ear and ear canal normal.  Left Ear: Tympanic membrane, external ear and ear canal normal.  Nose: Mucosal edema present. Right sinus exhibits no maxillary sinus tenderness and no frontal sinus tenderness. Left sinus exhibits no maxillary sinus tenderness and no frontal sinus tenderness.  Mouth/Throat: Uvula is midline, oropharynx is clear and moist and mucous membranes are normal. No tonsillar exudate.  Eyes: Conjunctivae are normal.  Neck: Normal range of motion.  Cardiovascular: Normal rate, regular rhythm, normal heart sounds and intact distal pulses.  Pulmonary/Chest: Effort normal. No accessory muscle usage. No respiratory distress. He has no decreased breath sounds. He has no wheezes. He has rhonchi (cleared with cough) in the left upper field. He has no rales.  Lymphadenopathy:       Head (right side): No submental, no submandibular, no tonsillar, no preauricular, no posterior auricular and no occipital adenopathy present.       Head (left side): No submental, no submandibular, no tonsillar, no preauricular, no posterior auricular and no occipital adenopathy present.       Right: No supraclavicular adenopathy present.       Left: No supraclavicular adenopathy present.  Neurological: He is alert and  oriented to person, place, and time.  Skin: Skin is warm and dry.  Psychiatric: He has a normal mood and affect.  Vitals reviewed.   Results for orders placed or performed in visit on 12/03/17 (from the past 24 hour(s))  POCT CBC     Status: Abnormal   Collection Time: 12/03/17  3:08 PM  Result Value Ref  Range   WBC 9.6 4.6 - 10.2 K/uL   Lymph, poc 2.9 0.6 - 3.4   POC LYMPH PERCENT 30.3 10 - 50 %L   MID (cbc) 0.8 0 - 0.9   POC MID % 8.1 0 - 12 %M   POC Granulocyte 5.9 2 - 6.9   Granulocyte percent 61.6 37 - 80 %G   RBC 4.54 (A) 4.69 - 6.13 M/uL   Hemoglobin 15.0 14.1 - 18.1 g/dL   HCT, POC 43.7 43.5 - 53.7 %   MCV 96.3 80 - 97 fL   MCH, POC 33.2 (A) 27 - 31.2 pg   MCHC 34.5 31.8 - 35.4 g/dL   RDW, POC 12.2 %   Platelet Count, POC 331 142 - 424 K/uL   MPV 7.3 0 - 99.8 fL   Dg Chest 2 View  Result Date: 12/03/2017 CLINICAL DATA:  Cough for 2 weeks.  Productive cough, fever EXAM: CHEST - 2 VIEW COMPARISON:  None. FINDINGS: The heart size and mediastinal contours are within normal limits. Both lungs are clear. The visualized skeletal structures are unremarkable. IMPRESSION: No active cardiopulmonary disease. Electronically Signed   By: Kathreen Devoid   On: 12/03/2017 15:13    Assessment and Plan :  1. Cough - DG Chest 2 View; Future - POCT CBC 2. Acute bronchitis, unspecified organism Pt is overall well appearing, no distress. Vitals stable. Few rhonchi noted on lung exam, cleared with cough.  Chest x-ray with no active cardiopulmonary disease.  WBC WNL.  Labs and x-ray reassuring. Contacted pt via phone and informed him that history physical exam, and lab/xray findings are most consistent with acute bronchitis.  Likely viral etiology.  Will treat symptomatically at this time.  Advised to return to clinic if symptoms worsen, do not improve in 5-7 days, or as needed. If not improvement at that time, can consider abx course to treat for underlying atypical lung bacteria.  - benzonatate (TESSALON) 100 MG capsule; Take 1-2 capsules (100-200 mg total) by mouth 3 (three) times daily as needed for cough.  Dispense: 40 capsule; Refill: 0  Tenna Delaine, PA-C  [facility] 12/03/2017 3:25 PM

## 2017-12-04 ENCOUNTER — Encounter: Payer: Self-pay | Admitting: Physician Assistant

## 2018-01-23 ENCOUNTER — Ambulatory Visit: Payer: BLUE CROSS/BLUE SHIELD | Admitting: Cardiovascular Disease

## 2018-01-23 ENCOUNTER — Encounter: Payer: Self-pay | Admitting: Cardiovascular Disease

## 2018-01-23 VITALS — BP 122/70 | HR 68 | Ht 69.0 in | Wt 248.0 lb

## 2018-01-23 DIAGNOSIS — I2510 Atherosclerotic heart disease of native coronary artery without angina pectoris: Secondary | ICD-10-CM

## 2018-01-23 DIAGNOSIS — I10 Essential (primary) hypertension: Secondary | ICD-10-CM

## 2018-01-23 DIAGNOSIS — E785 Hyperlipidemia, unspecified: Secondary | ICD-10-CM

## 2018-01-23 NOTE — Patient Instructions (Signed)

## 2018-01-23 NOTE — Progress Notes (Signed)
Chief Complaint  Patient presents with  . Follow-up    CAD    History of Present Illness: 57 yo male with history of CAD, HTN, HLD who is here today for followup. He was admitted to [facility] 12/13/12 with an inferior STEMI. The RCA was occluded distally. I placed a drug eluting stent in the posterolateral branch and a drug eluting stent in the distal RCA. Echo 12/15/12 with LVEF=55% post inferior MI with inferobasal hypokinesis. He has not tolerated statins in the past despite many attempts in primary care with many different meds and different dosages. He is on Praluent injections. Nuclear stress test in 2016 showed no ischemia. Echo September 2018 with normal LV function, LVEF=55%, no significant valve disease.   He is here today for follow up. The patient denies any chest pain, dyspnea, palpitations, lower extremity edema, orthopnea, PND, dizziness, near syncope or syncope.    Primary Care Physician: Wendie Agreste, MD   Past Medical History:  Diagnosis Date  . CAD (coronary artery disease)    a. 11/2012 Acute Inf STEMI/Cath/PCI: LM nl, LAD nl, LCX 30p, OM1 30p, RCA 20p, 42m, 95d (3.5x12 promus premier DES), PDA 15m (2.36mm vessel), RPL 100 (2.25x12 promus premier DES), EF 60%;  b. 11/2012 Echo: EF 55%, mild LVH.  Marland Kitchen HTN (hypertension)   . Hyperlipidemia   . Kidney stone     Past Surgical History:  Procedure Laterality Date  . LEFT HEART CATHETERIZATION WITH CORONARY ANGIOGRAM N/A 12/13/2012   Procedure: LEFT HEART CATHETERIZATION WITH CORONARY ANGIOGRAM;  Surgeon: Burnell Blanks, MD;  Location: [facility] CATH LAB;  Service: Cardiovascular;  Laterality: N/A;  . None    . PERCUTANEOUS CORONARY STENT INTERVENTION (PCI-S) N/A 12/13/2012   Procedure: PERCUTANEOUS CORONARY STENT INTERVENTION (PCI-S);  Surgeon: Burnell Blanks, MD;  Location: [facility] CATH LAB;  Service: Cardiovascular;  Laterality: N/A;    Current Outpatient Medications  Medication Sig Dispense Refill  . Alirocumab  (PRALUENT) 150 MG/ML SOPN Inject 1 pen into the skin every 14 (fourteen) days. 2 pen 11  . aspirin 81 MG tablet Take 81 mg by mouth daily.    . clopidogrel (PLAVIX) 75 MG tablet Take 1 tablet (75 mg total) by mouth daily. 90 tablet 3  . losartan (COZAAR) 25 MG tablet TAKE 1 TABLET (25 MG TOTAL) BY MOUTH DAILY. 90 tablet 3  . metoprolol tartrate (LOPRESSOR) 25 MG tablet TAKE 1 TABLET (25 MG TOTAL) BY MOUTH 2 (TWO) TIMES DAILY. 180 tablet 3  . nitroGLYCERIN (NITROSTAT) 0.4 MG SL tablet Place 1 tablet (0.4 mg total) under the tongue every 5 (five) minutes as needed for chest pain. 25 tablet 6   No current facility-administered medications for this visit.     Allergies  Allergen Reactions  . Lisinopril     cough  . Other     Mushrooms - GI Problems  . Statins     Myalgias     Social History   Socioeconomic History  . Marital status: Single    Spouse name: Not on file  . Number of children: Not on file  . Years of education: Not on file  . Highest education level: Not on file  Occupational History  . Occupation: Works in Equities trader: [employer]  . Financial resource strain: Not on file  . Food insecurity:    Worry: Not on file    Inability: Not on file  . Transportation needs:  Medical: Not on file    Non-medical: Not on file  Tobacco Use  . Smoking status: Never Smoker  . Smokeless tobacco: Never Used  Substance and Sexual Activity  . Alcohol use: Yes  . Drug use: No  . Sexual activity: Not on file  Lifestyle  . Physical activity:    Days per week: Not on file    Minutes per session: Not on file  . Stress: Not on file  Relationships  . Social connections:    Talks on phone: Not on file    Gets together: Not on file    Attends religious service: Not on file    Active member of club or organization: Not on file    Attends meetings of clubs or organizations: Not on file    Relationship status: Not on file  . Intimate partner violence:      Fear of current or ex partner: Not on file    Emotionally abused: Not on file    Physically abused: Not on file    Forced sexual activity: Not on file  Other Topics Concern  . Not on file  Social History Narrative  . Not on file    Family History  Problem Relation Age of Onset  . Stroke Mother   . Hypertension Mother   . Hypertension Father   . CAD Paternal Uncle   . Cancer Brother     Review of Systems:  As stated in the HPI and otherwise negative.   BP 122/70   Pulse 68   Ht 5\' 9"  (1.753 m)   Wt 248 lb (112.5 kg)   SpO2 94%   BMI 36.62 kg/m   Physical Examination:  General: Well developed, well nourished, NAD  HEENT: OP clear, mucus membranes moist  SKIN: warm, dry. No rashes. Neuro: No focal deficits  Musculoskeletal: Muscle strength 5/5 all ext  Psychiatric: Mood and affect normal  Neck: No JVD, no carotid bruits, no thyromegaly, no lymphadenopathy.  Lungs:Clear bilaterally, no wheezes, rhonci, crackles Cardiovascular: Regular rate and rhythm. No murmurs, gallops or rubs. Abdomen:Soft. Bowel sounds present. Non-tender.  Extremities: No lower extremity edema. Pulses are 2 + in the bilateral DP/PT.  Cardiac cath 12/13/12: Left main: No obstructive disease.  Left Anterior Descending Artery: Large caliber vessel that courses to the apex. Mild diffuse plaque in the proximal, mid and distal segments.  Circumflex Artery: Moderate caliber vessel with moderate caliber first obtuse marginal branch. The proximal Circumflex and the proximal segment of the OM branch have 30% stenosis. Small to moderate caliber intermediate branch.  Right Coronary Artery: Large, dominant vessel with diffuse 20% stenosis proximal vessel, 40% stenosis mid vessel, 95% hazy stenosis distal vessel. The PDA is small in caliber (2.0 mm) and has a 95% mid stenosis. The posterolateral branch is moderate in caliber and has a 100% total occlusion.  Left Ventricular Angiogram: LVEF 60%  EKG:  EKG is  not ordered today. The ekg ordered today demonstrates   Recent Labs: 12/03/2017: Hemoglobin 15.0   Lipid Panel    Component Value Date/Time   CHOL 124 09/05/2017 0851   TRIG 212 (H) 09/05/2017 0851   HDL 41 09/05/2017 0851   CHOLHDL 3.0 09/05/2017 0851   CHOLHDL 2.9 03/29/2016 0752   VLDL 27 03/29/2016 0752   LDLCALC 41 09/05/2017 0851     Wt Readings from Last 3 Encounters:  01/23/18 248 lb (112.5 kg)  12/03/17 250 lb (113.4 kg)  06/18/17 236 lb 12.8 oz (107.4 kg)  Other studies Reviewed: Additional studies/ records that were reviewed today include: . Review of the above records demonstrates:    Assessment and Plan:   1. CAD without angina: NO chest pain.  Echo in September 2018 with normal LV systolic function. Will continue ASA, Plavix, beta blocker and Praluent. He is intolerant of statins.    2. HTN: BP is well controlled. No changes.   3. HLD: LDL is well controlled. Continue Praluent.   Current medicines are reviewed at length with the patient today.  The patient does not have concerns regarding medicines.  The following changes have been made:  no change  Labs/ tests ordered today include:   No orders of the defined types were placed in this encounter.   Disposition:   FU with me in 12  months  Signed, Lauree Chandler, MD 01/23/2018 4:41 PM    [facility] [place], [place],   [address] Phone: [phone]; Fax: [phone]

## 2018-02-04 ENCOUNTER — Other Ambulatory Visit: Payer: Self-pay | Admitting: Pharmacist

## 2018-02-04 MED ORDER — ALIROCUMAB 150 MG/ML ~~LOC~~ SOPN: 1.0000 "pen " | PEN_INJECTOR | SUBCUTANEOUS | 11 refills | Status: DC

## 2018-02-07 ENCOUNTER — Telehealth: Payer: Self-pay | Admitting: Pharmacist

## 2018-02-07 NOTE — Telephone Encounter (Signed)
Patient has a question related to Praluent but will like to talk directly with [facility].  Request call back once she return to office.

## 2018-02-11 NOTE — Telephone Encounter (Signed)
Rep for Praluent has also been unable to locate [facility] local pharmacy letter. Pt ok to continue filling with specialty pharmacy.

## 2018-02-11 NOTE — Telephone Encounter (Signed)
Returned call to pt - he inquired if he left a letter from [facility] when he was in last week. He has been unsuccessful in filling his Praluent at his local pharmacy because his insurance is still mandating that he fill at specialty pharmacy. Do not see that pt left his letter behind. Will send a message to local Praluent rep to see if they have access to [facility] details regarding pharmacy preference.

## 2018-02-25 ENCOUNTER — Other Ambulatory Visit: Payer: Self-pay | Admitting: Cardiovascular Disease

## 2018-07-04 DIAGNOSIS — H524 Presbyopia: Secondary | ICD-10-CM | POA: Diagnosis not present

## 2018-07-04 DIAGNOSIS — H2513 Age-related nuclear cataract, bilateral: Secondary | ICD-10-CM | POA: Diagnosis not present

## 2018-07-04 DIAGNOSIS — I708 Atherosclerosis of other arteries: Secondary | ICD-10-CM | POA: Diagnosis not present

## 2018-07-04 DIAGNOSIS — H35033 Hypertensive retinopathy, bilateral: Secondary | ICD-10-CM | POA: Diagnosis not present

## 2018-07-04 DIAGNOSIS — H25013 Cortical age-related cataract, bilateral: Secondary | ICD-10-CM | POA: Diagnosis not present

## 2018-08-26 ENCOUNTER — Other Ambulatory Visit: Payer: Self-pay | Admitting: Cardiovascular Disease

## 2018-08-28 ENCOUNTER — Other Ambulatory Visit: Payer: Self-pay | Admitting: Cardiovascular Disease

## 2018-08-28 MED ORDER — LOSARTAN POTASSIUM 25 MG PO TABS: ORAL_TABLET | ORAL | 1 refills | Status: DC

## 2018-08-28 MED ORDER — METOPROLOL TARTRATE 25 MG PO TABS: ORAL_TABLET | ORAL | 0 refills | Status: DC

## 2018-08-28 MED ORDER — CLOPIDOGREL BISULFATE 75 MG PO TABS: 75.0000 mg | ORAL_TABLET | Freq: Every day | ORAL | 1 refills | Status: DC

## 2018-08-28 NOTE — Addendum Note (Signed)
Addended by: Juventino Slovak on: 08/28/2018 04:50 PM   Modules accepted: Orders

## 2018-09-01 ENCOUNTER — Telehealth: Payer: Self-pay | Admitting: Cardiovascular Disease

## 2018-09-01 NOTE — Telephone Encounter (Signed)
Pt's pharmacy [facility] pharmacy calling requesting a refill on praluent. Pharmacy left phone number to call in [phone], option 1, option 6, ID number [id number]. Please address

## 2018-09-02 MED ORDER — ALIROCUMAB 150 MG/ML ~~LOC~~ SOAJ: 150.0000 mg | SUBCUTANEOUS | 3 refills | Status: DC

## 2018-09-02 NOTE — Telephone Encounter (Signed)
RX refill sent as requested.  

## 2018-09-10 ENCOUNTER — Telehealth: Payer: Self-pay | Admitting: Pharmacist

## 2018-09-10 DIAGNOSIS — E785 Hyperlipidemia, unspecified: Secondary | ICD-10-CM

## 2018-09-10 NOTE — Telephone Encounter (Signed)
LMOM for pt - insurance will now only cover Repatha so pt will need to change from Praluent to [place]. Need updated lipid panel so that we can submit new prior authorization.

## 2018-09-10 NOTE — Telephone Encounter (Addendum)
Pt returned call - new insurance with [employer] active 08/24/18.  ID: [id number] BIN: [id number] PCN: [id number] GRP: [id number]  Provider phone #: [phone]  He will come in 12/19 for fasting labs, then will send in PA for Praluent, hopefully [employer] will not require switch to [place]. He also needs a sample of Praluent 150 when he comes in for lab work tomorrow morning at 7:30am.

## 2018-09-10 NOTE — Addendum Note (Signed)
Addended by: SUPPLE, MEGAN E on: 09/10/2018 01:24 PM   Modules accepted: Orders

## 2018-09-11 ENCOUNTER — Other Ambulatory Visit: Payer: Managed Care, Other (non HMO)

## 2018-09-11 DIAGNOSIS — E785 Hyperlipidemia, unspecified: Secondary | ICD-10-CM

## 2018-09-11 LAB — LIPID PANEL
Chol/HDL Ratio: 3.8 ratio (ref 0.0–5.0)
Cholesterol, Total: 141 mg/dL (ref 100–199)
HDL: 37 mg/dL — ABNORMAL LOW (ref >39)
LDL Calculated: 67 mg/dL (ref 0–99)
Triglycerides: 183 mg/dL — ABNORMAL HIGH (ref 0–149)
VLDL Cholesterol Cal: 37 mg/dL (ref 5–40)

## 2018-09-11 LAB — HEPATIC FUNCTION PANEL
ALT: 40 IU/L (ref 0–44)
AST: 24 IU/L (ref 0–40)
Albumin: 4.4 g/dL (ref 3.5–5.5)
Alkaline Phosphatase: 49 IU/L (ref 39–117)
Bilirubin Total: 0.5 mg/dL (ref 0.0–1.2)
Bilirubin, Direct: 0.13 mg/dL (ref 0.00–0.40)
Total Protein: 7 g/dL (ref 6.0–8.5)

## 2018-09-19 NOTE — Telephone Encounter (Addendum)
Praluent PA denied bc Frederick Baker will only cover Repatha now, even though pt has been on Praluent for 4 years with excellent LDL lowering. New PA submitted for Repatha.

## 2018-09-22 MED ORDER — EVOLOCUMAB 140 MG/ML ~~LOC~~ SOAJ: 1.0000 "pen " | SUBCUTANEOUS | 11 refills | Status: DC

## 2018-09-22 NOTE — Telephone Encounter (Signed)
Repatha PA covered, LMOM for pt to advise him of insurance-mandated switch from Praluent to [place]. Rx sent to specialty pharmacy.

## 2018-09-22 NOTE — Telephone Encounter (Signed)
Spoke with pt, he states he will need to fill at [facility]. Rx sent there and new copay card activated, pt provided with info for pharmacy. He will call with any concerns.

## 2018-09-22 NOTE — Addendum Note (Signed)
Addended by: SUPPLE, MEGAN E on: 09/22/2018 03:06 PM   Modules accepted: Orders

## 2018-09-22 NOTE — Addendum Note (Signed)
Addended by: SUPPLE, MEGAN E on: 09/22/2018 02:39 PM   Modules accepted: Orders

## 2018-09-25 MED ORDER — EVOLOCUMAB 140 MG/ML ~~LOC~~ SOAJ: 1.0000 "pen " | SUBCUTANEOUS | 11 refills | Status: DC

## 2018-09-25 NOTE — Telephone Encounter (Signed)
Pt called back and stated [facility] will not fill Repatha and he can use local pharmacy, rx sent to [facility] per pt preference.

## 2018-09-25 NOTE — Addendum Note (Signed)
Addended by: Asser Lucena E on: 09/25/2018 08:27 AM   Modules accepted: Orders

## 2018-10-02 ENCOUNTER — Telehealth: Payer: Self-pay | Admitting: Pharmacist

## 2018-10-02 NOTE — Telephone Encounter (Signed)
Pt calling requesting copay card to be sent to [facility]. Activated and called card to [facility] in [place].copay $5

## 2019-01-19 ENCOUNTER — Other Ambulatory Visit: Payer: Self-pay | Admitting: Cardiovascular Disease

## 2019-01-22 ENCOUNTER — Telehealth: Payer: Self-pay

## 2019-01-22 NOTE — Telephone Encounter (Signed)

## 2019-01-26 NOTE — Progress Notes (Signed)
Virtual Visit via Video Note   This visit type was conducted due to national recommendations for restrictions regarding the COVID-19 Pandemic (e.g. social distancing) in an effort to limit this patient's exposure and mitigate transmission in our community.  Due to his co-morbid illnesses, this patient is at least at moderate risk for complications without adequate follow up.  This format is felt to be most appropriate for this patient at this time.  All issues noted in this document were discussed and addressed.  A limited physical exam was performed with this format.  Please refer to the patient's chart for his consent to telehealth for [facility]   Date:  01/27/2019   ID:  Frederick Baker, DOB 06-28-1961, MRN [id number]  Patient Location: Home Provider Location: Home  PCP:  Wendie Agreste, MD  Cardiologist:  Lauree Chandler, MD  Electrophysiologist:  None   Evaluation Performed:  Follow-Up Visit  Chief Complaint:  F/U  History of Present Illness:    Frederick Baker is a 58 y.o. male with with history of CAD status post inferior STEMI with total RCA, status post DES to the PLA and distal RCA 2014.  Echo at that time LVEF 55% with posterior inferior basal hypokinesis.  NST in 2016 no ischemia, echo 2018 normal LVEF 55%.  Patient also has history of hypertension, HLD.  Last saw Dr. Angelena Form 01/23/2018 and was doing well on Praluent.  Patient says he feels like he has to take a deep breath and is yawning a lot usually while watching tv at night. Had a few years ago and saw a pulmonologist and had an echo and nothing found. No palpitations. Exercises 10 min on treadmill and rowing machine a couple days a week. 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 No chest tightness or shortness of breath with activity. No social distancing at his work and not wearing a mask. Has a little twinge-sharp shooting pain that comes and goes. Thinks he may have strained himself with heavy  lifting. Has has this sensation with a kidney stone in the past as well.  The patient does not have symptoms concerning for COVID-19 infection (fever, chills, cough, or new shortness of breath).    Past Medical History:  Diagnosis Date  . CAD (coronary artery disease)    a. 11/2012 Acute Inf STEMI/Cath/PCI: LM nl, LAD nl, LCX 30p, OM1 30p, RCA 20p, 22m 95d (3.5x12 promus premier DES), PDA 962m2.71m56messel), RPL 100 (2.25x12 promus premier DES), EF 60%;  b. 11/2012 Echo: EF 55%, mild LVH.  . HMarland KitchenN (hypertension)   . Hyperlipidemia   . Kidney stone    Past Surgical History:  Procedure Laterality Date  . LEFT HEART CATHETERIZATION WITH CORONARY ANGIOGRAM N/A 12/13/2012   Procedure: LEFT HEART CATHETERIZATION WITH CORONARY ANGIOGRAM;  Surgeon: ChrBurnell BlanksD;  [facility];  Service: Cardiovascular;  Laterality: N/A;  . None    . PERCUTANEOUS CORONARY STENT INTERVENTION (PCI-S) N/A 12/13/2012   Procedure: PERCUTANEOUS CORONARY STENT INTERVENTION (PCI-S);  Surgeon: ChrBurnell BlanksD;  [facility];  Service: Cardiovascular;  Laterality: N/A;     Current Meds  Medication Sig  . aspirin 81 MG tablet Take 81 mg by mouth daily.  . clopidogrel (PLAVIX) 75 MG tablet Take 1 tablet (75 mg total) by mouth daily.  . Evolocumab (REPATHA SURECLICK) 140096/ML SOAJ Inject 1 pen into the skin every 14 (fourteen) days.  . lMarland Kitchensartan (COZAAR) 25 MG tablet  TAKE 1 TABLET (25 MG TOTAL) BY MOUTH DAILY.  . metoprolol tartrate (LOPRESSOR) 25 MG tablet TAKE 1 TABLET BY MOUTH TWICE DAILY  . nitroGLYCERIN (NITROSTAT) 0.4 MG SL tablet Place 1 tablet (0.4 mg total) under the tongue every 5 (five) minutes as needed for chest pain.  . [DISCONTINUED] nitroGLYCERIN (NITROSTAT) 0.4 MG SL tablet Place 1 tablet (0.4 mg total) under the tongue every 5 (five) minutes as needed for chest pain.     Allergies:   Lisinopril; Other; and Statins   Social History   Tobacco Use  . Smoking  status: Never Smoker  . Smokeless tobacco: Never Used  Substance Use Topics  . Alcohol use: Yes  . Drug use: No     Family Hx: The patient's family history includes CAD in his paternal uncle; Cancer in his brother; Hypertension in his father and mother; Stroke in his mother.  ROS:   Please see the history of present illness.    Review of Systems  Constitution: Negative.  HENT: Negative.   Cardiovascular: Positive for dyspnea on exertion.  Respiratory: Negative.   Endocrine: Negative.   Hematologic/Lymphatic: Negative.   Musculoskeletal: Negative.   Gastrointestinal: Negative.   Genitourinary: Negative.   Neurological: Negative.     All other systems reviewed and are negative.   Prior CV studies:   The following studies were reviewed today:  Cardiac cath 12/13/12: Left main: No obstructive disease.  Left Anterior Descending Artery: Large caliber vessel that courses to the apex. Mild diffuse plaque in the proximal, mid and distal segments.  Circumflex Artery: Moderate caliber vessel with moderate caliber first obtuse marginal branch. The proximal Circumflex and the proximal segment of the OM branch have 30% stenosis. Small to moderate caliber intermediate branch.  Right Coronary Artery: Large, dominant vessel with diffuse 20% stenosis proximal vessel, 40% stenosis mid vessel, 95% hazy stenosis distal vessel. The PDA is small in caliber (2.0 mm) and has a 95% mid stenosis. The posterolateral branch is moderate in caliber and has a 100% total occlusion.  Left Ventricular Angiogram: LVEF 60%  5/2018Study Conclusions   - Left ventricle: The cavity size was normal. Systolic function was   normal. The estimated ejection fraction was in the range of 55%   to 60%. Wall motion was normal; there were no regional wall   motion abnormalities. Left ventricular diastolic function   parameters were normal. - Aortic valve: There was trivial regurgitation. - Left atrium: The atrium was  mildly dilated. - Atrial septum: No defect or patent foramen ovale was identified.     Labs/Other Tests and Data Reviewed:    EKG:  An ECG dated 05/29/17 was personally reviewed today and demonstrated:  sinus bradycardia at 59/m with old inf infarct  Recent Labs: 09/11/2018: ALT 40   Recent Lipid Panel Lab Results  Component Value Date/Time   CHOL 141 09/11/2018 08:01 AM   TRIG 183 (H) 09/11/2018 08:01 AM   HDL 37 (L) 09/11/2018 08:01 AM   CHOLHDL 3.8 09/11/2018 08:01 AM   CHOLHDL 2.9 03/29/2016 07:52 AM   LDLCALC 67 09/11/2018 08:01 AM    Wt Readings from Last 3 Encounters:  01/27/19 232 lb (105.2 kg)  01/23/18 248 lb (112.5 kg)  12/03/17 250 lb (113.4 kg)     Objective:    Vital Signs:  Ht _0  (1.753 m)   Wt 232 lb (105.2 kg)   BMI 34.26 kg/m  232 lbs  VITAL SIGNS:  reviewed GEN:  no acute distress RESPIRATORY:  normal respiratory effort, symmetric expansion CARDIOVASCULAR:  no peripheral edema  ASSESSMENT & PLAN:     CAD status post inferior STEMI with total RCA, status post DES to the PLA and distal RCA 2014. On Plavix and ASA- no bleeding problems. No angina. Twinges in left chest sound M-S.  F/U with Dr. Angelena Form in 4 months with blood work.   Essential hypertension-hasn't been checking BP  Hyperlipidemia on Repatha DL 67 triglycerides 183 08/2018  Short of breath at rest-snoring and sensation that he needs to take a deep breath, yawning.  BMI high. Will order sleep study.    COVID-19 Education: The signs and symptoms of COVID-19 were discussed with the patient and how to seek care for testing (follow up with PCP or arrange E-visit).   The importance of social distancing was discussed today.  Time:   Today, I have spent 25 minutes with the patient with telehealth technology discussing the above problems.     Medication Adjustments/Labs and Tests Ordered: Current medicines are reviewed at length with the patient today.  Concerns regarding medicines  are outlined above.   Tests Ordered: Orders Placed This Encounter  Procedures  . CBC  . Comp Met (CMET)  . [place] Sleep Study    Medication Changes: Meds ordered this encounter  Medications  . nitroGLYCERIN (NITROSTAT) 0.4 MG SL tablet    Sig: Place 1 tablet (0.4 mg total) under the tongue every 5 (five) minutes as needed for chest pain.    Dispense:  25 tablet    Refill:  6    Disposition:  Follow up in 4 month(s) Dr. Angelena Form  Signed, Ermalinda Barrios, PA-C  01/27/2019 9:58 AM    [place]

## 2019-01-27 ENCOUNTER — Other Ambulatory Visit: Payer: Self-pay

## 2019-01-27 ENCOUNTER — Telehealth (INDEPENDENT_AMBULATORY_CARE_PROVIDER_SITE_OTHER): Payer: Managed Care, Other (non HMO) | Admitting: Physician Assistant

## 2019-01-27 ENCOUNTER — Encounter: Payer: Self-pay | Admitting: Physician Assistant

## 2019-01-27 VITALS — Ht 69.0 in | Wt 232.0 lb

## 2019-01-27 DIAGNOSIS — R0683 Snoring: Secondary | ICD-10-CM

## 2019-01-27 DIAGNOSIS — E785 Hyperlipidemia, unspecified: Secondary | ICD-10-CM

## 2019-01-27 DIAGNOSIS — I10 Essential (primary) hypertension: Secondary | ICD-10-CM

## 2019-01-27 DIAGNOSIS — I2510 Atherosclerotic heart disease of native coronary artery without angina pectoris: Secondary | ICD-10-CM | POA: Diagnosis not present

## 2019-01-27 DIAGNOSIS — R0602 Shortness of breath: Secondary | ICD-10-CM

## 2019-01-27 MED ORDER — NITROGLYCERIN 0.4 MG SL SUBL: 0.4000 mg | SUBLINGUAL_TABLET | SUBLINGUAL | 6 refills | Status: DC | PRN

## 2019-01-27 NOTE — Patient Instructions (Addendum)
Medication Instructions:  Your physician recommends that you continue on your current medications as directed. Please refer to the Current Medication list given to you today.  If you need a refill on your cardiac medications before your next appointment, please call your pharmacy.   Lab work: FUTURE: CBC & CMET IN 4 MONTHS   If you have labs (blood work) drawn today and your tests are completely normal, you will receive your results only by: Marland Kitchen MyChart Message (if you have MyChart) OR . A paper copy in the mail If you have any lab test that is abnormal or we need to change your treatment, we will call you to review the results.  Testing/Procedures: Your physician has recommended that you have a sleep study. This test records several body functions during sleep, including: brain activity, eye movement, oxygen and carbon dioxide blood levels, heart rate and rhythm, breathing rate and rhythm, the flow of air through your mouth and nose, snoring, body muscle movements, and chest and belly movement.   Follow-Up: At [facility], you and your health needs are our priority.  As part of our continuing mission to provide you with exceptional heart care, we have created designated Provider Care Teams.  These Care Teams include your primary Cardiologist (physician) and Advanced Practice Providers (APPs -  Physician Assistants and Nurse Practitioners) who all work together to provide you with the care you need, when you need it. You will need a follow up appointment in 4 months.  Please call our office 2 months in advance to schedule this appointment.  You may see Lauree Chandler, MD or one of the following Advanced Practice Providers on your designated Care Team:   Blair, PA-C Melina Copa, PA-C . Ermalinda Barrios, PA-C  Any Other Special Instructions Will Be Listed Below (If Applicable). Patient Name: Frederick Baker        DOB: November 06, 1960      Height:5'9     Weight: 232lbs  Office  Name: [place]          Referring Provider: Ermalinda Barrios PA-C  Today's Date:01/27/2019  Date:   STOP BANG RISK ASSESSMENT S (snore) Have you been told that you snore?     YES   T (tired) Are you often tired, fatigued, or sleepy during the day?   YES  O (obstruction) Do you stop breathing, choke, or gasp during sleep? NO   P (pressure) Do you have or are you being treated for high blood pressure? YES   B (BMI) Is your body index greater than 35 kg/m? YES   A (age) Are you 65 years old or older? YES   N (neck) Do you have a neck circumference greater than 16 inches?   YES   G (gender) Are you a male? YES   TOTAL STOP/BANG "YES" ANSWERS                                                                        For Office Use Only              Procedure Order Form    YES to 3+ Stop Bang questions OR two clinical symptoms - patient qualifies for WatchPAT (CPT 95800)  Submit: This Form + Patient Face Sheet + Clinical Note via CloudPAT or Fax: [phone]         Clinical Notes: Will consult Sleep Specialist and refer for management of therapy due to patient increased risk of Sleep Apnea. Ordering a sleep study due to the following two clinical symptoms: Excessive daytime sleepiness G47.10 / Gastroesophageal reflux K21.9 / Nocturia R35.1 / Morning Headaches G44.221 / Difficulty concentrating R41.840 / Memory problems or poor judgment G31.84 / Personality changes or irritability R45.4 / Loud snoring R06.83 / Depression F32.9 / Unrefreshed by sleep G47.8 / Impotence N52.9 / History of high blood pressure R03.0 / Insomnia G47.00

## 2019-01-27 NOTE — Addendum Note (Signed)
Addended by: [name] on: 01/27/2019 11:15 AM   Modules accepted: Orders

## 2019-02-04 ENCOUNTER — Telehealth: Payer: Self-pay | Admitting: *Deleted

## 2019-02-04 NOTE — Telephone Encounter (Signed)
-----   Message from [name], [place] sent at 01/27/2019  9:55 AM EDT ----- Regarding: In home sleep Frederick Baker [phone]  Pt has been ordered to have a in home sleep study  DX: Snoring and SOB  Thanks

## 2019-02-04 NOTE — Telephone Encounter (Addendum)
Patient is aware and agreeable to Home Sleep Study through [facility]. Patient is scheduled for 03/06/19 at 10:30 to pick up home sleep kit and meet with Respiratory therapist at [facility]. Patient is aware that if this appointment date and time does not work for them they should contact Artis Delay directly at [phone]. Patient is aware that a sleep packet will be sent from [facility] in week. Patient is agreeable to treatment and thankful for call

## 2019-02-23 ENCOUNTER — Telehealth: Payer: Self-pay | Admitting: *Deleted

## 2019-02-23 NOTE — Telephone Encounter (Signed)
PA for HST submitted to [facility] via web portal.

## 2019-02-23 NOTE — Telephone Encounter (Signed)
-----   Message from [name], [place] sent at 01/27/2019  9:55 AM EDT ----- Regarding: In home sleep Frederick Baker [phone]  Pt has been ordered to have a in home sleep study  DX: Snoring and SOB  Thanks

## 2019-02-25 ENCOUNTER — Encounter (HOSPITAL_BASED_OUTPATIENT_CLINIC_OR_DEPARTMENT_OTHER): Payer: Managed Care, Other (non HMO)

## 2019-02-27 ENCOUNTER — Telehealth: Payer: Self-pay | Admitting: *Deleted

## 2019-02-27 NOTE — Telephone Encounter (Signed)
Staff message sent to [place] received. Ok to schedule HST auth # [id number]. Valid dates 02/23/19 to 04/08/19

## 2019-02-27 NOTE — Telephone Encounter (Signed)
-----   Message from [name], [place] sent at 01/27/2019  9:55 AM EDT ----- Regarding: In home sleep Frederick Baker [id number]  Pt has been ordered to have a in home sleep study  DX: Snoring and SOB  Thanks

## 2019-03-02 NOTE — Telephone Encounter (Signed)
RE: In home sleep  Lauralee Evener, CMA  Freada Bergeron, CMA        Approval received from [place]. Ok to schedule HST Auth # [id number] Valid dates 02/23/19 to 04/08/19

## 2019-03-06 ENCOUNTER — Other Ambulatory Visit: Payer: Self-pay

## 2019-03-06 ENCOUNTER — Ambulatory Visit (HOSPITAL_BASED_OUTPATIENT_CLINIC_OR_DEPARTMENT_OTHER): Payer: Managed Care, Other (non HMO) | Attending: Physician Assistant | Admitting: Cardiology

## 2019-03-06 DIAGNOSIS — G4733 Obstructive sleep apnea (adult) (pediatric): Secondary | ICD-10-CM | POA: Diagnosis present

## 2019-03-06 DIAGNOSIS — R0602 Shortness of breath: Secondary | ICD-10-CM

## 2019-03-06 DIAGNOSIS — R0683 Snoring: Secondary | ICD-10-CM

## 2019-03-09 NOTE — Procedures (Deleted)
° °Patient Name: Frederick Baker, Frederick Baker °Study Date: 02/17/2019 °Gender: Male °D.O.B: 12/19/1962 °Age (years): 56 °Referring Provider: Henry Smith °Height (inches): 71 °Interpreting Physician: Neely Kammerer MD, ABSM °Weight (lbs): 260 °RPSGT: Steffey, Kevin °BMI: 37 °MRN: [id number] °Neck Size: 18.50 ° °CLINICAL INFORMATION °Sleep Study Type: Split Night CPAP ° °Indication for sleep study: Hypertension, OSA ° °Epworth Sleepiness Score: 7 ° °SLEEP STUDY TECHNIQUE °As per the AASM Manual for the Scoring of Sleep and Associated Events v2.3 (April 2016) with a hypopnea requiring 4% desaturations. ° °The channels recorded and monitored were frontal, central and occipital EEG, electrooculogram (EOG), submentalis EMG (chin), nasal and oral airflow, thoracic and abdominal wall motion, anterior tibialis EMG, snore microphone, electrocardiogram, and pulse oximetry. Continuous positive airway pressure (CPAP) was initiated when the patient met split night criteria and was titrated according to treat sleep-disordered breathing. ° °MEDICATIONS °Medications self-administered by patient taken the night of the study : N/A ° °RESPIRATORY PARAMETERS °Diagnostic °Total AHI (/hr): 24.0  °RDI (/hr):32.6  °OA Index (/hr):4.7  °CA Index (/hr): 0.4 °REM AHI (/hr): 62.2  °NREM AHI (/hr):19.9  °Supine AHI (/hr):29.3  °Non-supine AHI (/hr):9.6 °Min O2 Sat (%):83.0  °Mean O2 (%): 93.9  °Time below 88% (min):2.2  ° °Titration °Optimal Pressure (cm):14  °AHI at Optimal Pressure (/hr):0.0  °Min O2 at Optimal Pressure (%):95.0 °Supine % at Optimal (%):100  °Sleep % at Optimal (%):99  ° °SLEEP ARCHITECTURE °The recording time for the entire night was 401 minutes. ° °During a baseline period of 154.5 minutes, the patient slept for 140.0 minutes in REM and nonREM, yielding a sleep efficiency of 90.6%. Sleep onset after lights out was 3.2 minutes with a REM latency of 72.0 minutes. The patient spent 12.5% of the night in stage N1 sleep, 77.9% in stage N2  sleep, 0.0% in stage N3 and 9.6% in REM. ° °During the titration period of 245.5 minutes, the patient slept for 88.0 minutes in REM and nonREM, yielding a sleep efficiency of 35.8%. Sleep onset after CPAP initiation was 129.2 minutes with a REM latency of 38.0 minutes. The patient spent 7.4% of the night in stage N1 sleep, 72.7% in stage N2 sleep, 0.0% in stage N3 and 19.9% in REM. ° °CARDIAC DATA °The 2 lead EKG demonstrated sinus rhythm. The mean heart rate was 100.0 beats per minute. Other EKG findings include: PVCs. ° °LEG MOVEMENT DATA °The total Periodic Limb Movements of Sleep (PLMS) were 0. The PLMS index was 0.0 . ° °IMPRESSIONS °- Moderate obstructive sleep apnea occurred during the diagnostic portion of the study(AHI = 24.0/hour). An optimal PAP pressure was selected for this patient ( 14 cm of water) °- No significant central sleep apnea occurred during the diagnostic portion of the study (CAI = 0.4/hour). °- The patient had minimal or no oxygen desaturation during the diagnostic portion of the study (Min O2 = 83.0%) °- The patient snored with loud snoring volume during the diagnostic portion of the study. °- EKG findings include PVCs. °- Clinically significant periodic limb movements did not occur during sleep. ° °DIAGNOSIS °- Obstructive Sleep Apnea (327.23 [G47.33 ICD-10]) ° °RECOMMENDATIONS °- Trial of CPAP therapy on 14 cm H2O with a Medium size Fisher&Paykel Full Face Mask Simplus mask and heated humidification. °- Avoid alcohol, sedatives and other CNS depressants that may worsen sleep apnea and disrupt normal sleep architecture. °- Sleep hygiene should be reviewed to assess factors that may improve sleep quality. °- Weight management and regular exercise should be initiated or continued. °-   Return to Sleep Center for re-evaluation after 10 weeks of therapy ° °[Electronically signed] 03/09/2019 10:10 PM ° °Tarini Carrier MD, ABSM °Diplomate, American Board of Sleep Medicine ° ° °

## 2019-03-10 NOTE — Procedures (Addendum)
    Patient Name: Frederick Baker, Frederick Baker Date: 03/08/2019 Gender: Male D.O.B: 01/27/1961 Age (years): 58 Referring Daveda Larock: Ermalinda Barrios Height (inches): 42 Interpreting Physician: Fransico Him MD, ABSM Weight (lbs): 232 RPSGT: [name] BMI: 34 MRN: [id number]  CLINICAL INFORMATION Sleep Study Type: HST  Indication for sleep study: Snoring  Epworth Sleepiness Score: 7  SLEEP STUDY TECHNIQUE A multi-channel overnight portable sleep study was performed. The channels recorded were: nasal airflow, thoracic respiratory movement, and oxygen saturation with a pulse oximetry. Snoring was also monitored.  MEDICATIONS Patient self administered medications include: N/A.  SLEEP ARCHITECTURE Patient was studied for 397.3 minutes. The sleep efficiency was 100.0 % and the patient was supine for 79.3%. The arousal index was 0.0 per hour.  RESPIRATORY PARAMETERS The overall AHI was 33.2 per hour, with a central apnea index of 0.0 per hour.  The oxygen nadir was 80% during sleep.  CARDIAC DATA Mean heart rate during sleep was 63.8 bpm.  IMPRESSIONS - Severe obstructive sleep apnea occurred during this study (AHI = 33.2/h). - No significant central sleep apnea occurred during this study (CAI = 0.0/h). - Severe oxygen desaturation was noted during this study (Min O2 = 80%). - Patient snored 27.3% during the sleep.  DIAGNOSIS - Obstructive Sleep Apnea (327.23 [G47.33 ICD-10]) - Nocturnal Hypoxemia  RECOMMENDATIONS - Recommend in lab CPAP titration due to severity of OSA and significant hypoxemia.  - Positional therapy avoiding supine position during sleep. - Avoid alcohol, sedatives and other CNS depressants that may worsen sleep apnea and disrupt normal sleep architecture. - Sleep hygiene should be reviewed to assess factors that may improve sleep quality. - Weight management and regular exercise should be initiated or continued.  [Electronically signed] 03/18/2019 05:35  PM  Fransico Him MD, ABSM Diplomate, American Board of Sleep Medicine

## 2019-03-11 ENCOUNTER — Telehealth: Payer: Self-pay | Admitting: *Deleted

## 2019-03-11 NOTE — Telephone Encounter (Addendum)
-----   Message from Sueanne Margarita, MD senT Please cancel order for CPAP -the order was created in error  Turner, Eber Hong, MD  Freada Bergeron, CMA        Please let patient know that they have significant sleep apnea and had successful PAP titration and will be set up with PAP unit. Please let DME know that order is in EPIC. Please set patient up for OV in 10 weeks

## 2019-03-18 NOTE — Procedures (Signed)
   Patient Name: Frederick Baker, Frederick Baker Date: 03/08/2019 Gender: Male D.O.B: 11-Nov-1960 Age (years): 58 Referring Provider: Ermalinda Barrios Height (inches): 69 Interpreting Physician: Fransico Him MD, ABSM Weight (lbs): 232 RPSGT: [name] BMI: 34 MRN: [id number]  CLINICAL INFORMATION Sleep Study Type: HST  Indication for sleep study: Snoring  Epworth Sleepiness Score: 7  SLEEP STUDY TECHNIQUE A multi-channel overnight portable sleep study was performed. The channels recorded were: nasal airflow, thoracic respiratory movement, and oxygen saturation with a pulse oximetry. Snoring was also monitored.  MEDICATIONS Patient self administered medications include: N/A.  SLEEP ARCHITECTURE Patient was studied for 397.3 minutes. The sleep efficiency was 100.0 % and the patient was supine for 79.3%. The arousal index was 0.0 per hour.  RESPIRATORY PARAMETERS The overall AHI was 33.2 per hour, with a central apnea index of 0.0 per hour.  The oxygen nadir was 80% during sleep.  CARDIAC DATA Mean heart rate during sleep was 63.8 bpm.  IMPRESSIONS - Severe obstructive sleep apnea occurred during this study (AHI = 33.2/h). - No significant central sleep apnea occurred during this study (CAI = 0.0/h). - Severe oxygen desaturation was noted during this study (Min O2 = 80%). - Patient snored 27.3% during the sleep. DIAGNOSIS - Obstructive Sleep Apnea (327.23 [G47.33 ICD-10]) - Nocturnal Hypoxemia  RECOMMENDATIONS - Recommend in lab CPAP titration due to severity of OSA and significant hypoxemia.  - Positional therapy avoiding supine position during sleep. - Avoid alcohol, sedatives and other CNS depressants that may worsen sleep apnea and disrupt normal sleep architecture. - Sleep hygiene should be reviewed to assess factors that may improve sleep quality. - Weight management and regular exercise should be initiated or continued.  [Electronically signed] 03/18/2019 05:35 PM  Fransico Him MD, ABSM Diplomate, American Board of Sleep Medicine

## 2019-03-20 ENCOUNTER — Telehealth: Payer: Self-pay | Admitting: *Deleted

## 2019-03-20 NOTE — Telephone Encounter (Signed)
Informed patient of sleep study results and patient understanding was verbalized. Patient understands his sleep study showed they have sleep apnea and recommend CPAP titration. Pt is aware of results but says he did not sleep well during his study and he can not sleep with anything on his face. I made him aware there were different mask but he states he wants to think about the titration and decide and call later. Patient thanked me for the call.

## 2019-03-20 NOTE — Telephone Encounter (Signed)
-----   Message from Sueanne Margarita, MD sent at 03/18/2019  5:47 PM EDT ----- Please let patient know that they have sleep apnea and recommend CPAP titration. Please set up titration in the sleep lab.

## 2019-03-24 ENCOUNTER — Telehealth: Payer: Self-pay | Admitting: Physician Assistant

## 2019-03-24 NOTE — Telephone Encounter (Signed)
Patient expresses great dissatisfaction with the sleep study that he just had done. He states that he did not sleep at all and does not feel like the results are accurate. He states that he received a bill for $400 for his sleep study and states that he does not want to set up CPAP titration because he does not want another bill. He states he cannot sleep with anything on his face. Instructed the patient to reach out to the billing department for specific questions regarding his bill.

## 2019-03-24 NOTE — Telephone Encounter (Signed)
I tried calling patient to discuss his sleep study but no answer on home or mobile phone. I'm happy to discuss with him when he calls back. We could also set up a telemedicine visit with Dr. Radford Pax who read the test to go over if he'd like

## 2019-03-24 NOTE — Telephone Encounter (Signed)
Patient would like to speak to [facility] about a test that she ordered.

## 2019-03-31 NOTE — Telephone Encounter (Signed)
Spoke with patient regarding message below from [place], [place]. Patient states that he is not sure that he can wear something over his face. Patient would like to discuss sleep study results with Dr. Radford Pax and options for his sleep apnea. Video visit scheduled for 7/22 with Dr. Radford Pax.

## 2019-04-14 ENCOUNTER — Encounter: Payer: Self-pay | Admitting: Cardiology

## 2019-04-14 DIAGNOSIS — G4733 Obstructive sleep apnea (adult) (pediatric): Secondary | ICD-10-CM

## 2019-04-14 DIAGNOSIS — E669 Obesity, unspecified: Secondary | ICD-10-CM

## 2019-04-14 HISTORY — DX: Obesity, unspecified: E66.9

## 2019-04-14 HISTORY — DX: Obstructive sleep apnea (adult) (pediatric): G47.33

## 2019-04-14 NOTE — Progress Notes (Signed)
Virtual Visit via Video Note   This visit type was conducted due to national recommendations for restrictions regarding the COVID-19 Pandemic (e.g. social distancing) in an effort to limit this patient's exposure and mitigate transmission in our community.  Due to his co-morbid illnesses, this patient is at least at moderate risk for complications without adequate follow up.  This format is felt to be most appropriate for this patient at this time.  All issues noted in this document were discussed and addressed.  A limited physical exam was performed with this format.  Please refer to the patient's chart for his consent to telehealth for [facility]   Evaluation Performed:  Follow-up visit  This visit type was conducted due to national recommendations for restrictions regarding the COVID-19 Pandemic (e.g. social distancing).  This format is felt to be most appropriate for this patient at this time.  All issues noted in this document were discussed and addressed.  No physical exam was performed (except for noted visual exam findings with Video Visits).  Please refer to the patient's chart (MyChart message for video visits and phone note for telephone visits) for the patient's consent to telehealth for [facility].  Date:  04/14/2019   ID:  Frederick Baker, DOB 07/22/1961, MRN [id number]  Patient Location:  Home  Provider location:   [place]  PCP:  Wendie Agreste, MD  Cardiologist:  Lauree Chandler, MD  Sleep Medicine:  Fransico Him, MD Electrophysiologist:  None   Chief Complaint:  OSA  History of Present Illness:    Frederick Baker is a 58 y.o. male who presents via audio/video conferencing for a telehealth visit today.    This is a 58yo male with a hx of HTN and CAD who was referred for sleep study due to snoring and HTN.  He was found to severe OSA with an AHI of 33/hr and severe nocturnal hypoxemia with O2 sats as low as 80%.  CPAP titration was recommended.   Before proceeding with CPAP titration he wanted to discuss results of sleep study.    He tells me that he is very claustraphobic and is very concerned that he will not tolerate the CPAP mask.  He is also concerned about insurance paying for the device.   The patient does not have symptoms concerning for COVID-19 infection (fever, chills, cough, or new shortness of breath).    Prior CV studies:   The following studies were reviewed today:  Sleep study  Past Medical History:  Diagnosis Date   CAD (coronary artery disease)    a. 11/2012 Acute Inf STEMI/Cath/PCI: LM nl, LAD nl, LCX 30p, OM1 30p, RCA 20p, 69m, 95d (3.5x12 promus premier DES), PDA 33m (2.101mm vessel), RPL 100 (2.25x12 promus premier DES), EF 60%;  b. 11/2012 Echo: EF 55%, mild LVH.   HTN (hypertension)    Hyperlipidemia    Kidney stone    Obesity (BMI 30-39.9) 04/14/2019   OSA (obstructive sleep apnea) 04/14/2019   severe OSA with an AHI of 33/hr and severe nocturnal hypoxemia with O2 sats as low as 80%   Past Surgical History:  Procedure Laterality Date   LEFT HEART CATHETERIZATION WITH CORONARY ANGIOGRAM N/A 12/13/2012   Procedure: LEFT HEART CATHETERIZATION WITH CORONARY ANGIOGRAM;  Surgeon: Burnell Blanks, MD;  Location: [facility] CATH LAB;  Service: Cardiovascular;  Laterality: N/A;   None     PERCUTANEOUS CORONARY STENT INTERVENTION (PCI-S) N/A 12/13/2012   Procedure: PERCUTANEOUS CORONARY STENT INTERVENTION (PCI-S);  Surgeon: Harrell Gave  Santina Evans, MD;  Location: [facility] CATH LAB;  Service: Cardiovascular;  Laterality: N/A;     No outpatient medications have been marked as taking for the 04/15/19 encounter (Appointment) with Sueanne Margarita, MD.     Allergies:   Lisinopril, Other, and Statins   Social History   Tobacco Use   Smoking status: Never Smoker   Smokeless tobacco: Never Used  Substance Use Topics   Alcohol use: Yes   Drug use: No     Family Hx: The patient's family history includes CAD in  his paternal uncle; Cancer in his brother; Hypertension in his father and mother; Stroke in his mother.  ROS:   Please see the history of present illness.     All other systems reviewed and are negative.   Labs/Other Tests and Data Reviewed:    Recent Labs: 09/11/2018: ALT 40   Recent Lipid Panel Lab Results  Component Value Date/Time   CHOL 141 09/11/2018 08:01 AM   TRIG 183 (H) 09/11/2018 08:01 AM   HDL 37 (L) 09/11/2018 08:01 AM   CHOLHDL 3.8 09/11/2018 08:01 AM   CHOLHDL 2.9 03/29/2016 07:52 AM   LDLCALC 67 09/11/2018 08:01 AM    Wt Readings from Last 3 Encounters:  01/27/19 232 lb (105.2 kg)  01/23/18 248 lb (112.5 kg)  12/03/17 250 lb (113.4 kg)     Objective:    Vital Signs:  There were no vitals taken for this visit.   CONSTITUTIONAL:  Well nourished, well developed male in no acute distress.  EYES: anicteric MOUTH: oral mucosa is pink RESPIRATORY: Normal respiratory effort, symmetric expansion CARDIOVASCULAR: No peripheral edema SKIN: No rash, lesions or ulcers MUSCULOSKELETAL: no digital cyanosis NEURO: Cranial Nerves II-XII grossly intact, moves all extremities PSYCH: Intact judgement and insight.  A&O x 3, Mood/affect appropriate   ASSESSMENT & PLAN:    1.  OSA - he was found to have severe OSA with an AHI of 33/hr and severe nocturnal hypoxemia with O2 sats as low as 80%.  I have reviewed the results of the sleep study at length with him and have recommended proceeding with PAP titration.  He says that he likely will not be able to tolerate a full face mask so I will order the CPAP titration using a nasal pillow mask.  Also he is very concerned about his out of pocket expense with the CPAP titration.  I told him I would have my nurse call the insurance to find out what his out of pocket expense would be for both the CPAP titration and the PAP device.    2.  Obesity - I have encouraged him to get into a routine exercise program and cut back on carbs and  portions.   3.  Hypertension -BP is controlled on exam today -he will continue on Losartan 25mg  daily and Lopressor 25mg  BID.  COVID-19 Education: The signs and symptoms of COVID-19 were discussed with the patient and how to seek care for testing (follow up with PCP or arrange E-visit).  The importance of social distancing was discussed today.  Patient Risk:   After full review of this patient's clinical status, I feel that they are at least moderate risk at this time.  Time:   Today, I have spent 20 minutes on telemedicine discussing medical problems including OSA.  We also reviewed the symptoms of COVID 19 and the ways to protect against contracting the virus with telehealth technology.  I spent an additional 5 minutes reviewing patient's  chart including sleeps study.  Medication Adjustments/Labs and Tests Ordered: Current medicines are reviewed at length with the patient today.  Concerns regarding medicines are outlined above.  Tests Ordered: No orders of the defined types were placed in this encounter.  Medication Changes: No orders of the defined types were placed in this encounter.   Disposition:  Follow up prn  Signed, Fransico Him, MD  04/14/2019 9:36 PM    [place]

## 2019-04-15 ENCOUNTER — Other Ambulatory Visit: Payer: Self-pay

## 2019-04-15 ENCOUNTER — Telehealth (INDEPENDENT_AMBULATORY_CARE_PROVIDER_SITE_OTHER): Payer: Managed Care, Other (non HMO) | Admitting: Cardiology

## 2019-04-15 ENCOUNTER — Telehealth: Payer: Self-pay | Admitting: *Deleted

## 2019-04-15 DIAGNOSIS — I10 Essential (primary) hypertension: Secondary | ICD-10-CM | POA: Diagnosis not present

## 2019-04-15 DIAGNOSIS — G4733 Obstructive sleep apnea (adult) (pediatric): Secondary | ICD-10-CM | POA: Diagnosis not present

## 2019-04-15 DIAGNOSIS — E669 Obesity, unspecified: Secondary | ICD-10-CM | POA: Diagnosis not present

## 2019-04-15 NOTE — Telephone Encounter (Signed)
-----   Message from Dollene Primrose, RN sent at 04/15/2019  8:24 AM EDT ----- Frederick Baker,  This is from Dr. Radford Pax today.  Thanks!  Lorren  ----- Message ----- From: Sueanne Margarita, MD Sent: 04/15/2019   8:18 AM EDT To: Dollene Primrose, RN  Please forward this to Frederick Baker:  Order CPAP titration in lab to be done with a nasal pillow mask (chin strap may need to be added)  Please call patient's insurance to find out what his out of pocket expense will be with PAP titration and PAP device and call patient to let him know  PRN followup

## 2019-04-22 ENCOUNTER — Other Ambulatory Visit: Payer: Self-pay | Admitting: Cardiovascular Disease

## 2019-04-22 MED ORDER — CLOPIDOGREL BISULFATE 75 MG PO TABS: 75.0000 mg | ORAL_TABLET | Freq: Every day | ORAL | 3 refills | Status: DC

## 2019-04-22 MED ORDER — METOPROLOL TARTRATE 25 MG PO TABS: 25.0000 mg | ORAL_TABLET | Freq: Two times a day (BID) | ORAL | 3 refills | Status: DC

## 2019-04-22 MED ORDER — LOSARTAN POTASSIUM 25 MG PO TABS: ORAL_TABLET | ORAL | 3 refills | Status: DC

## 2019-04-22 NOTE — Telephone Encounter (Signed)
Pt's medications were sent to pt's pharmacy as requested. Confirmation received.  

## 2019-05-15 ENCOUNTER — Telehealth: Payer: Self-pay | Admitting: Family Medicine

## 2019-05-15 NOTE — Telephone Encounter (Signed)
Pt scheduled appt for 8/24 for vertigo. Pt not feeling well and requesting cb for advice from clinical staff. Please advise

## 2019-05-18 ENCOUNTER — Other Ambulatory Visit: Payer: Self-pay

## 2019-05-18 ENCOUNTER — Encounter: Payer: Self-pay | Admitting: Family Medicine

## 2019-05-18 ENCOUNTER — Ambulatory Visit: Payer: BLUE CROSS/BLUE SHIELD | Admitting: Family Medicine

## 2019-05-18 VITALS — BP 132/78 | HR 70 | Temp 98.7°F | Resp 16 | Wt 238.8 lb

## 2019-05-18 DIAGNOSIS — R42 Dizziness and giddiness: Secondary | ICD-10-CM

## 2019-05-18 DIAGNOSIS — Z23 Encounter for immunization: Secondary | ICD-10-CM

## 2019-05-18 DIAGNOSIS — I2510 Atherosclerotic heart disease of native coronary artery without angina pectoris: Secondary | ICD-10-CM

## 2019-05-18 LAB — BASIC METABOLIC PANEL
BUN/Creatinine Ratio: 15 (ref 9–20)
BUN: 16 mg/dL (ref 6–24)
CO2: 21 mmol/L (ref 20–29)
Calcium: 8.9 mg/dL (ref 8.7–10.2)
Chloride: 102 mmol/L (ref 96–106)
Creatinine, Ser: 1.05 mg/dL (ref 0.76–1.27)
GFR calc Af Amer: 89 mL/min/{1.73_m2} (ref >59)
GFR calc non Af Amer: 77 mL/min/{1.73_m2} (ref >59)
Glucose: 116 mg/dL — ABNORMAL HIGH (ref 65–99)
Potassium: 4.2 mmol/L (ref 3.5–5.2)
Sodium: 139 mmol/L (ref 134–144)

## 2019-05-18 LAB — POCT CBC
Granulocyte percent: 56.8 %G (ref 37–80)
HCT, POC: 44 % — AB (ref 29–41)
Hemoglobin: 15.2 g/dL — AB (ref 11–14.6)
Lymph, poc: 2.2 (ref 0.6–3.4)
MCH, POC: 33.9 pg — AB (ref 27–31.2)
MCHC: 34.5 g/dL (ref 31.8–35.4)
MCV: 98.2 fL (ref 76–111)
MID (cbc): 0.4 (ref 0–0.9)
MPV: 7.8 fL (ref 0–99.8)
POC Granulocyte: 3.5 (ref 2–6.9)
POC LYMPH PERCENT: 36.4 %L (ref 10–50)
POC MID %: 6.8 %M (ref 0–12)
Platelet Count, POC: 232 10*3/uL (ref 142–424)
RBC: 4.48 M/uL — AB (ref 4.69–6.13)
RDW, POC: 12.5 %
WBC: 6.1 10*3/uL (ref 4.6–10.2)

## 2019-05-18 LAB — GLUCOSE, POCT (MANUAL RESULT ENTRY): POC Glucose: 103 mg/dl — AB (ref 70–99)

## 2019-05-18 MED ORDER — MECLIZINE HCL 25 MG PO TABS: 12.5000 mg | ORAL_TABLET | Freq: Three times a day (TID) | ORAL | 0 refills | Status: DC | PRN

## 2019-05-18 NOTE — Progress Notes (Signed)
Subjective:    Patient ID: Frederick Baker, male    DOB: 20-Aug-1961, 58 y.o.   MRN: [id number]  HPI Frederick Baker is a 58 y.o. male Presents today for: Chief Complaint  Patient presents with  . Dizziness    woke up friday am 05/15/19 with the room spinning. Still off today but a little. BP ON 05/15/19 -133/68, 05/16/19-158/78, AND 05/17/19 136/83. 1 month ago dx with sleep apnea. 1 week ago rt ear ache post swimming   History of hypertension, hyperlipidemia, CAD, obstructive sleep apnea.  Presents with dizziness as above. I last saw him in 2018.   Dizziness: Started 3 days ago, noted room spinning. Woke up and when rolled over - felt like room was spinning.  Able to go to bathroom.  No associated headache.  No focal weakness, no slurred speech or facial droop.  Symptoms have improved some since that time.  Still notices symptoms if spinning head quick or standing up. Home blood pressures as above ranging from 133/68 up to 158/78.   Right ear soreness 1-2 weeks ago after swimming, using jet ski, but no pain past 10 days - hearing ok now.  Chronic tinnitus, no new sx's - no changes.   No prior vertigo.  Diagnosed with OSA 6/24 - AHI 33.2, desat to 80%. no mask used yet d/t claustrophobia - possible nasal pillow mask.  Has been coordinating with cardiologist.   No chest pain, no palpitations.   At end of visit - discussed covid test - no symptoms, but 27 yo parent in NH - will be opening to visitation, want to make sure he is ok.     Patient Active Problem List   Diagnosis Date Noted  . OSA (obstructive sleep apnea) 04/14/2019  . Obesity (BMI 30-39.9) 04/14/2019  . CAD (coronary artery disease) 12/16/2012  . Hyperlipidemia 12/15/2012  . ST elevation myocardial infarction (STEMI) of inferior wall ([place]) 12/13/2012  . HTN (hypertension) 12/13/2012   Past Medical History:  Diagnosis Date  . CAD (coronary artery disease)    a. 11/2012 Acute Inf STEMI/Cath/PCI: LM nl, LAD nl, LCX 30p,  OM1 30p, RCA 20p, 1m, 95d (3.5x12 promus premier DES), PDA 7m (2.76mm vessel), RPL 100 (2.25x12 promus premier DES), EF 60%;  b. 11/2012 Echo: EF 55%, mild LVH.  Marland Kitchen HTN (hypertension)   . Hyperlipidemia   . Kidney stone   . Obesity (BMI 30-39.9) 04/14/2019  . OSA (obstructive sleep apnea) 04/14/2019   severe OSA with an AHI of 33/hr and severe nocturnal hypoxemia with O2 sats as low as 80%   Past Surgical History:  Procedure Laterality Date  . LEFT HEART CATHETERIZATION WITH CORONARY ANGIOGRAM N/A 12/13/2012   Procedure: LEFT HEART CATHETERIZATION WITH CORONARY ANGIOGRAM;  Surgeon: Burnell Blanks, MD;  Location: [facility] CATH LAB;  Service: Cardiovascular;  Laterality: N/A;  . None    . PERCUTANEOUS CORONARY STENT INTERVENTION (PCI-S) N/A 12/13/2012   Procedure: PERCUTANEOUS CORONARY STENT INTERVENTION (PCI-S);  Surgeon: Burnell Blanks, MD;  Location: [facility] CATH LAB;  Service: Cardiovascular;  Laterality: N/A;   Allergies  Allergen Reactions  . Lisinopril     cough  . Other     Mushrooms - GI Problems  . Statins     Myalgias    Prior to Admission medications   Medication Sig Start Date End Date Taking? Authorizing Provider  aspirin 81 MG tablet Take 81 mg by mouth daily.   Yes [provider]  clopidogrel (PLAVIX) 75 MG  tablet Take 1 tablet (75 mg total) by mouth daily. 04/22/19  Yes Burnell Blanks, MD  Evolocumab (REPATHA SURECLICK) XX123456 MG/ML SOAJ Inject 1 pen into the skin every 14 (fourteen) days. 09/25/18  Yes Burnell Blanks, MD  losartan (COZAAR) 25 MG tablet TAKE 1 TABLET (25 MG TOTAL) BY MOUTH DAILY. 04/22/19  Yes Burnell Blanks, MD  metoprolol tartrate (LOPRESSOR) 25 MG tablet Take 1 tablet (25 mg total) by mouth 2 (two) times daily. 04/22/19  Yes Burnell Blanks, MD  nitroGLYCERIN (NITROSTAT) 0.4 MG SL tablet Place 1 tablet (0.4 mg total) under the tongue every 5 (five) minutes as needed for chest pain. 01/27/19  Yes Imogene Burn,  PA-C   Social History   Socioeconomic History  . Marital status: Single    Spouse name: Not on file  . Number of children: Not on file  . Years of education: Not on file  . Highest education level: Not on file  Occupational History  . Occupation: Works in Equities trader: [employer]  . Financial resource strain: Not on file  . Food insecurity    Worry: Not on file    Inability: Not on file  . Transportation needs    Medical: Not on file    Non-medical: Not on file  Tobacco Use  . Smoking status: Never Smoker  . Smokeless tobacco: Never Used  Substance and Sexual Activity  . Alcohol use: Yes  . Drug use: No  . Sexual activity: Not on file  Lifestyle  . Physical activity    Days per week: Not on file    Minutes per session: Not on file  . Stress: Not on file  Relationships  . Social Herbalist on phone: Not on file    Gets together: Not on file    Attends religious service: Not on file    Active member of club or organization: Not on file    Attends meetings of clubs or organizations: Not on file    Relationship status: Not on file  . Intimate partner violence    Fear of current or ex partner: Not on file    Emotionally abused: Not on file    Physically abused: Not on file    Forced sexual activity: Not on file  Other Topics Concern  . Not on file  Social History Narrative  . Not on file    Review of Systems Per HPI.     Objective:   Physical Exam Vitals signs reviewed.  Constitutional:      Appearance: He is well-developed.  HENT:     Head: Normocephalic and atraumatic.     Right Ear: Tympanic membrane, ear canal and external ear normal.     Left Ear: Tympanic membrane, ear canal and external ear normal.     Nose: Nose normal.  Eyes:     General: No visual field deficit.    Extraocular Movements:     Right eye: Nystagmus (1-2 beats only with sitting up form supine, minimal vertigo at this time only ) present.     Left  eye: Nystagmus present.     Pupils: Pupils are equal, round, and reactive to light.  Neck:     Vascular: No carotid bruit or JVD.  Cardiovascular:     Rate and Rhythm: Normal rate and regular rhythm.     Heart sounds: Normal heart sounds. No murmur.  Pulmonary:  Effort: Pulmonary effort is normal.     Breath sounds: Normal breath sounds. No rales.  Skin:    General: Skin is warm and dry.  Neurological:     Mental Status: He is alert and oriented to person, place, and time.     GCS: GCS eye subscore is 4. GCS verbal subscore is 5. GCS motor subscore is 6.     Cranial Nerves: Cranial nerves are intact. No dysarthria or facial asymmetry.     Sensory: No sensory deficit.     Motor: Motor function is intact. No pronator drift.     Coordination: Coordination is intact. Romberg sign negative.     Gait: Gait is intact.    Vitals:   05/18/19 0811  BP: 132/78  Pulse: 70  Resp: 16  Temp: 98.7 F (37.1 C)  TempSrc: Oral  SpO2: 96%  Weight: 238 lb 12.8 oz (108.3 kg)   Orthostatic VS for the past 24 hrs (Last 3 readings):  BP- Lying Pulse- Lying BP- Standing at 0 minutes Pulse- Standing at 0 minutes BP- Standing at 3 minutes Pulse- Standing at 3 minutes  05/18/19 0817 131/78 68 132/79 68 130/82 76   EKG: Sinus rhythm, no apparent changes from prior reading,  No acute findings. Results for orders placed or performed in visit on 05/18/19  POCT glucose (manual entry)  Result Value Ref Range   POC Glucose 103 (A) 70 - 99 mg/dl  POCT CBC  Result Value Ref Range   WBC 6.1 4.6 - 10.2 K/uL   Lymph, poc 2.2 0.6 - 3.4   POC LYMPH PERCENT 36.4 10 - 50 %L   MID (cbc) 0.4 0 - 0.9   POC MID % 6.8 0 - 12 %M   POC Granulocyte 3.5 2 - 6.9   Granulocyte percent 56.8 37 - 80 %G   RBC 4.48 (A) 4.69 - 6.13 M/uL   Hemoglobin 15.2 (A) 11 - 14.6 g/dL   HCT, POC 44.0 (A) 29 - 41 %   MCV 98.2 76 - 111 fL   MCH, POC 33.9 (A) 27 - 31.2 pg   MCHC 34.5 31.8 - 35.4 g/dL   RDW, POC 12.5 %   Platelet  Count, POC 232 142 - 424 K/uL   MPV 7.8 0 - 99.8 fL        Assessment & Plan:    Frederick Baker is a 58 y.o. male Vertigo - Plan: POCT glucose (manual entry), POCT CBC, Basic metabolic panel, EKG XX123456, meclizine (ANTIVERT) 25 MG tablet, Novel Coronavirus, NAA ([facility]) Dizziness - Plan: POCT glucose (manual entry), POCT CBC, Basic metabolic panel, EKG XX123456, Novel Coronavirus, NAA ([facility])  -Nonfocal neurologic exam, exam an history consistent with peripheral vertigo with improvement in symptoms past few days.  No history of CVD, but is on Plavix and aspirin.  Imaging deferred at present.  -Meclizine prescription given with potential side effects, maintain hydration/symptomatic care, ER/RTC precautions if worsening or not improving next week.  Flu vaccine need - Plan: Flu Vaccine QUAD 36+ mos IM   Coronary artery disease involving native heart without angina pectoris, unspecified vessel or lesion type - Plan: EKG 12-Lead  -Asymptomatic, no apparent changes on EKG.  ER/RTC precautions as above.  At end of visit discussed Covid testing.  He plans to visit his parent who is in a nursing home and decided to have screening done prior to that visit.  In office screen obtained.  Meds ordered this encounter  Medications  .  meclizine (ANTIVERT) 25 MG tablet    Sig: Take 0.5-1 tablets (12.5-25 mg total) by mouth 3 (three) times daily as needed for dizziness.    Dispense:  30 tablet    Refill:  0   Patient Instructions     Based on your history and exam, I suspect you have peripheral vertigo, which is from the middle ear.  Testing in the office was reassuring.  Meclizine was prescribed 1/2 to 1 pill 3 times per day as needed.  That can cause sedation so be careful taking that medication.  Make sure to drink plenty of fluids.  I will check some other electrolytes.  If not continue to improve this week, or any worsening, follow-up to discuss other testing but I do not see a need to do  so at this time. Return to the clinic or go to the nearest emergency room if any of your symptoms worsen or new symptoms occur.  I will check covid test. Results should be available within 1 week.      Vertigo Vertigo is the feeling that you or your surroundings are moving when they are not. This feeling can come and go at any time. Vertigo often goes away on its own. Vertigo can be dangerous if it occurs while you are doing something that could endanger you or others, such as driving or operating machinery. Your health care provider will do tests to determine the cause of your vertigo. Tests will also help your health care provider decide how best to treat your condition. Follow these instructions at home: Eating and drinking      Drink enough fluid to keep your urine pale yellow.  Do not drink alcohol. Activity  Return to your normal activities as told by your health care provider. Ask your health care provider what activities are safe for you.  In the morning, first sit up on the side of the bed. When you feel okay, stand slowly while you hold onto something until you know that your balance is fine.  Move slowly. Avoid sudden body or head movements or certain positions, as told by your health care provider.  If you have trouble walking or keeping your balance, try using a cane for stability. If you feel dizzy or unstable, sit down right away.  Avoid doing any tasks that would cause danger to you or others if vertigo occurs.  Avoid bending down if you feel dizzy. Place items in your home so that they are easy for you to reach without leaning over.  Do not drive or use heavy machinery if you feel dizzy. General instructions  Take over-the-counter and prescription medicines only as told by your health care provider.  Keep all follow-up visits as told by your health care provider. This is important. Contact a health care provider if:  Your medicines do not relieve your  vertigo or they make it worse.  You have a fever.  Your condition gets worse or you develop new symptoms.  Your family or friends notice any behavioral changes.  Your nausea or vomiting gets worse.  You have numbness or a prickling and tingling sensation in part of your body. Get help right away if you:  Have difficulty moving or speaking.  Are always dizzy.  Faint.  Develop severe headaches.  Have weakness in your hands, arms, or legs.  Have changes in your hearing or vision.  Develop a stiff neck.  Develop sensitivity to light. Summary  Vertigo is the feeling that  you or your surroundings are moving when they are not.  Your health care provider will do tests to determine the cause of your vertigo.  Follow instructions for home care. You may be told to avoid certain tasks, positions, or movements.  Contact a health care provider if your medicines do not relieve your symptoms, or if you have a fever, nausea, vomiting, or changes in behavior.  Get help right away if you have severe headaches or difficulty speaking, or you develop hearing or vision problems. This information is not intended to replace advice given to you by your health care provider. Make sure you discuss any questions you have with your health care provider. Document Released: 06/20/2005 Document Revised: 08/04/2018 Document Reviewed: 08/04/2018 Elsevier Patient Education  [employer].   If you have lab work done today you will be contacted with your lab results within the next 2 weeks.  If you have not heard from [place] then please contact us. The fastest way to get your results is to register for My Chart.   IF you received an x-ray today, you will receive an invoice from [facility]. Please contact [facility] at [phone] with questions or concerns regarding your invoice.   IF you received labwork today, you will receive an invoice from [place]. Please contact [facility] at  [phone] with questions or concerns regarding your invoice.   Our billing staff will not be able to assist you with questions regarding bills from these companies.  You will be contacted with the lab results as soon as they are available. The fastest way to get your results is to activate your My Chart account. Instructions are located on the last page of this paperwork. If you have not heard from [place] regarding the results in 2 weeks, please contact this office.       Signed,   Merri Ray, MD [facility].  05/18/19 9:40 AM

## 2019-05-18 NOTE — Patient Instructions (Addendum)
Based on your history and exam, I suspect you have peripheral vertigo, which is from the middle ear.  Testing in the office was reassuring.  Meclizine was prescribed 1/2 to 1 pill 3 times per day as needed.  That can cause sedation so be careful taking that medication.  Make sure to drink plenty of fluids.  I will check some other electrolytes.  If not continue to improve this week, or any worsening, follow-up to discuss other testing but I do not see a need to do so at this time. Return to the clinic or go to the nearest emergency room if any of your symptoms worsen or new symptoms occur.  I will check covid test. Results should be available within 1 week.      Vertigo Vertigo is the feeling that you or your surroundings are moving when they are not. This feeling can come and go at any time. Vertigo often goes away on its own. Vertigo can be dangerous if it occurs while you are doing something that could endanger you or others, such as driving or operating machinery. Your health care provider will do tests to determine the cause of your vertigo. Tests will also help your health care provider decide how best to treat your condition. Follow these instructions at home: Eating and drinking      Drink enough fluid to keep your urine pale yellow.  Do not drink alcohol. Activity  Return to your normal activities as told by your health care provider. Ask your health care provider what activities are safe for you.  In the morning, first sit up on the side of the bed. When you feel okay, stand slowly while you hold onto something until you know that your balance is fine.  Move slowly. Avoid sudden body or head movements or certain positions, as told by your health care provider.  If you have trouble walking or keeping your balance, try using a cane for stability. If you feel dizzy or unstable, sit down right away.  Avoid doing any tasks that would cause danger to you or others if vertigo  occurs.  Avoid bending down if you feel dizzy. Place items in your home so that they are easy for you to reach without leaning over.  Do not drive or use heavy machinery if you feel dizzy. General instructions  Take over-the-counter and prescription medicines only as told by your health care provider.  Keep all follow-up visits as told by your health care provider. This is important. Contact a health care provider if:  Your medicines do not relieve your vertigo or they make it worse.  You have a fever.  Your condition gets worse or you develop new symptoms.  Your family or friends notice any behavioral changes.  Your nausea or vomiting gets worse.  You have numbness or a prickling and tingling sensation in part of your body. Get help right away if you:  Have difficulty moving or speaking.  Are always dizzy.  Faint.  Develop severe headaches.  Have weakness in your hands, arms, or legs.  Have changes in your hearing or vision.  Develop a stiff neck.  Develop sensitivity to light. Summary  Vertigo is the feeling that you or your surroundings are moving when they are not.  Your health care provider will do tests to determine the cause of your vertigo.  Follow instructions for home care. You may be told to avoid certain tasks, positions, or movements.  Contact a health  care provider if your medicines do not relieve your symptoms, or if you have a fever, nausea, vomiting, or changes in behavior.  Get help right away if you have severe headaches or difficulty speaking, or you develop hearing or vision problems. This information is not intended to replace advice given to you by your health care provider. Make sure you discuss any questions you have with your health care provider. Document Released: 06/20/2005 Document Revised: 08/04/2018 Document Reviewed: 08/04/2018 Elsevier Patient Education  [employer].   If you have lab work done today you will be  contacted with your lab results within the next 2 weeks.  If you have not heard from [place] then please contact us. The fastest way to get your results is to register for My Chart.   IF you received an x-ray today, you will receive an invoice from [facility]. Please contact [facility] at [phone] with questions or concerns regarding your invoice.   IF you received labwork today, you will receive an invoice from [facility]. Please contact [facility] at [phone] with questions or concerns regarding your invoice.   Our billing staff will not be able to assist you with questions regarding bills from these companies.  You will be contacted with the lab results as soon as they are available. The fastest way to get your results is to activate your My Chart account. Instructions are located on the last page of this paperwork. If you have not heard from [place] regarding the results in 2 weeks, please contact this office.

## 2019-05-18 NOTE — Telephone Encounter (Signed)
Pt seen in office today for vertigo. Dgaddy, CMA

## 2019-05-19 LAB — NOVEL CORONAVIRUS, NAA: SARS-CoV-2, NAA: NOT DETECTED

## 2019-07-29 ENCOUNTER — Other Ambulatory Visit: Payer: Self-pay

## 2019-07-29 ENCOUNTER — Other Ambulatory Visit: Payer: Managed Care, Other (non HMO) | Admitting: *Deleted

## 2019-07-29 DIAGNOSIS — I10 Essential (primary) hypertension: Secondary | ICD-10-CM

## 2019-07-29 DIAGNOSIS — I2510 Atherosclerotic heart disease of native coronary artery without angina pectoris: Secondary | ICD-10-CM

## 2019-07-29 LAB — CBC
Hematocrit: 45 % (ref 37.5–51.0)
Hemoglobin: 15.2 g/dL (ref 13.0–17.7)
MCH: 33.1 pg — ABNORMAL HIGH (ref 26.6–33.0)
MCHC: 33.8 g/dL (ref 31.5–35.7)
MCV: 98 fL — ABNORMAL HIGH (ref 79–97)
Platelets: 241 10*3/uL (ref 150–450)
RBC: 4.59 x10E6/uL (ref 4.14–5.80)
RDW: 11.5 % — ABNORMAL LOW (ref 11.6–15.4)
WBC: 7.3 10*3/uL (ref 3.4–10.8)

## 2019-07-29 LAB — COMPREHENSIVE METABOLIC PANEL
ALT: 34 IU/L (ref 0–44)
AST: 26 IU/L (ref 0–40)
Albumin/Globulin Ratio: 1.8 (ref 1.2–2.2)
Albumin: 4.7 g/dL (ref 3.8–4.9)
Alkaline Phosphatase: 57 IU/L (ref 39–117)
BUN/Creatinine Ratio: 16 (ref 9–20)
BUN: 18 mg/dL (ref 6–24)
Bilirubin Total: 0.3 mg/dL (ref 0.0–1.2)
CO2: 25 mmol/L (ref 20–29)
Calcium: 9.5 mg/dL (ref 8.7–10.2)
Chloride: 101 mmol/L (ref 96–106)
Creatinine, Ser: 1.12 mg/dL (ref 0.76–1.27)
GFR calc Af Amer: 83 mL/min/{1.73_m2} (ref >59)
GFR calc non Af Amer: 72 mL/min/{1.73_m2} (ref >59)
Globulin, Total: 2.6 g/dL (ref 1.5–4.5)
Glucose: 92 mg/dL (ref 65–99)
Potassium: 4.4 mmol/L (ref 3.5–5.2)
Sodium: 139 mmol/L (ref 134–144)
Total Protein: 7.3 g/dL (ref 6.0–8.5)

## 2019-08-03 ENCOUNTER — Encounter: Payer: Self-pay | Admitting: Cardiovascular Disease

## 2019-08-03 ENCOUNTER — Other Ambulatory Visit: Payer: Self-pay

## 2019-08-03 ENCOUNTER — Telehealth: Payer: Self-pay | Admitting: *Deleted

## 2019-08-03 ENCOUNTER — Ambulatory Visit: Payer: Managed Care, Other (non HMO) | Admitting: Cardiovascular Disease

## 2019-08-03 VITALS — BP 120/82 | HR 63 | Ht 69.0 in | Wt 241.0 lb

## 2019-08-03 DIAGNOSIS — I2510 Atherosclerotic heart disease of native coronary artery without angina pectoris: Secondary | ICD-10-CM

## 2019-08-03 DIAGNOSIS — E785 Hyperlipidemia, unspecified: Secondary | ICD-10-CM | POA: Diagnosis not present

## 2019-08-03 DIAGNOSIS — I10 Essential (primary) hypertension: Secondary | ICD-10-CM | POA: Diagnosis not present

## 2019-08-03 NOTE — Telephone Encounter (Signed)
-----   Message from Burnell Blanks, MD sent at 08/03/2019  4:43 PM EST ----- Tressia Miners, Mr. Hubler saw you back in July by virtual visit to discuss his sleep apnea. He was worried about cost of CPAP and whether or not he could tolerate it. Could you reach out to him or schedule a follow up appt to discuss? Thanks, chris

## 2019-08-03 NOTE — Telephone Encounter (Addendum)
Frederick Margarita, MD  Burnell Blanks, MD; Freada Bergeron, CMA        Sure thing   Gae Bon please set patient up for virtual visit to discuss this - you can work him in Thursday am   Traci      Per TT work pt in Thursday 11/12 in the am. Patient has an appointment for 08/06/19 to discuss his sleep apnea with dr Radford Pax.

## 2019-08-03 NOTE — Patient Instructions (Signed)
Medication Instructions:  Your physician has recommended you make the following change in your medication:  1.) stop aspirin  *If you need a refill on your cardiac medications before your next appointment, please call your pharmacy*  Lab Work: Please return for fasting Lipids --see next page for appointment If you have labs (blood work) drawn today and your tests are completely normal, you will receive your results only by: Marland Kitchen MyChart Message (if you have MyChart) OR . A paper copy in the mail If you have any lab test that is abnormal or we need to change your treatment, we will call you to review the results.  Testing/Procedures: none  Follow-Up: At [facility], you and your health needs are our priority.  As part of our continuing mission to provide you with exceptional heart care, we have created designated Provider Care Teams.  These Care Teams include your primary Cardiologist (physician) and Advanced Practice Providers (APPs -  Physician Assistants and Nurse Practitioners) who all work together to provide you with the care you need, when you need it.  Your next appointment:   12 months  The format for your next appointment:   In Person  Provider:   Lauree Chandler, MD  Other Instructions

## 2019-08-03 NOTE — Progress Notes (Signed)
Chief Complaint  Patient presents with  . Follow-up    CAD    History of Present Illness: 58 yo male with history of CAD, HTN, HLD and sleep apnea who is here today for followup. He was admitted to [facility] 12/13/12 with an inferior STEMI. The RCA was occluded distally. I placed a drug eluting stent in the posterolateral branch and a drug eluting stent in the distal RCA. Echo 12/15/12 with LVEF=55% post inferior MI with inferobasal hypokinesis. He has not tolerated statins in the past despite many attempts in primary care with many different meds and different dosages. He is on Praluent injections. Nuclear stress test in 2016 showed no ischemia. Echo September 2018 with normal LV function, LVEF=55%, no significant valve disease.   He is here today for follow up. The patient denies any chest pain, dyspnea, palpitations, lower extremity edema, orthopnea, PND, dizziness, near syncope or syncope.   Primary Care Physician: Wendie Agreste, MD   Past Medical History:  Diagnosis Date  . CAD (coronary artery disease)    a. 11/2012 Acute Inf STEMI/Cath/PCI: LM nl, LAD nl, LCX 30p, OM1 30p, RCA 20p, 96m, 95d (3.5x12 promus premier DES), PDA 70m (2.28mm vessel), RPL 100 (2.25x12 promus premier DES), EF 60%;  b. 11/2012 Echo: EF 55%, mild LVH.  Marland Kitchen HTN (hypertension)   . Hyperlipidemia   . Kidney stone   . Obesity (BMI 30-39.9) 04/14/2019  . OSA (obstructive sleep apnea) 04/14/2019   severe OSA with an AHI of 33/hr and severe nocturnal hypoxemia with O2 sats as low as 80%    Past Surgical History:  Procedure Laterality Date  . LEFT HEART CATHETERIZATION WITH CORONARY ANGIOGRAM N/A 12/13/2012   Procedure: LEFT HEART CATHETERIZATION WITH CORONARY ANGIOGRAM;  Surgeon: Burnell Blanks, MD;  Location: [facility] CATH LAB;  Service: Cardiovascular;  Laterality: N/A;  . None    . PERCUTANEOUS CORONARY STENT INTERVENTION (PCI-S) N/A 12/13/2012   Procedure: PERCUTANEOUS CORONARY STENT INTERVENTION (PCI-S);   Surgeon: Burnell Blanks, MD;  Location: [facility] CATH LAB;  Service: Cardiovascular;  Laterality: N/A;    Current Outpatient Medications  Medication Sig Dispense Refill  . clopidogrel (PLAVIX) 75 MG tablet Take 1 tablet (75 mg total) by mouth daily. 90 tablet 3  . Evolocumab (REPATHA SURECLICK) [id number] MG/ML SOAJ Inject 1 pen into the skin every 14 (fourteen) days. 2 pen 11  . losartan (COZAAR) 25 MG tablet TAKE 1 TABLET (25 MG TOTAL) BY MOUTH DAILY. 90 tablet 3  . meclizine (ANTIVERT) 25 MG tablet Take 0.5-1 tablets (12.5-25 mg total) by mouth 3 (three) times daily as needed for dizziness. 30 tablet 0  . metoprolol tartrate (LOPRESSOR) 25 MG tablet Take 1 tablet (25 mg total) by mouth 2 (two) times daily. 90 tablet 3  . nitroGLYCERIN (NITROSTAT) 0.4 MG SL tablet Place 1 tablet (0.4 mg total) under the tongue every 5 (five) minutes as needed for chest pain. 25 tablet 6   No current facility-administered medications for this visit.     Allergies  Allergen Reactions  . Lisinopril     cough  . Other     Mushrooms - GI Problems  . Statins     Myalgias     Social History   Socioeconomic History  . Marital status: Single    Spouse name: Not on file  . Number of children: Not on file  . Years of education: Not on file  . Highest education level: Not on file  Occupational History  .  Occupation: Works in Equities trader: [employer]  . Financial resource strain: Not on file  . Food insecurity    Worry: Not on file    Inability: Not on file  . Transportation needs    Medical: Not on file    Non-medical: Not on file  Tobacco Use  . Smoking status: Never Smoker  . Smokeless tobacco: Never Used  Substance and Sexual Activity  . Alcohol use: Yes  . Drug use: No  . Sexual activity: Not on file  Lifestyle  . Physical activity    Days per week: Not on file    Minutes per session: Not on file  . Stress: Not on file  Relationships  . Social Product manager on phone: Not on file    Gets together: Not on file    Attends religious service: Not on file    Active member of club or organization: Not on file    Attends meetings of clubs or organizations: Not on file    Relationship status: Not on file  . Intimate partner violence    Fear of current or ex partner: Not on file    Emotionally abused: Not on file    Physically abused: Not on file    Forced sexual activity: Not on file  Other Topics Concern  . Not on file  Social History Narrative  . Not on file    Family History  Problem Relation Age of Onset  . Stroke Mother   . Hypertension Mother   . Hypertension Father   . CAD Paternal Uncle   . Cancer Brother     Review of Systems:  As stated in the HPI and otherwise negative.   BP 120/82   Pulse 63   Ht 5\' 9"  (1.753 m)   Wt 241 lb (109.3 kg)   SpO2 95%   BMI 35.59 kg/m   Physical Examination:  General: Well developed, well nourished, NAD  HEENT: OP clear, mucus membranes moist  SKIN: warm, dry. No rashes. Neuro: No focal deficits  Musculoskeletal: Muscle strength 5/5 all ext  Psychiatric: Mood and affect normal  Neck: No JVD, no carotid bruits, no thyromegaly, no lymphadenopathy.  Lungs:Clear bilaterally, no wheezes, rhonci, crackles Cardiovascular: Regular rate and rhythm. No murmurs, gallops or rubs. Abdomen:Soft. Bowel sounds present. Non-tender.  Extremities: No lower extremity edema. Pulses are 2 + in the bilateral DP/PT.  Cardiac cath 12/13/12: Left main: No obstructive disease.  Left Anterior Descending Artery: Large caliber vessel that courses to the apex. Mild diffuse plaque in the proximal, mid and distal segments.  Circumflex Artery: Moderate caliber vessel with moderate caliber first obtuse marginal branch. The proximal Circumflex and the proximal segment of the OM branch have 30% stenosis. Small to moderate caliber intermediate branch.  Right Coronary Artery: Large, dominant vessel with diffuse 20%  stenosis proximal vessel, 40% stenosis mid vessel, 95% hazy stenosis distal vessel. The PDA is small in caliber (2.0 mm) and has a 95% mid stenosis. The posterolateral branch is moderate in caliber and has a 100% total occlusion.  Left Ventricular Angiogram: LVEF 60%  EKG:  EKG is not ordered today. The ekg ordered today demonstrates   Recent Labs: 07/29/2019: ALT 34; BUN 18; Creatinine, Ser 1.12; Hemoglobin 15.2; Platelets 241; Potassium 4.4; Sodium 139   Lipid Panel    Component Value Date/Time   CHOL 141 09/11/2018 0801   TRIG 183 (H) 09/11/2018 0801  HDL 37 (L) 09/11/2018 0801   CHOLHDL 3.8 09/11/2018 0801   CHOLHDL 2.9 03/29/2016 0752   VLDL 27 03/29/2016 0752   LDLCALC 67 09/11/2018 0801     Wt Readings from Last 3 Encounters:  08/03/19 241 lb (109.3 kg)  05/18/19 238 lb 12.8 oz (108.3 kg)  01/27/19 232 lb (105.2 kg)     Other studies Reviewed: Additional studies/ records that were reviewed today include: . Review of the above records demonstrates:    Assessment and Plan:   1. CAD without angina: He has no chest pain. Echo in September 2018 with normal LV systolic function. Continue  Plavix, beta blocker and Praluent. He is intolerant of statins.  Will stop ASA.   2. HTN: BP controlled. No changes  3. HLD: LDL at goal in December 2019. Will repeat lipids now.  Continue Praluent.   4. Sleep apnea: He has had a sleep study and had a visit with Dr. Radford Pax in July 2020 He has not yet started CPAP due to the cost and fear of claustrophobia. I will route this to Dr. Radford Pax so she can discuss his options with him.   Current medicines are reviewed at length with the patient today.  The patient does not have concerns regarding medicines.  The following changes have been made:  no change  Labs/ tests ordered today include:   Orders Placed This Encounter  Procedures  . Lipid Profile    Disposition:   FU with me in 12  months  Signed, Lauree Chandler, MD  08/03/2019 4:41 PM    [facility] [place], [place], [place]  [address] Phone: [phone]; Fax: [phone]

## 2019-08-04 ENCOUNTER — Telehealth: Payer: Self-pay | Admitting: *Deleted

## 2019-08-04 NOTE — Telephone Encounter (Signed)
I spoke with pt about virtual appointment with Dr Radford Pax scheduled for 08/06/19.  Pt will not be able to do virtual appointment that day.  I rescheduled appointment for 08/24/19 at 8:00.  This will be a video appointment.

## 2019-08-06 ENCOUNTER — Telehealth: Payer: Managed Care, Other (non HMO) | Admitting: Cardiology

## 2019-08-13 ENCOUNTER — Other Ambulatory Visit: Payer: Managed Care, Other (non HMO) | Admitting: *Deleted

## 2019-08-13 ENCOUNTER — Other Ambulatory Visit: Payer: Self-pay

## 2019-08-13 DIAGNOSIS — E785 Hyperlipidemia, unspecified: Secondary | ICD-10-CM

## 2019-08-13 LAB — LIPID PANEL
Chol/HDL Ratio: 3.4 ratio (ref 0.0–5.0)
Cholesterol, Total: 141 mg/dL (ref 100–199)
HDL: 42 mg/dL (ref >39)
LDL Chol Calc (NIH): 72 mg/dL (ref 0–99)
Triglycerides: 154 mg/dL — ABNORMAL HIGH (ref 0–149)
VLDL Cholesterol Cal: 27 mg/dL (ref 5–40)

## 2019-08-17 ENCOUNTER — Telehealth: Payer: Self-pay | Admitting: Pharmacist

## 2019-08-17 NOTE — Telephone Encounter (Signed)
Called patient and let him know that we have renewed his repatha copay card. His works is switching to [employer] stating Dec 1. He does not have ID card yet. Advised patient to call us with info when he receives it so we can submit a new prior authorization. Provided him with clinic number.

## 2019-08-23 NOTE — Progress Notes (Signed)
Virtual Visit via Video Note   This visit type was conducted due to national recommendations for restrictions regarding the COVID-19 Pandemic (e.g. social distancing) in an effort to limit this patient's exposure and mitigate transmission in our community.  Due to his co-morbid illnesses, this patient is at least at moderate risk for complications without adequate follow up.  This format is felt to be most appropriate for this patient at this time.  All issues noted in this document were discussed and addressed.  A limited physical exam was performed with this format.  Please refer to the patient's chart for his consent to telehealth for [facility].   Evaluation Performed:  Follow-up visit  This visit type was conducted due to national recommendations for restrictions regarding the COVID-19 Pandemic (e.g. social distancing).  This format is felt to be most appropriate for this patient at this time.  All issues noted in this document were discussed and addressed.  No physical exam was performed (except for noted visual exam findings with Video Visits).  Please refer to the patient's chart (MyChart message for video visits and phone note for telephone visits) for the patient's consent to telehealth for [facility].  Date:  08/26/2019   ID:  Frederick Baker, DOB Jan 15, 1961, MRN [id number]  Patient Location:  Home  Provider location:   [place]  PCP:  Wendie Agreste, MD  Cardiologist:  Lauree Chandler, MD  Sleep Medicine:  Fransico Him, MD Electrophysiologist:  None   Chief Complaint:  OSA  History of Present Illness:    Frederick Baker is a 58 y.o. male who presents via audio/video conferencing for a telehealth visit today.    This is a 58yo male with a hx of HTN and CAD who was referred for sleep study due to snoring and HTN.  He was found to severe OSA with an AHI of 33/hr and severe nocturnal hypoxemia with O2 sats as low as 80%.  CPAP titration was recommended.    We had a televisit in July to discuss the sleep study results and was concerned about insurance paying for his device and that he would not tolerate the mask.  Unfortunately this was never followed up on with my office and the patient in regards to the cost.  He tells me now that he really does not think he will be able to tolerate a PAP mask and wants to discuss other options.  He has read about the [facility] device but is not sure he wants to go through with it but would like to learn more about it.   The patient does not have symptoms concerning for COVID-19 infection (fever, chills, cough, or new shortness of breath).    Prior CV studies:   The following studies were reviewed today: none  Past Medical History:  Diagnosis Date  . CAD (coronary artery disease)    a. 11/2012 Acute Inf STEMI/Cath/PCI: LM nl, LAD nl, LCX 30p, OM1 30p, RCA 20p, 71m, 95d (3.5x12 promus premier DES), PDA 62m (2.44mm vessel), RPL 100 (2.25x12 promus premier DES), EF 60%;  b. 11/2012 Echo: EF 55%, mild LVH.  Marland Kitchen HTN (hypertension)   . Hyperlipidemia   . Kidney stone   . Obesity (BMI 30-39.9) 04/14/2019  . OSA (obstructive sleep apnea) 04/14/2019   severe OSA with an AHI of 33/hr and severe nocturnal hypoxemia with O2 sats as low as 80%   Past Surgical History:  Procedure Laterality Date  . LEFT HEART CATHETERIZATION WITH CORONARY ANGIOGRAM  N/A 12/13/2012   Procedure: LEFT HEART CATHETERIZATION WITH CORONARY ANGIOGRAM;  Surgeon: Burnell Blanks, MD;  Location: [facility] CATH LAB;  Service: Cardiovascular;  Laterality: N/A;  . None    . PERCUTANEOUS CORONARY STENT INTERVENTION (PCI-S) N/A 12/13/2012   Procedure: PERCUTANEOUS CORONARY STENT INTERVENTION (PCI-S);  Surgeon: Burnell Blanks, MD;  Location: [facility] CATH LAB;  Service: Cardiovascular;  Laterality: N/A;     Current Meds  Medication Sig  . clopidogrel (PLAVIX) 75 MG tablet Take 1 tablet (75 mg total) by mouth daily.  Marland Kitchen losartan (COZAAR) 25 MG tablet TAKE 1  TABLET (25 MG TOTAL) BY MOUTH DAILY.  . meclizine (ANTIVERT) 25 MG tablet Take 0.5-1 tablets (12.5-25 mg total) by mouth 3 (three) times daily as needed for dizziness.  . metoprolol tartrate (LOPRESSOR) 25 MG tablet Take 1 tablet (25 mg total) by mouth 2 (two) times daily.  . nitroGLYCERIN (NITROSTAT) 0.4 MG SL tablet Place 1 tablet (0.4 mg total) under the tongue every 5 (five) minutes as needed for chest pain.  . [DISCONTINUED] Evolocumab (REPATHA SURECLICK) [id number] MG/ML SOAJ Inject 1 pen into the skin every 14 (fourteen) days.     Allergies:   Lisinopril, Other, and Statins   Social History   Tobacco Use  . Smoking status: Never Smoker  . Smokeless tobacco: Never Used  Substance Use Topics  . Alcohol use: Yes  . Drug use: No     Family Hx: The patient's family history includes CAD in his paternal uncle; Cancer in his brother; Hypertension in his father and mother; Stroke in his mother.  ROS:   Please see the history of present illness.     All other systems reviewed and are negative.   Labs/Other Tests and Data Reviewed:    Recent Labs: 07/29/2019: ALT 34; BUN 18; Creatinine, Ser 1.12; Hemoglobin 15.2; Platelets 241; Potassium 4.4; Sodium 139   Recent Lipid Panel Lab Results  Component Value Date/Time   CHOL 141 08/13/2019 08:09 AM   TRIG 154 (H) 08/13/2019 08:09 AM   HDL 42 08/13/2019 08:09 AM   CHOLHDL 3.4 08/13/2019 08:09 AM   CHOLHDL 2.9 03/29/2016 07:52 AM   LDLCALC 72 08/13/2019 08:09 AM    Wt Readings from Last 3 Encounters:  08/24/19 234 lb (106.1 kg)  08/03/19 241 lb (109.3 kg)  05/18/19 238 lb 12.8 oz (108.3 kg)     Objective:    Vital Signs:  BP 120/80   Pulse 60   Ht 5\' 9"  (1.753 m)   Wt 234 lb (106.1 kg)   BMI 34.56 kg/m    CONSTITUTIONAL:  Well nourished, well developed male in no acute distress.  EYES: anicteric MOUTH: oral mucosa is pink RESPIRATORY: Normal respiratory effort, symmetric expansion CARDIOVASCULAR: No peripheral edema SKIN:  No rash, lesions or ulcers MUSCULOSKELETAL: no digital cyanosis NEURO: Cranial Nerves II-XII grossly intact, moves all extremities PSYCH: Intact judgement and insight.  A&O x 3, Mood/affect appropriate   ASSESSMENT & PLAN:    1.  OSA -he has still not started on CPAP because he was concerned about cost and also thinks that he would not be able to tolerate anything on his face and is not interested in trying it -we discussed an oral device but it would be close to $3K out of pocket which he is not interested in and I do not think it would be a good option given the severity of his OSA.  -I discussed the hypoglossal nerve stimulator procedure with him and will  refer him to Dr. Redmond Baseman or Dr. Wilburn Cornelia for further evaluation.   2.  HTN -BP controlled on exam -continue on Losartan 25mg  daily and Lopressor 25mg  BID  3.  Obesity - I have encouraged him to get into a routine exercise program and cut back on carbs and portions.   COVID-19 Education: The signs and symptoms of COVID-19 were discussed with the patient and how to seek care for testing (follow up with PCP or arrange E-visit).  The importance of social distancing was discussed today.  Patient Risk:   After full review of this patient's clinical status, I feel that they are at least moderate risk at this time.  Time:   Today, I have spent 20 minutes directly with the patient on telemedicine discussing medical problems including OSA< HTN, obesity.  We also reviewed the symptoms of COVID 19 and the ways to protect against contracting the virus with telehealth technology.   Medication Adjustments/Labs and Tests Ordered: Current medicines are reviewed at length with the patient today.  Concerns regarding medicines are outlined above.  Tests Ordered: No orders of the defined types were placed in this encounter.  Medication Changes: No orders of the defined types were placed in this encounter.   Disposition:  Follow up in 3 month(s)   Signed, Fransico Him, MD  08/26/2019 9:39 AM    [facility]

## 2019-08-24 ENCOUNTER — Other Ambulatory Visit: Payer: Self-pay

## 2019-08-24 ENCOUNTER — Telehealth (INDEPENDENT_AMBULATORY_CARE_PROVIDER_SITE_OTHER): Payer: Managed Care, Other (non HMO) | Admitting: Cardiology

## 2019-08-24 ENCOUNTER — Telehealth: Payer: Managed Care, Other (non HMO) | Admitting: Cardiology

## 2019-08-24 VITALS — BP 120/80 | HR 60 | Ht 69.0 in | Wt 234.0 lb

## 2019-08-24 DIAGNOSIS — I10 Essential (primary) hypertension: Secondary | ICD-10-CM

## 2019-08-24 DIAGNOSIS — E669 Obesity, unspecified: Secondary | ICD-10-CM

## 2019-08-24 DIAGNOSIS — G4733 Obstructive sleep apnea (adult) (pediatric): Secondary | ICD-10-CM

## 2019-08-24 MED ORDER — REPATHA SURECLICK 140 MG/ML ~~LOC~~ SOAJ: 1.0000 "pen " | SUBCUTANEOUS | 11 refills | Status: DC

## 2019-08-25 ENCOUNTER — Telehealth: Payer: Self-pay | Admitting: *Deleted

## 2019-08-25 NOTE — Telephone Encounter (Signed)
-----   Message from Sueanne Margarita, MD sent at 08/24/2019  8:20 AM EST ----- Please refer patient to Dr. Redmond Baseman or Wilburn Cornelia to be evaluated for [facility] device

## 2019-08-25 NOTE — Telephone Encounter (Signed)
Referral put in by Romie Minus at checkout to Dr Wilburn Cornelia.

## 2019-09-01 ENCOUNTER — Other Ambulatory Visit: Payer: Self-pay

## 2019-09-01 MED ORDER — REPATHA SURECLICK 140 MG/ML ~~LOC~~ SOAJ: 1.0000 "pen " | SUBCUTANEOUS | 11 refills | Status: DC

## 2019-09-02 ENCOUNTER — Telehealth: Payer: Self-pay | Admitting: Cardiology

## 2019-09-02 NOTE — Telephone Encounter (Signed)
[facility] was calling to check on the Prior Auth status for this patient's Repatha. Please contact the pharmacy to let them know the status of the authorizaton

## 2019-09-02 NOTE — Telephone Encounter (Signed)
Pt changed insurances, new PA has been submitted.

## 2019-09-03 NOTE — Telephone Encounter (Addendum)
Repatha prior auth approved through 08/30/20. Provided pharmacy with this info for processing.

## 2019-09-08 DIAGNOSIS — I10 Essential (primary) hypertension: Secondary | ICD-10-CM | POA: Diagnosis not present

## 2019-09-08 DIAGNOSIS — K148 Other diseases of tongue: Secondary | ICD-10-CM | POA: Diagnosis not present

## 2019-09-08 DIAGNOSIS — K1379 Other lesions of oral mucosa: Secondary | ICD-10-CM | POA: Diagnosis not present

## 2019-09-08 DIAGNOSIS — G4733 Obstructive sleep apnea (adult) (pediatric): Secondary | ICD-10-CM | POA: Diagnosis not present

## 2019-09-11 ENCOUNTER — Other Ambulatory Visit: Payer: Self-pay | Admitting: Pharmacist

## 2019-09-11 MED ORDER — REPATHA SURECLICK 140 MG/ML ~~LOC~~ SOAJ: 1.0000 "pen " | SUBCUTANEOUS | 11 refills | Status: DC

## 2019-10-06 ENCOUNTER — Telehealth: Payer: Self-pay | Admitting: *Deleted

## 2019-10-06 NOTE — Telephone Encounter (Signed)
[facility] to [facility].  DME selection is [facility]. Patient understands he will be contacted by [facility] to set up his cpap. Patient understands to call if [facility] does not contact him with new setup in a timely manner. Patient understands they will be called once confirmation has been received from [facility] that they have received their new machine to schedule 10 week follow up appointment.  [facility] notified of new cpap order  Please add to airview Patient was grateful for the call and thanked me.

## 2019-10-06 NOTE — Telephone Encounter (Signed)
-----   Message from Sueanne Margarita, MD sent at 10/06/2019  1:48 PM EST ----- Patient saw ENT because he did not want the cost of the PAP.  They recommended CPAP. Please order ResMed auto CPAP from 4 to 20cm H2O with heated humidity and mask of choice with a donwload in 4 weeks and followup with me in 10 weeks  Traci

## 2019-10-14 NOTE — Telephone Encounter (Signed)
DME has changed to [facility] because patient is out of network.

## 2019-10-16 ENCOUNTER — Other Ambulatory Visit: Payer: Self-pay | Admitting: Cardiovascular Disease

## 2019-10-16 MED ORDER — METOPROLOL TARTRATE 25 MG PO TABS: 25.0000 mg | ORAL_TABLET | Freq: Two times a day (BID) | ORAL | 3 refills | Status: DC

## 2019-10-16 NOTE — Telephone Encounter (Signed)
Pt's medications were sent to pt's pharmacy as requested. Confirmation received.  

## 2019-10-19 ENCOUNTER — Other Ambulatory Visit: Payer: Self-pay | Admitting: Cardiovascular Disease

## 2019-12-21 DIAGNOSIS — D225 Melanocytic nevi of trunk: Secondary | ICD-10-CM | POA: Diagnosis not present

## 2019-12-21 DIAGNOSIS — L821 Other seborrheic keratosis: Secondary | ICD-10-CM | POA: Diagnosis not present

## 2019-12-21 DIAGNOSIS — L820 Inflamed seborrheic keratosis: Secondary | ICD-10-CM | POA: Diagnosis not present

## 2019-12-21 DIAGNOSIS — L218 Other seborrheic dermatitis: Secondary | ICD-10-CM | POA: Diagnosis not present

## 2019-12-21 DIAGNOSIS — L814 Other melanin hyperpigmentation: Secondary | ICD-10-CM | POA: Diagnosis not present

## 2019-12-21 DIAGNOSIS — D485 Neoplasm of uncertain behavior of skin: Secondary | ICD-10-CM | POA: Diagnosis not present

## 2019-12-21 DIAGNOSIS — L430 Hypertrophic lichen planus: Secondary | ICD-10-CM | POA: Diagnosis not present

## 2020-01-01 NOTE — Telephone Encounter (Signed)
Since October 21 2019 Frederick Baker can not reach patient and he has not returned the calls. Frederick Baker has voided the order until patient calls back to re-instate the order.

## 2020-06-04 DIAGNOSIS — M25511 Pain in right shoulder: Secondary | ICD-10-CM | POA: Diagnosis not present

## 2020-06-22 DIAGNOSIS — M25511 Pain in right shoulder: Secondary | ICD-10-CM | POA: Diagnosis not present

## 2020-06-28 ENCOUNTER — Other Ambulatory Visit: Payer: Self-pay | Admitting: Orthopedic Surgery

## 2020-06-28 DIAGNOSIS — R52 Pain, unspecified: Secondary | ICD-10-CM

## 2020-06-28 DIAGNOSIS — M25619 Stiffness of unspecified shoulder, not elsewhere classified: Secondary | ICD-10-CM

## 2020-06-28 DIAGNOSIS — M25119 Fistula, unspecified shoulder: Secondary | ICD-10-CM

## 2020-07-18 ENCOUNTER — Ambulatory Visit
Admission: RE | Admit: 2020-07-18 | Discharge: 2020-07-18 | Disposition: A | Discharge status: Home / Self Care | Payer: BC Managed Care – PPO | Source: Ambulatory Visit | Attending: Orthopedic Surgery | Admitting: Orthopedic Surgery

## 2020-07-18 DIAGNOSIS — R52 Pain, unspecified: Secondary | ICD-10-CM

## 2020-07-18 DIAGNOSIS — M25119 Fistula, unspecified shoulder: Secondary | ICD-10-CM

## 2020-07-18 DIAGNOSIS — M25511 Pain in right shoulder: Secondary | ICD-10-CM | POA: Diagnosis not present

## 2020-07-18 DIAGNOSIS — M25619 Stiffness of unspecified shoulder, not elsewhere classified: Secondary | ICD-10-CM

## 2020-07-22 ENCOUNTER — Telehealth: Payer: Self-pay | Admitting: *Deleted

## 2020-07-22 DIAGNOSIS — M25511 Pain in right shoulder: Secondary | ICD-10-CM | POA: Diagnosis not present

## 2020-07-22 NOTE — Telephone Encounter (Signed)
° °  Primary Cardiologist: Lauree Chandler, MD  Chart reviewed as part of pre-operative protocol coverage. Because of Frederick Baker past medical history and time since last visit, he will require a follow-up visit in order to better assess preoperative cardiovascular risk.  Pre-op covering staff: - Please schedule appointment and call patient to inform them. He has an appointment with Ermalinda Barrios 08/24/20, though patient may want a sooner visit if shoulder is particularly bothersome. If patient already had an upcoming appointment within acceptable timeframe, please add "pre-op clearance" to the appointment notes so provider is aware. - Please contact requesting surgeon's office via preferred method (i.e, phone, fax) to inform them of need for appointment prior to surgery.  Will route to Dr. Angelena Form for input on holding plavix in anticipation of surgery.   Abigail Butts, PA-C  07/22/2020, 5:37 PM

## 2020-07-22 NOTE — Telephone Encounter (Signed)
    [facility] Pre-operative Risk Assessment    HEARTCARE STAFF: - Please ensure there is not already an duplicate clearance open for this procedure. - Under Visit Info/Reason for Call, type in Other and utilize the format Clearance MM/DD/YY or Clearance TBD. Do not use dashes or single digits. - If request is for dental extraction, please clarify the # of teeth to be extracted.  Request for surgical clearance:  1. What type of surgery is being performed? RIGHT SHOULDER SCOPE, ROTATOR CUFF REPAIR   2. When is this surgery scheduled? TBD ( SOMETIME IN AROUND THE END OF November)  3. What type of clearance is required (medical clearance vs. Pharmacy clearance to hold med vs. Both)? MEDICAL   4. Are there any medications that need to be held prior to surgery and how long? PLAVIX   5. Practice name and name of physician performing surgery? [facility]; DR. DAX VARKEY   6. What is the office phone number? [phone]   7.   What is the office fax number? Baker.   Anesthesia type (None, local, MAC, general) ? CHOICE   Julaine Hua 07/22/2020, 12:23 PM  _________________________________________________________________   (provider comments below)

## 2020-07-22 NOTE — Telephone Encounter (Signed)
OK to hold Plavix for his surgical procedure.   Frederick Baker

## 2020-07-26 ENCOUNTER — Telehealth: Payer: Self-pay | Admitting: Cardiovascular Disease

## 2020-07-26 NOTE — Telephone Encounter (Deleted)
   [facility] Pre-operative Risk Assessment    HEARTCARE STAFF: - Please ensure there is not already an duplicate clearance open for this procedure. - Under Visit Info/Reason for Call, type in Other and utilize the format Clearance MM/DD/YY or Clearance TBD. Do not use dashes or single digits. - If request is for dental extraction, please clarify the # of teeth to be extracted.  Request for surgical clearance:  1. What type of surgery is being performed? Right Knee scope irrigation and debridement  2. When is this surgery scheduled? TBD  3. What type of clearance is required (medical clearance vs. Pharmacy clearance to hold med vs. Both)? Both  4. Are there any medications that need to be held prior to surgery and how long? Plavix   5. Practice name and name of physician performing surgery? [facility], Dr. Sydnee Cabal  6. What is the office phone number? [phone]   7.   What is the office fax number? [phone]  8.   Anesthesia type (None, local, MAC, general) ? Femoral nerve block with IV sedation    Jacqulynn Cadet 07/26/2020, 3:17 PM  _________________________________________________________________   (provider comments below)

## 2020-07-26 NOTE — Telephone Encounter (Signed)
Follow Up:     Pt said he dropped off papers for his surgical clearance. He would like to know the staus of his clearance please.

## 2020-07-26 NOTE — Telephone Encounter (Signed)
Patient has upcoming cardiac clearance visit on 08/24/2020. Will remove from pool.   Callback pool to inform requesting provider and check with them to see if 12/1 visit for preop is ok or whether the procedure is urgent.

## 2020-07-26 NOTE — Telephone Encounter (Signed)
Frederick Baker is pending cardiology follow up before final clearance can be given

## 2020-07-26 NOTE — Telephone Encounter (Signed)
Called and spoke with Sherri at [facility] and informed her that the patient is scheduled for a pre op clearance appointment for 08/24/20 with a cardiology PA and clearance will be determined at that appointment if further testing is not required.

## 2020-07-28 NOTE — Progress Notes (Signed)
Cardiology Office Note   Date:  07/29/2020   ID:  Frederick Baker, DOB 17-Feb-1961, MRN [id number]  PCP:  Wendie Agreste, MD  Cardiologist:  [facility]     Chief Complaint  Patient presents with  . Pre-op Exam      History of Present Illness: Frederick Baker is a 59 y.o. male who presents for pre-op for Rt shoulder scope and rotator cuff repair.   Per Dr. Angelena Form ok to hold plavix.   He has hx of HTN and CAD who was referred for sleep studydue to snoring and HTN. He was found to severe OSA with an AHI of 33/hr and severe nocturnal hypoxemia with O2 sats as low as 80%. CPAP titration was recommended.   He has ahistory of CAD, HTN, HLD and sleep apnea who is here today for followup. He was admitted to [facility] 12/13/12 with an inferior STEMI. The RCA was occluded distally. I placed a drug eluting stent in the posterolateral branch and a drug eluting stent in the distal RCA. Echo 12/15/12 with LVEF=55% post inferior MI with inferobasal hypokinesis. He has not tolerated statins in the past despite many attempts in primary care with many different meds and different dosages. He is on Praluent injections. Nuclear stress test in 2016 showed no ischemia. Echo September 2018 with normal LV function, LVEF=55%, no significant valve disease.   Today no chest pain no SOB, though he must pace himself at times.  But this is not new has been ongoing.  Walks up 2 flights of steps without issue and does yard work with weed eating, very physical.  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  He had questions about his sleep apnea as well. Needs follow up with sleep.  Will wait until after surgery.  He will tell his pre-op ant anesthesia he has been dx with sleep apnea.      Past Medical History:  Diagnosis Date  . CAD (coronary artery disease)    a. 11/2012 Acute Inf STEMI/Cath/PCI: LM nl, LAD nl, LCX 30p, OM1 30p, RCA 20p, 73m, 95d (3.5x12 promus premier DES), PDA 22m (2.81mm vessel), RPL 100 (2.25x12 promus premier DES),  EF 60%;  b. 11/2012 Echo: EF 55%, mild LVH.  Marland Kitchen HTN (hypertension)   . Hyperlipidemia   . Kidney stone   . Obesity (BMI 30-39.9) 04/14/2019  . OSA (obstructive sleep apnea) 04/14/2019   severe OSA with an AHI of 33/hr and severe nocturnal hypoxemia with O2 sats as low as 80%    Past Surgical History:  Procedure Laterality Date  . LEFT HEART CATHETERIZATION WITH CORONARY ANGIOGRAM N/A 12/13/2012   Procedure: LEFT HEART CATHETERIZATION WITH CORONARY ANGIOGRAM;  Surgeon: Burnell Blanks, MD;  Location: [facility] CATH LAB;  Service: Cardiovascular;  Laterality: N/A;  . None    . PERCUTANEOUS CORONARY STENT INTERVENTION (PCI-S) N/A 12/13/2012   Procedure: PERCUTANEOUS CORONARY STENT INTERVENTION (PCI-S);  Surgeon: Burnell Blanks, MD;  Location: [facility] CATH LAB;  Service: Cardiovascular;  Laterality: N/A;     Current Outpatient Medications  Medication Sig Dispense Refill  . clopidogrel (PLAVIX) 75 MG tablet TAKE 1 TABLET BY MOUTH DAILY GENERIC EQUIVALENT FOR PLAVIX 90 tablet 3  . Evolocumab (REPATHA SURECLICK) 884 MG/ML SOAJ Inject 1 pen into the skin every 14 (fourteen) days. 2 pen 11  . losartan (COZAAR) 25 MG tablet TAKE 1 TABLET BY MOUTH DAILY 90 tablet 3  . meclizine (ANTIVERT) 25 MG tablet Take 0.5-1 tablets (12.5-25 mg total) by mouth 3 (three)  times daily as needed for dizziness. 30 tablet 0  . metoprolol tartrate (LOPRESSOR) 25 MG tablet TAKE 1 TABLET BY MOUTH TWICE DAILY 180 tablet 3  . nitroGLYCERIN (NITROSTAT) 0.4 MG SL tablet Place 1 tablet (0.4 mg total) under the tongue every 5 (five) minutes as needed for chest pain. 25 tablet 6   No current facility-administered medications for this visit.    Allergies:   Lisinopril, Other, and Statins    Social History:  The patient  reports that he has never smoked. He has never used smokeless tobacco. He reports current alcohol use. He reports that he does not use drugs.   Family History:  The patient's family history includes CAD in  his paternal uncle; Cancer in his brother; Hypertension in his father and mother; Stroke in his mother.    ROS:  General:no colds or fevers, continued wt loss Skin:no rashes or ulcers HEENT:no blurred vision, no congestion CV:see HPI PUL:see HPI GI:no diarrhea constipation or melena, no indigestion GU:no hematuria, no dysuria MS:no joint pain, no claudication Neuro:no syncope, no lightheadedness Endo:no diabetes, no thyroid disease  Wt Readings from Last 3 Encounters:  07/29/20 229 lb (103.9 kg)  08/24/19 234 lb (106.1 kg)  08/03/19 241 lb (109.3 kg)     PHYSICAL EXAM: VS:  BP 110/70   Pulse 62   Ht 5\' 9"  (1.753 m)   Wt 229 lb (103.9 kg)   SpO2 96%   BMI 33.82 kg/m  , BMI Body mass index is 33.82 kg/m. General:Pleasant affect, NAD Skin:Warm and dry, brisk capillary refill HEENT:normocephalic, sclera clear, mucus membranes moist Neck:supple, no JVD, no bruits  Heart:S1S2 RRR without murmur, gallup, rub or click Lungs:clear without rales, rhonchi, or wheezes EQA:STMH, non tender, + BS, do not palpate liver spleen or masses Ext:no lower ext edema, 2+ pedal pulses, 2+ radial pulses Neuro:alert and oriented X 3, MAE, follows commands, + facial symmetry    EKG:  EKG is ordered today. The ekg ordered today demonstrates SR at 62 and no ST changes from prior stable EKG.    Recent Labs: No results found for requested labs within last 8760 hours.    Lipid Panel    Component Value Date/Time   CHOL 141 08/13/2019 0809   TRIG 154 (H) 08/13/2019 0809   HDL 42 08/13/2019 0809   CHOLHDL 3.4 08/13/2019 0809   CHOLHDL 2.9 03/29/2016 0752   VLDL 27 03/29/2016 0752   LDLCALC 72 08/13/2019 0809       Other studies Reviewed: Additional studies/ records that were reviewed today include: . Echo 06/06/17 Study Conclusions   - Left ventricle: The cavity size was normal. Systolic function was  normal. The estimated ejection fraction was in the range of 55%  to 60%. Wall  motion was normal; there were no regional wall  motion abnormalities. Left ventricular diastolic function  parameters were normal.  - Aortic valve: There was trivial regurgitation.  - Left atrium: The atrium was mildly dilated.  - Atrial septum: No defect or patent foramen ovale was identified.   ASSESSMENT AND PLAN:  1.  Pre-op eval pt with hx of MI in 2014 with stents to RCA, PLA and dRCA.  He has done well since.  He meets 4 METS of activity and is acceptable risk for surgery - plans to do soon.  Per Dr. Angelena Form may hold plavix for 5 days prior to surgery and resume post op when safe.   Per Dr. Ophelia Charter    2.  CAD without angina normal EF in 2018 and no edema no rales  3.  HLD on repatha will check CMP and lipids today intolerant to statins  4.  HTN controlled on losartan continue and BB.continue  5.  Sleep apnea not yet started CPAP   He is afraid of claustrophobia.  He was seen by Dr. Wilburn Cornelia for possible inspire implant but cost is an issue - plan was for trial of CPAP.    Pt has been vaccinated and was to have booster today for COVID, he was thinking of shingles vaccine as well.     Current medicines are reviewed with the patient today.  The patient Has no concerns regarding medicines.  The following changes have been made:  See above Labs/ tests ordered today include:see above  Disposition:   FU:  see above  Signed, [name], NP  07/29/2020 12:02 PM    [address], [place], [place], [place] Phone: [phone]; Fax: [phone]

## 2020-07-29 ENCOUNTER — Ambulatory Visit: Payer: BC Managed Care – PPO | Admitting: Cardiology

## 2020-07-29 ENCOUNTER — Encounter: Payer: Self-pay | Admitting: Cardiology

## 2020-07-29 ENCOUNTER — Other Ambulatory Visit: Payer: Self-pay

## 2020-07-29 VITALS — BP 110/70 | HR 62 | Ht 69.0 in | Wt 229.0 lb

## 2020-07-29 DIAGNOSIS — G4733 Obstructive sleep apnea (adult) (pediatric): Secondary | ICD-10-CM

## 2020-07-29 DIAGNOSIS — Z01818 Encounter for other preprocedural examination: Secondary | ICD-10-CM

## 2020-07-29 DIAGNOSIS — E785 Hyperlipidemia, unspecified: Secondary | ICD-10-CM

## 2020-07-29 DIAGNOSIS — I10 Essential (primary) hypertension: Secondary | ICD-10-CM | POA: Diagnosis not present

## 2020-07-29 DIAGNOSIS — I2510 Atherosclerotic heart disease of native coronary artery without angina pectoris: Secondary | ICD-10-CM | POA: Diagnosis not present

## 2020-07-29 NOTE — Patient Instructions (Signed)
Medication Instructions:  Your physician recommends that you continue on your current medications as directed. Please refer to the Current Medication list given to you today.  *If you need a refill on your cardiac medications before your next appointment, please call your pharmacy*   Lab Work: TODAY:  LIPID & CMET  If you have labs (blood work) drawn today and your tests are completely normal, you will receive your results only by: Marland Kitchen MyChart Message (if you have MyChart) OR . A paper copy in the mail If you have any lab test that is abnormal or we need to change your treatment, we will call you to review the results.   Testing/Procedures: None ordered   Follow-Up: At [facility], you and your health needs are our priority.  As part of our continuing mission to provide you with exceptional heart care, we have created designated Provider Care Teams.  These Care Teams include your primary Cardiologist (physician) and Advanced Practice Providers (APPs -  Physician Assistants and Nurse Practitioners) who all work together to provide you with the care you need, when you need it.  We recommend signing up for the patient portal called "MyChart".  Sign up information is provided on this After Visit Summary.  MyChart is used to connect with patients for Virtual Visits (Telemedicine).  Patients are able to view lab/test results, encounter notes, upcoming appointments, etc.  Non-urgent messages can be sent to your provider as well.   To learn more about what you can do with MyChart, go to [email or web address].    Your next appointment:   12 month(s)  The format for your next appointment:   In Person  Provider:   Lauree Chandler, MD   Other Instructions After your surgery, call the Dr for sleep and get an appointment.

## 2020-07-30 LAB — COMPREHENSIVE METABOLIC PANEL
ALT: 36 IU/L (ref 0–44)
AST: 24 IU/L (ref 0–40)
Albumin/Globulin Ratio: 1.6 (ref 1.2–2.2)
Albumin: 4.5 g/dL (ref 3.8–4.9)
Alkaline Phosphatase: 60 IU/L (ref 44–121)
BUN/Creatinine Ratio: 16 (ref 10–24)
BUN: 15 mg/dL (ref 8–27)
Bilirubin Total: 0.7 mg/dL (ref 0.0–1.2)
CO2: 26 mmol/L (ref 20–29)
Calcium: 8.9 mg/dL (ref 8.6–10.2)
Chloride: 102 mmol/L (ref 96–106)
Creatinine, Ser: 0.96 mg/dL (ref 0.76–1.27)
GFR calc Af Amer: 99 mL/min/{1.73_m2} (ref >59)
GFR calc non Af Amer: 86 mL/min/{1.73_m2} (ref >59)
Globulin, Total: 2.8 g/dL (ref 1.5–4.5)
Glucose: 84 mg/dL (ref 65–99)
Potassium: 4.4 mmol/L (ref 3.5–5.2)
Sodium: 142 mmol/L (ref 134–144)
Total Protein: 7.3 g/dL (ref 6.0–8.5)

## 2020-07-30 LAB — LIPID PANEL
Chol/HDL Ratio: 3.1 ratio (ref 0.0–5.0)
Cholesterol, Total: 125 mg/dL (ref 100–199)
HDL: 40 mg/dL (ref >39)
LDL Chol Calc (NIH): 57 mg/dL (ref 0–99)
Triglycerides: 169 mg/dL — ABNORMAL HIGH (ref 0–149)
VLDL Cholesterol Cal: 28 mg/dL (ref 5–40)

## 2020-08-01 ENCOUNTER — Telehealth: Payer: Self-pay

## 2020-08-02 ENCOUNTER — Telehealth: Payer: Self-pay | Admitting: Family Medicine

## 2020-08-02 NOTE — Telephone Encounter (Signed)
Surgical clearance paperwork to completed by provider came via fax today 08/02/2020 from [place] paperwork in provider/CMA mail box at [facility] has surgical clearance appt scheduled for 12'/10/2019

## 2020-08-02 NOTE — Telephone Encounter (Signed)
Clearance request received from [place].  Faxed over [name], NP's office note.

## 2020-08-03 NOTE — Telephone Encounter (Signed)
Forms received and still in provider box. Provider will complete forms once she has her appointment.

## 2020-08-11 DIAGNOSIS — H524 Presbyopia: Secondary | ICD-10-CM | POA: Diagnosis not present

## 2020-08-11 DIAGNOSIS — H35033 Hypertensive retinopathy, bilateral: Secondary | ICD-10-CM | POA: Diagnosis not present

## 2020-08-11 DIAGNOSIS — H25013 Cortical age-related cataract, bilateral: Secondary | ICD-10-CM | POA: Diagnosis not present

## 2020-08-11 DIAGNOSIS — H04123 Dry eye syndrome of bilateral lacrimal glands: Secondary | ICD-10-CM | POA: Diagnosis not present

## 2020-08-11 DIAGNOSIS — H2513 Age-related nuclear cataract, bilateral: Secondary | ICD-10-CM | POA: Diagnosis not present

## 2020-08-15 ENCOUNTER — Ambulatory Visit: Payer: Managed Care, Other (non HMO) | Admitting: Physician Assistant

## 2020-08-24 ENCOUNTER — Ambulatory Visit: Payer: Managed Care, Other (non HMO) | Admitting: Physician Assistant

## 2020-08-25 ENCOUNTER — Encounter: Payer: Self-pay | Admitting: Family Medicine

## 2020-08-25 ENCOUNTER — Other Ambulatory Visit: Payer: Self-pay

## 2020-08-25 ENCOUNTER — Ambulatory Visit (INDEPENDENT_AMBULATORY_CARE_PROVIDER_SITE_OTHER): Payer: BC Managed Care – PPO

## 2020-08-25 ENCOUNTER — Ambulatory Visit: Payer: BC Managed Care – PPO | Admitting: Family Medicine

## 2020-08-25 VITALS — BP 125/75 | HR 63 | Temp 98.1°F | Ht 69.0 in | Wt 225.4 lb

## 2020-08-25 DIAGNOSIS — Z01818 Encounter for other preprocedural examination: Secondary | ICD-10-CM

## 2020-08-25 DIAGNOSIS — I10 Essential (primary) hypertension: Secondary | ICD-10-CM | POA: Diagnosis not present

## 2020-08-25 DIAGNOSIS — G4733 Obstructive sleep apnea (adult) (pediatric): Secondary | ICD-10-CM

## 2020-08-25 DIAGNOSIS — I2510 Atherosclerotic heart disease of native coronary artery without angina pectoris: Secondary | ICD-10-CM

## 2020-08-25 NOTE — Patient Instructions (Addendum)
I do recommend treatment of your sleep apnea -  previous testing did indicate severe sleep apnea. Let me know if I can refer you to other sleep specialist, but recommend you call Dr. Theodosia Blender office about device options.  I will put information on your paperwork for surgery about the sleep apnea.  Depending on your other labs and x-ray results can complete your paperwork within the next week to 10 days for orthopedics.  At this time I do not see any obvious contraindications to your upcoming surgery.  Thank you for coming in today and please let me know if there are questions.  Take care.       If you have lab work done today you will be contacted with your lab results within the next 2 weeks.  If you have not heard from [place] then please contact us. The fastest way to get your results is to register for My Chart.   IF you received an x-ray today, you will receive an invoice from [facility]. Please contact [facility] at [phone] with questions or concerns regarding your invoice.   IF you received labwork today, you will receive an invoice from [facility]. Please contact [facility] at [phone] with questions or concerns regarding your invoice.   Our billing staff will not be able to assist you with questions regarding bills from these companies.  You will be contacted with the lab results as soon as they are available. The fastest way to get your results is to activate your My Chart account. Instructions are located on the last page of this paperwork. If you have not heard from [place] regarding the results in 2 weeks, please contact this office.

## 2020-08-25 NOTE — Progress Notes (Signed)
Subjective:  Patient ID: Frederick Baker, male    DOB: 09-28-1960  Age: 59 y.o. MRN: [id number]  CC:  Chief Complaint  Patient presents with  . Pre-op Exam    rotator right shoulder, has recent ekg, labs pending  . Immunizations    will geting shingle shot interfere with upcoming surgery. Will have done at pharmacy    HPI Frederick Baker presents for   Preop exam for rotator cuff of his right shoulder.  Dr. Griffin Basil.  History of CAD, hypertension, hyperlipidemia, sleep apnea.  Inferior STEMI in 2014.  Status post drug-eluting stent. Cardiac preop exam November 5, Frederick Kicks, NP with [facility] heart care, note reviewed.  Meets 4 METS of activity and acceptable risk for surgery, plan for holding Plavix for 5 days prior to surgery and resume postop when safe.   With history of OSA, had not yet started CPAP.  Met with ENT, Inspire not an option.  Has not used CPAP - concerned about being claustrophobic and use of device.  Sleep study in June 2020. AHI 33.2, no central apneas. O2 nadir 80%.   Last general anesthesia in 2014. No difficulty with anesthesia known.   Lost weight intentionally with diet changes.  Wt Readings from Last 3 Encounters:  08/25/20 225 lb 6.4 oz (102.2 kg)  07/29/20 229 lb (103.9 kg)  08/24/19 234 lb (106.1 kg)     Results for orders placed or performed in visit on 07/29/20  Lipid panel  Result Value Ref Range   Cholesterol, Total 125 100 - 199 mg/dL   Triglycerides 169 (H) 0 - 149 mg/dL   HDL 40 >39 mg/dL   VLDL Cholesterol Cal 28 5 - 40 mg/dL   LDL Chol Calc (NIH) 57 0 - 99 mg/dL   Chol/HDL Ratio 3.1 0.0 - 5.0 ratio  Comp Met (CMET)  Result Value Ref Range   Glucose 84 65 - 99 mg/dL   BUN 15 8 - 27 mg/dL   Creatinine, Ser 0.96 0.76 - 1.27 mg/dL   GFR calc non Af Amer 86 >59 mL/min/1.73   GFR calc Af Amer 99 >59 mL/min/1.73   BUN/Creatinine Ratio 16 10 - 24   Sodium 142 134 - 144 mmol/L   Potassium 4.4 3.5 - 5.2 mmol/L   Chloride 102 96 - 106  mmol/L   CO2 26 20 - 29 mmol/L   Calcium 8.9 8.6 - 10.2 mg/dL   Total Protein 7.3 6.0 - 8.5 g/dL   Albumin 4.5 3.8 - 4.9 g/dL   Globulin, Total 2.8 1.5 - 4.5 g/dL   Albumin/Globulin Ratio 1.6 1.2 - 2.2   Bilirubin Total 0.7 0.0 - 1.2 mg/dL   Alkaline Phosphatase 60 44 - 121 IU/L   AST 24 0 - 40 IU/L   ALT 36 0 - 44 IU/L    History Patient Active Problem List   Diagnosis Date Noted  . OSA (obstructive sleep apnea) 04/14/2019  . Obesity (BMI 30-39.9) 04/14/2019  . CAD (coronary artery disease) 12/16/2012  . Hyperlipidemia 12/15/2012  . ST elevation myocardial infarction (STEMI) of inferior wall ([place]) 12/13/2012  . HTN (hypertension) 12/13/2012   Past Medical History:  Diagnosis Date  . CAD (coronary artery disease)    a. 11/2012 Acute Inf STEMI/Cath/PCI: LM nl, LAD nl, LCX 30p, OM1 30p, RCA 20p, 69m 95d (3.5x12 promus premier DES), PDA 987m2.60m26messel), RPL 100 (2.25x12 promus premier DES), EF 60%;  b. 11/2012 Echo: EF 55%, mild LVH.  . HMarland KitchenN (hypertension)   .  Hyperlipidemia   . Kidney stone   . Obesity (BMI 30-39.9) 04/14/2019  . OSA (obstructive sleep apnea) 04/14/2019   severe OSA with an AHI of 33/hr and severe nocturnal hypoxemia with O2 sats as low as 80%   Past Surgical History:  Procedure Laterality Date  . LEFT HEART CATHETERIZATION WITH CORONARY ANGIOGRAM N/A 12/13/2012   Procedure: LEFT HEART CATHETERIZATION WITH CORONARY ANGIOGRAM;  Surgeon: Burnell Blanks, MD;  Location: [facility] CATH LAB;  Service: Cardiovascular;  Laterality: N/A;  . None    . PERCUTANEOUS CORONARY STENT INTERVENTION (PCI-S) N/A 12/13/2012   Procedure: PERCUTANEOUS CORONARY STENT INTERVENTION (PCI-S);  Surgeon: Burnell Blanks, MD;  Location: [facility] CATH LAB;  Service: Cardiovascular;  Laterality: N/A;   Allergies  Allergen Reactions  . Lisinopril     cough  . Other     Mushrooms - GI Problems  . Statins     Myalgias    Prior to Admission medications   Medication Sig Start Date End  Date Taking? Authorizing Provider  clopidogrel (PLAVIX) 75 MG tablet TAKE 1 TABLET BY MOUTH DAILY GENERIC EQUIVALENT FOR PLAVIX 10/16/19  Yes Burnell Blanks, MD  Evolocumab (REPATHA SURECLICK) 263 MG/ML SOAJ Inject 1 pen into the skin every 14 (fourteen) days. 09/11/19  Yes Burnell Blanks, MD  losartan (COZAAR) 25 MG tablet TAKE 1 TABLET BY MOUTH DAILY 10/16/19  Yes Burnell Blanks, MD  metoprolol tartrate (LOPRESSOR) 25 MG tablet TAKE 1 TABLET BY MOUTH TWICE DAILY 10/20/19  Yes Burnell Blanks, MD  nitroGLYCERIN (NITROSTAT) 0.4 MG SL tablet Place 1 tablet (0.4 mg total) under the tongue every 5 (five) minutes as needed for chest pain. 01/27/19  Yes Imogene Burn, PA-C   Social History   Socioeconomic History  . Marital status: Single    Spouse name: Not on file  . Number of children: Not on file  . Years of education: Not on file  . Highest education level: Not on file  Occupational History  . Occupation: Works in Equities trader: [employer]  Tobacco Use  . Smoking status: Never Smoker  . Smokeless tobacco: Never Used  Vaping Use  . Vaping Use: Never used  Substance and Sexual Activity  . Alcohol use: Yes  . Drug use: No  . Sexual activity: Not on file  Other Topics Concern  . Not on file  Social History Narrative  . Not on file   Social Determinants of Health   Financial Resource Strain:   . Difficulty of Paying Living Expenses: Not on file  Food Insecurity:   . Worried About Charity fundraiser in the Last Year: Not on file  . Ran Out of Food in the Last Year: Not on file  Transportation Needs:   . Lack of Transportation (Medical): Not on file  . Lack of Transportation (Non-Medical): Not on file  Physical Activity:   . Days of Exercise per Week: Not on file  . Minutes of Exercise per Session: Not on file  Stress:   . Feeling of Stress : Not on file  Social Connections:   . Frequency of Communication with Friends and Family:  Not on file  . Frequency of Social Gatherings with Friends and Family: Not on file  . Attends Religious Services: Not on file  . Active Member of Clubs or Organizations: Not on file  . Attends Archivist Meetings: Not on file  . Marital Status: Not on file  Intimate  Partner Violence:   . Fear of Current or Ex-Partner: Not on file  . Emotionally Abused: Not on file  . Physically Abused: Not on file  . Sexually Abused: Not on file    Review of Systems  Constitutional: Negative for fatigue and unexpected weight change.  Eyes: Negative for visual disturbance.  Respiratory: Negative for cough, chest tightness and shortness of breath.   Cardiovascular: Negative for chest pain, palpitations and leg swelling.  Gastrointestinal: Negative for abdominal pain and blood in stool.  Neurological: Negative for dizziness, light-headedness and headaches.    Objective:   Vitals:   08/25/20 1615  BP: 125/75  Pulse: 63  Temp: 98.1 F (36.7 C)  SpO2: 96%  Weight: 225 lb 6.4 oz (102.2 kg)  Height: '5\' 9"'  (1.753 m)     Physical Exam Vitals reviewed.  Constitutional:      Appearance: He is well-developed.  HENT:     Head: Normocephalic and atraumatic.  Eyes:     Pupils: Pupils are equal, round, and reactive to light.  Neck:     Vascular: No carotid bruit or JVD.  Cardiovascular:     Rate and Rhythm: Normal rate and regular rhythm.     Heart sounds: Normal heart sounds. No murmur heard.   Pulmonary:     Effort: Pulmonary effort is normal.     Breath sounds: Normal breath sounds. No rales.  Skin:    General: Skin is warm and dry.  Neurological:     Mental Status: He is alert and oriented to person, place, and time.    34 minutes spent during visit, greater than 50% counseling and assimilation of information, chart review, and discussion of plan.   Assessment & Plan:  DAIMION ADAMCIK is a 59 y.o. male . Preoperative evaluation to rule out surgical contraindication - Plan:  CBC with Differential/Platelet, DG Chest 2 View, Protime-INR  OSA (obstructive sleep apnea)  Hypertension, unspecified type  Coronary artery disease involving native heart without angina pectoris, unspecified vessel or lesion type  Has completed cardiac preoperative evaluation, recommendations per cardiology In regards to medical clearance, will check CBC, chest x-ray as above, CMP reviewed from cardiology visit without apparent concerns.  Untreated sleep apnea discussed and recommended treatment or discussion with his previous sleep specialist, option of another sleep study/specialist if needed.  We will need to advise anesthesiologist/surgeon regarding sleep apnea in preparation for his surgery and recovery. Form for surgeon depending on above.   No orders of the defined types were placed in this encounter.  Patient Instructions   I do recommend treatment of your sleep apnea -  previous testing did indicate severe sleep apnea. Let me know if I can refer you to other sleep specialist, but recommend you call Dr. Theodosia Blender office about device options.  I will put information on your paperwork for surgery about the sleep apnea.  Depending on your other labs and x-ray results can complete your paperwork within the next week to 10 days for orthopedics.  At this time I do not see any obvious contraindications to your upcoming surgery.  Thank you for coming in today and please let me know if there are questions.  Take care.       If you have lab work done today you will be contacted with your lab results within the next 2 weeks.  If you have not heard from [place] then please contact us. The fastest way to get your results is to register for My Chart.  IF you received an x-ray today, you will receive an invoice from [facility]. Please contact [facility] at [phone] with questions or concerns regarding your invoice.   IF you received labwork today, you will receive an invoice  from [facility]. Please contact [facility] at [phone] with questions or concerns regarding your invoice.   Our billing staff will not be able to assist you with questions regarding bills from these companies.  You will be contacted with the lab results as soon as they are available. The fastest way to get your results is to activate your My Chart account. Instructions are located on the last page of this paperwork. If you have not heard from [place] regarding the results in 2 weeks, please contact this office.          Signed, Merri Ray, MD [facility]

## 2020-08-26 LAB — PROTIME-INR
INR: 1.1 (ref 0.9–1.2)
Prothrombin Time: 11 s (ref 9.1–12.0)

## 2020-08-26 LAB — CBC WITH DIFFERENTIAL/PLATELET
Basophils Absolute: 0.1 10*3/uL (ref 0.0–0.2)
Basos: 1 %
EOS (ABSOLUTE): 0.1 10*3/uL (ref 0.0–0.4)
Eos: 1 %
Hematocrit: 44.4 % (ref 37.5–51.0)
Hemoglobin: 15.2 g/dL (ref 13.0–17.7)
Immature Grans (Abs): 0 10*3/uL (ref 0.0–0.1)
Immature Granulocytes: 0 %
Lymphocytes Absolute: 2.5 10*3/uL (ref 0.7–3.1)
Lymphs: 35 %
MCH: 33.6 pg — ABNORMAL HIGH (ref 26.6–33.0)
MCHC: 34.2 g/dL (ref 31.5–35.7)
MCV: 98 fL — ABNORMAL HIGH (ref 79–97)
Monocytes Absolute: 0.7 10*3/uL (ref 0.1–0.9)
Monocytes: 9 %
Neutrophils Absolute: 3.8 10*3/uL (ref 1.4–7.0)
Neutrophils: 54 %
Platelets: 244 10*3/uL (ref 150–450)
RBC: 4.52 x10E6/uL (ref 4.14–5.80)
RDW: 11.8 % (ref 11.6–15.4)
WBC: 7.2 10*3/uL (ref 3.4–10.8)

## 2020-09-12 ENCOUNTER — Other Ambulatory Visit: Payer: Self-pay

## 2020-09-12 MED ORDER — LOSARTAN POTASSIUM 25 MG PO TABS
ORAL_TABLET | ORAL | 3 refills | Status: DC
Start: 2020-09-12 — End: 2021-04-20

## 2020-09-12 MED ORDER — CLOPIDOGREL BISULFATE 75 MG PO TABS: ORAL_TABLET | ORAL | 3 refills | Status: DC

## 2020-09-24 ENCOUNTER — Other Ambulatory Visit: Payer: Self-pay | Admitting: Cardiovascular Disease

## 2020-09-28 NOTE — Telephone Encounter (Signed)
Upon patient request DME selection is [facility]. Patient understands he will be contacted by [facility] to set up his cpap. Patient understands to call if [facility] does not contact him with new setup in a timely manner. Patient understands they will be called once confirmation has been received from [facility] that they have received their new machine to schedule 10 week follow up appointment.  [facility] notified of new cpap order  Please add to airview Patient was grateful for the call and thanked me.  [facility] will check with patients insurance to see if he can proceed with getting a cpap or if he needs to repeat a HST.

## 2020-10-05 ENCOUNTER — Other Ambulatory Visit: Payer: Self-pay

## 2020-10-05 ENCOUNTER — Encounter (HOSPITAL_BASED_OUTPATIENT_CLINIC_OR_DEPARTMENT_OTHER): Payer: Self-pay | Admitting: Orthopaedic Surgery

## 2020-10-08 ENCOUNTER — Other Ambulatory Visit (HOSPITAL_COMMUNITY)
Admission: RE | Admit: 2020-10-08 | Discharge: 2020-10-08 | Disposition: A | Discharge status: Home / Self Care | Payer: BC Managed Care – PPO | Source: Ambulatory Visit | Attending: Orthopaedic Surgery | Admitting: Orthopaedic Surgery

## 2020-10-08 DIAGNOSIS — Z20822 Contact with and (suspected) exposure to covid-19: Secondary | ICD-10-CM | POA: Diagnosis not present

## 2020-10-08 DIAGNOSIS — Z01812 Encounter for preprocedural laboratory examination: Secondary | ICD-10-CM | POA: Insufficient documentation

## 2020-10-08 LAB — SARS CORONAVIRUS 2 (TAT 6-24 HRS): SARS Coronavirus 2: NEGATIVE

## 2020-10-11 ENCOUNTER — Encounter (HOSPITAL_BASED_OUTPATIENT_CLINIC_OR_DEPARTMENT_OTHER): Payer: Self-pay | Admitting: Orthopaedic Surgery

## 2020-10-11 ENCOUNTER — Encounter (HOSPITAL_BASED_OUTPATIENT_CLINIC_OR_DEPARTMENT_OTHER)
Admission: RE | Admit: 2020-10-11 | Discharge: 2020-10-11 | Disposition: A | Discharge status: Home / Self Care | Payer: BC Managed Care – PPO | Source: Ambulatory Visit | Attending: Orthopaedic Surgery | Admitting: Orthopaedic Surgery

## 2020-10-11 NOTE — Anesthesia Preprocedure Evaluation (Addendum)
Anesthesia Evaluation  Patient identified by MRN, date of birth, ID band Patient awake    Reviewed: Allergy & Precautions, NPO status , Patient's Chart, lab work & pertinent test results, reviewed documented beta blocker date and time   Airway Mallampati: III  TM Distance: >3 FB Neck ROM: Full    Dental  (+) Teeth Intact   Pulmonary sleep apnea (severe nocturnal hypoxemia (80%)) ,  Non compliant with CPAP, can't get a mask to fit and he is claustrophobic   Pulmonary exam normal breath sounds clear to auscultation       Cardiovascular Exercise Tolerance: Good hypertension, Pt. on medications and Pt. on home beta blockers + CAD, + Past MI and + Cardiac Stents (x2, 2014)  Normal cardiovascular exam Rhythm:Regular Rate:Normal  Inferior MI 3/14 PCI with stents x2  RCA and PDA  Echo 06/06/17 Left ventricle: The cavity size was normal. Systolic function was normal. The estimated ejection fraction was in the range of 55%to 60%. Wall motion was normal; there were no regional wall motion abnormalities. Left ventricular diastolic function parameters were normal.  - Aortic valve: There was trivial regurgitation.  - Left atrium: The atrium was mildly dilated.  - Atrial septum: No defect or patent foramen ovale was identified.   EKG 07/29/20 NSR, no acute changes  Stress test 04/15/15 There was no ST segment deviation noted during stress.   Neuro/Psych negative neurological ROS  negative psych ROS   GI/Hepatic negative GI ROS, Neg liver ROS,   Endo/Other  Obesity BMI 34  Renal/GU Renal diseaseHx/o renal calculi  negative genitourinary   Musculoskeletal  (+) Arthritis , Osteoarthritis,    Abdominal (+) + obese,   Peds  Hematology negative hematology ROS (+)   Anesthesia Other Findings   Reproductive/Obstetrics negative OB ROS                         Anesthesia Physical Anesthesia Plan  ASA:  III  Anesthesia Plan: General   Post-op Pain Management:  Regional for Post-op pain   Induction: Intravenous  PONV Risk Score and Plan: 3 and Treatment may vary due to age or medical condition and Ondansetron  Airway Management Planned: Oral ETT  Additional Equipment:   Intra-op Plan:   Post-operative Plan: Extubation in OR  Informed Consent: I have reviewed the patients History and Physical, chart, labs and discussed the procedure including the risks, benefits and alternatives for the proposed anesthesia with the patient or authorized representative who has indicated his/her understanding and acceptance.     Dental advisory given  Plan Discussed with: CRNA  Anesthesia Plan Comments: (Discussed with patient staying overnight in [place] due to untreated OSA and other medical history. Patient is agreeable. Will discuss with Dr. Griffin Basil on day of surgery.)      Anesthesia Quick Evaluation

## 2020-10-11 NOTE — H&P (Signed)
PREOPERATIVE H&P  Chief Complaint: RIGHT SHOULDER PRIMARY OSTEOARTHRITIS IMPINGEMENT STRAIN OF MUSCLE AND TENDON OF ROTAOR CUFF  HPI: Frederick Baker is a 60 y.o. male who is scheduled for, Procedure(s): SHOULDER ARTHROSCOPY WITH SUBACROMIAL DECOMPRESSION  PARTIAL ACROMIOPLASTYAND DISTAL CLAVICLE EXCISION SHOULDER ARTHROSCOPY WITH ROTATOR CUFF REPAIR AND OPEN BICEPS TENODESIS.   Patient has a past medical history significant for Coronary artery disease with stents placed 6 years ago, on Plavix, OSA, HTN, hyperlipidemia.   This is a 60 year old who tripped and fell and went to our urgent care in September 2021. Regarding his right shoulder, he had immediate pain in his shoulder.  No previous issues with the shoulder.  The patient is still having quite a bit of trouble with raising his arm.  He feels very stiff.  He feels weak.  His sleep has been altered.    His ymptoms are rated as moderate to severe, and have been worsening.  This is significantly impairing activities of daily living.    Please see clinic note for further details on this patient's care.    He has elected for surgical management.   Past Medical History:  Diagnosis Date  . CAD (coronary artery disease)    a. 11/2012 Acute Inf STEMI/Cath/PCI: LM nl, LAD nl, LCX 30p, OM1 30p, RCA 20p, 72m, 95d (3.5x12 promus premier DES), PDA 78m (2.80mm vessel), RPL 100 (2.25x12 promus premier DES), EF 60%;  b. 11/2012 Echo: EF 55%, mild LVH.  Marland Kitchen History of kidney stones   . HTN (hypertension)   . Hyperlipidemia   . Kidney stone   . Obesity (BMI 30-39.9) 04/14/2019  . OSA (obstructive sleep apnea) 04/14/2019   severe OSA with an AHI of 33/hr and severe nocturnal hypoxemia with O2 sats as low as 80%  . OSA (obstructive sleep apnea)    does not use CPAP  . Osteoarthritis of right shoulder    Past Surgical History:  Procedure Laterality Date  . CARDIAC CATHETERIZATION    . COLONOSCOPY    . LEFT HEART CATHETERIZATION WITH CORONARY  ANGIOGRAM N/A 12/13/2012   Procedure: LEFT HEART CATHETERIZATION WITH CORONARY ANGIOGRAM;  Surgeon: Burnell Blanks, MD;  Location: [facility] CATH LAB;  Service: Cardiovascular;  Laterality: N/A;  . MUSCLE BIOPSY    . None    . PERCUTANEOUS CORONARY STENT INTERVENTION (PCI-S) N/A 12/13/2012   Procedure: PERCUTANEOUS CORONARY STENT INTERVENTION (PCI-S);  Surgeon: Burnell Blanks, MD;  Location[facility] CATH LAB;  Service: Cardiovascular;  Laterality: N/A;   Social History   Socioeconomic History  . Marital status: Single    Spouse name: Not on file  . Number of children: Not on file  . Years of education: Not on file  . Highest education level: Not on file  Occupational History  . Occupation: Works in Equities trader: [employer]  Tobacco Use  . Smoking status: Never Smoker  . Smokeless tobacco: Never Used  Vaping Use  . Vaping Use: Never used  Substance and Sexual Activity  . Alcohol use: Yes    Comment: social  . Drug use: No  . Sexual activity: Not on file  Other Topics Concern  . Not on file  Social History Narrative  . Not on file   Social Determinants of Health   Financial Resource Strain: Not on file  Food Insecurity: Not on file  Transportation Needs: Not on file  Physical Activity: Not on file  Stress: Not on file  Social Connections: Not on  file   Family History  Problem Relation Age of Onset  . Stroke Mother   . Hypertension Mother   . Hypertension Father   . CAD Paternal Uncle   . Cancer Brother    Allergies  Allergen Reactions  . Lisinopril     cough  . Other     Mushrooms - GI Problems  . Statins     Myalgias    Prior to Admission medications   Medication Sig Start Date End Date Taking? Authorizing Provider  clopidogrel (PLAVIX) 75 MG tablet TAKE 1 TABLET BY MOUTH DAILY GENERIC EQUIVALENT FOR PLAVIX 09/12/20  Yes Burnell Blanks, MD  losartan (COZAAR) 25 MG tablet TAKE 1 TABLET BY MOUTH DAILY 09/12/20  Yes Burnell Blanks, MD  metoprolol tartrate (LOPRESSOR) 25 MG tablet TAKE 1 TABLET BY MOUTH TWICE DAILY 10/20/19  Yes Burnell Blanks, MD  Multiple Vitamin (MULTIVITAMIN WITH MINERALS) TABS tablet Take 1 tablet by mouth daily.   Yes [provider]  REPATHA SURECLICK 161 MG/ML SOAJ INJECT 1 PEN INTO THE SKIN EVERY 14 DAYS 09/26/20  Yes Burnell Blanks, MD  Saccharomyces boulardii (PROBIOTIC) 250 MG CAPS Take by mouth.   Yes [provider]  nitroGLYCERIN (NITROSTAT) 0.4 MG SL tablet Place 1 tablet (0.4 mg total) under the tongue every 5 (five) minutes as needed for chest pain. 01/27/19   Imogene Burn, PA-C    ROS: All other systems have been reviewed and were otherwise negative with the exception of those mentioned in the HPI and as above.  Physical Exam: General: Alert, no acute distress Cardiovascular: No pedal edema Respiratory: No cyanosis, no use of accessory musculature GI: No organomegaly, abdomen is soft and non-tender Skin: No lesions in the area of chief complaint Neurologic: Sensation intact distally Psychiatric: Patient is competent for consent with normal mood and affect Lymphatic: No axillary or cervical lymphadenopathy  MUSCULOSKELETAL:  Examination of the right shoulder demonstrates active forward elevation to about 80 degrees, passive to 110.  External rotation to about 30 compared to 60.  Cuff strength is 4/5 supraspinatus, infraspinatus and subscapularis.  Positive impingement.  Negative AC tenderness to palpation.  Negative O'Brien's test.    Imaging: MRI reviewed demonstrating a full thickness supraspinatus and infraspinatus tear with a partial thickness subscapularis tear, proximal biceps avulsion and some thickening consistent with adhesive capsulitis and bursitis.    Assessment: Right shoulder three tendon cuff tear with biceps avulsion, as well as adhesive capsulitis findings.    Plan: Plan for Procedure(s): SHOULDER ARTHROSCOPY WITH  SUBACROMIAL DECOMPRESSION  PARTIAL ACROMIOPLASTYAND DISTAL CLAVICLE EXCISION SHOULDER ARTHROSCOPY WITH ROTATOR CUFF REPAIR AND OPEN BICEPS TENODESIS  The risks benefits and alternatives were discussed with the patient including but not limited to the risks of nonoperative treatment, versus surgical intervention including infection, bleeding, nerve injury,  blood clots, cardiopulmonary complications, morbidity, mortality, among others, and they were willing to proceed.   The patient acknowledged the explanation, agreed to proceed with the plan and consent was signed.   He received clearance from PCP, Dr. Merri Ray, and cardiologist, Dr. Angelena Form. Hold plavix 5 days prior to surgery  Operative Plan: Right shoulder scope, SAD, DCE, RCR Discharge Medications: Tylenol, Celebrex Oxycodone, Zofran DVT Prophylaxis: None Physical Therapy: Outpatient PT Special Discharge needs: Grady, PA-C  10/11/2020 4:22 PM

## 2020-10-12 ENCOUNTER — Encounter (HOSPITAL_BASED_OUTPATIENT_CLINIC_OR_DEPARTMENT_OTHER): Payer: Self-pay | Admitting: Orthopaedic Surgery

## 2020-10-12 ENCOUNTER — Other Ambulatory Visit: Payer: Self-pay

## 2020-10-12 ENCOUNTER — Encounter (HOSPITAL_BASED_OUTPATIENT_CLINIC_OR_DEPARTMENT_OTHER)
Admission: RE | Disposition: A | Discharge status: Home / Self Care | Payer: Self-pay | Source: Home / Self Care | Attending: Orthopaedic Surgery

## 2020-10-12 ENCOUNTER — Ambulatory Visit (HOSPITAL_BASED_OUTPATIENT_CLINIC_OR_DEPARTMENT_OTHER): Payer: BC Managed Care – PPO | Admitting: Anesthesiology

## 2020-10-12 ENCOUNTER — Ambulatory Visit (HOSPITAL_BASED_OUTPATIENT_CLINIC_OR_DEPARTMENT_OTHER)
Admission: RE | Admit: 2020-10-12 | Discharge: 2020-10-13 | Disposition: A | Discharge status: Home / Self Care | Payer: BC Managed Care – PPO | Attending: Orthopaedic Surgery | Admitting: Orthopaedic Surgery

## 2020-10-12 DIAGNOSIS — Z7902 Long term (current) use of antithrombotics/antiplatelets: Secondary | ICD-10-CM | POA: Diagnosis not present

## 2020-10-12 DIAGNOSIS — M7501 Adhesive capsulitis of right shoulder: Secondary | ICD-10-CM | POA: Diagnosis not present

## 2020-10-12 DIAGNOSIS — Z888 Allergy status to other drugs, medicaments and biological substances status: Secondary | ICD-10-CM | POA: Insufficient documentation

## 2020-10-12 DIAGNOSIS — M7541 Impingement syndrome of right shoulder: Secondary | ICD-10-CM | POA: Diagnosis not present

## 2020-10-12 DIAGNOSIS — Z91018 Allergy to other foods: Secondary | ICD-10-CM | POA: Diagnosis not present

## 2020-10-12 DIAGNOSIS — Z955 Presence of coronary angioplasty implant and graft: Secondary | ICD-10-CM | POA: Diagnosis not present

## 2020-10-12 DIAGNOSIS — S46811A Strain of other muscles, fascia and tendons at shoulder and upper arm level, right arm, initial encounter: Secondary | ICD-10-CM | POA: Diagnosis not present

## 2020-10-12 DIAGNOSIS — Z79899 Other long term (current) drug therapy: Secondary | ICD-10-CM | POA: Diagnosis not present

## 2020-10-12 DIAGNOSIS — S46011A Strain of muscle(s) and tendon(s) of the rotator cuff of right shoulder, initial encounter: Secondary | ICD-10-CM | POA: Diagnosis not present

## 2020-10-12 DIAGNOSIS — Z8249 Family history of ischemic heart disease and other diseases of the circulatory system: Secondary | ICD-10-CM | POA: Diagnosis not present

## 2020-10-12 DIAGNOSIS — Z9889 Other specified postprocedural states: Secondary | ICD-10-CM | POA: Diagnosis present

## 2020-10-12 DIAGNOSIS — M75121 Complete rotator cuff tear or rupture of right shoulder, not specified as traumatic: Secondary | ICD-10-CM | POA: Insufficient documentation

## 2020-10-12 DIAGNOSIS — G8918 Other acute postprocedural pain: Secondary | ICD-10-CM | POA: Diagnosis not present

## 2020-10-12 DIAGNOSIS — Z809 Family history of malignant neoplasm, unspecified: Secondary | ICD-10-CM | POA: Insufficient documentation

## 2020-10-12 DIAGNOSIS — Z823 Family history of stroke: Secondary | ICD-10-CM | POA: Diagnosis not present

## 2020-10-12 DIAGNOSIS — I2510 Atherosclerotic heart disease of native coronary artery without angina pectoris: Secondary | ICD-10-CM | POA: Diagnosis not present

## 2020-10-12 DIAGNOSIS — S46111A Strain of muscle, fascia and tendon of long head of biceps, right arm, initial encounter: Secondary | ICD-10-CM | POA: Diagnosis not present

## 2020-10-12 DIAGNOSIS — M19011 Primary osteoarthritis, right shoulder: Secondary | ICD-10-CM | POA: Insufficient documentation

## 2020-10-12 DIAGNOSIS — I252 Old myocardial infarction: Secondary | ICD-10-CM | POA: Diagnosis not present

## 2020-10-12 DIAGNOSIS — M24111 Other articular cartilage disorders, right shoulder: Secondary | ICD-10-CM | POA: Diagnosis not present

## 2020-10-12 DIAGNOSIS — Z791 Long term (current) use of non-steroidal anti-inflammatories (NSAID): Secondary | ICD-10-CM | POA: Insufficient documentation

## 2020-10-12 DIAGNOSIS — W010XXA Fall on same level from slipping, tripping and stumbling without subsequent striking against object, initial encounter: Secondary | ICD-10-CM | POA: Diagnosis not present

## 2020-10-12 DIAGNOSIS — Y939 Activity, unspecified: Secondary | ICD-10-CM | POA: Diagnosis not present

## 2020-10-12 HISTORY — PX: SHOULDER ARTHROSCOPY WITH ROTATOR CUFF REPAIR: SHX5685

## 2020-10-12 HISTORY — DX: Primary osteoarthritis, right shoulder: M19.011

## 2020-10-12 HISTORY — DX: Personal history of urinary calculi: Z87.442

## 2020-10-12 SURGERY — SHOULDER ARTHROSCOPY WITH SUBACROMIAL DECOMPRESSION AND DISTAL CLAVICLE EXCISION
Anesthesia: General | Laterality: Right

## 2020-10-12 MED ORDER — PHENYLEPHRINE HCL-NACL 10-0.9 MG/250ML-% IV SOLN
INTRAVENOUS | Status: DC | PRN
  Administered 2020-10-12: 50 ug/min via INTRAVENOUS

## 2020-10-12 MED ORDER — OXYCODONE HCL 5 MG PO TABS: 10.0000 mg | ORAL_TABLET | ORAL | Status: DC | PRN

## 2020-10-12 MED ORDER — METHOCARBAMOL 500 MG PO TABS: 500.0000 mg | ORAL_TABLET | Freq: Four times a day (QID) | ORAL | Status: DC | PRN

## 2020-10-12 MED ORDER — ZOLPIDEM TARTRATE 5 MG PO TABS
5.0000 mg | ORAL_TABLET | Freq: Every evening | ORAL | Status: DC | PRN
  Administered 2020-10-12: 5 mg via ORAL
  Filled 2020-10-12: qty 1

## 2020-10-12 MED ORDER — SODIUM CHLORIDE 0.9 % IR SOLN
Status: DC | PRN
  Administered 2020-10-12: 3500 mL

## 2020-10-12 MED ORDER — LIDOCAINE 2% (20 MG/ML) 5 ML SYRINGE
INTRAMUSCULAR | Status: AC
  Filled 2020-10-12: qty 5

## 2020-10-12 MED ORDER — ACETAMINOPHEN 500 MG PO TABS
1000.0000 mg | ORAL_TABLET | Freq: Three times a day (TID) | ORAL | Status: DC
  Administered 2020-10-12 (×2): 1000 mg via ORAL
  Filled 2020-10-12 (×2): qty 2

## 2020-10-12 MED ORDER — OXYCODONE HCL 5 MG PO TABS: 5.0000 mg | ORAL_TABLET | ORAL | Status: DC | PRN

## 2020-10-12 MED ORDER — METOCLOPRAMIDE HCL 5 MG/ML IJ SOLN: 5.0000 mg | Freq: Three times a day (TID) | INTRAMUSCULAR | Status: DC | PRN

## 2020-10-12 MED ORDER — MIDAZOLAM HCL 2 MG/2ML IJ SOLN
2.0000 mg | Freq: Once | INTRAMUSCULAR | Status: AC
  Administered 2020-10-12: 1 mg via INTRAVENOUS

## 2020-10-12 MED ORDER — CELECOXIB 100 MG PO CAPS
100.0000 mg | ORAL_CAPSULE | Freq: Two times a day (BID) | ORAL | Status: DC
  Administered 2020-10-12: 100 mg via ORAL

## 2020-10-12 MED ORDER — CEFAZOLIN SODIUM-DEXTROSE 2-4 GM/100ML-% IV SOLN: 2.0000 g | Freq: Four times a day (QID) | INTRAVENOUS | Status: DC

## 2020-10-12 MED ORDER — HYDROMORPHONE HCL 1 MG/ML IJ SOLN
0.2500 mg | INTRAMUSCULAR | Status: DC | PRN
Start: 2020-10-12 — End: 2020-10-12

## 2020-10-12 MED ORDER — LOSARTAN POTASSIUM 25 MG PO TABS
25.0000 mg | ORAL_TABLET | Freq: Every day | ORAL | Status: DC
  Filled 2020-10-12: qty 1

## 2020-10-12 MED ORDER — BISACODYL 5 MG PO TBEC: 5.0000 mg | DELAYED_RELEASE_TABLET | Freq: Every day | ORAL | Status: DC | PRN

## 2020-10-12 MED ORDER — POLYETHYLENE GLYCOL 3350 17 G PO PACK: 17.0000 g | PACK | Freq: Every day | ORAL | Status: DC | PRN

## 2020-10-12 MED ORDER — LORAZEPAM 1 MG PO TABS: 0.5000 mg | ORAL_TABLET | ORAL | Status: DC | PRN

## 2020-10-12 MED ORDER — LACTATED RINGERS IV SOLN: INTRAVENOUS | Status: DC

## 2020-10-12 MED ORDER — METOPROLOL TARTRATE 25 MG PO TABS
25.0000 mg | ORAL_TABLET | Freq: Two times a day (BID) | ORAL | Status: DC
  Administered 2020-10-12: 25 mg via ORAL

## 2020-10-12 MED ORDER — ONDANSETRON HCL 4 MG/2ML IJ SOLN
INTRAMUSCULAR | Status: DC | PRN
  Administered 2020-10-12: 4 mg via INTRAVENOUS

## 2020-10-12 MED ORDER — ONDANSETRON HCL 4 MG PO TABS: 4.0000 mg | ORAL_TABLET | Freq: Four times a day (QID) | ORAL | Status: DC | PRN

## 2020-10-12 MED ORDER — PROPOFOL 10 MG/ML IV BOLUS
INTRAVENOUS | Status: DC | PRN
  Administered 2020-10-12: 200 mg via INTRAVENOUS

## 2020-10-12 MED ORDER — ACETAMINOPHEN 500 MG PO TABS
1000.0000 mg | ORAL_TABLET | Freq: Once | ORAL | Status: AC
  Administered 2020-10-12: 1000 mg via ORAL

## 2020-10-12 MED ORDER — ONDANSETRON HCL 4 MG/2ML IJ SOLN: 4.0000 mg | Freq: Four times a day (QID) | INTRAMUSCULAR | Status: DC | PRN

## 2020-10-12 MED ORDER — MIDAZOLAM HCL 2 MG/2ML IJ SOLN
INTRAMUSCULAR | Status: AC
  Filled 2020-10-12: qty 2

## 2020-10-12 MED ORDER — EPINEPHRINE PF 1 MG/ML IJ SOLN
INTRAMUSCULAR | Status: AC
  Filled 2020-10-12: qty 1

## 2020-10-12 MED ORDER — SUGAMMADEX SODIUM 200 MG/2ML IV SOLN
INTRAVENOUS | Status: DC | PRN
  Administered 2020-10-12: 200 mg via INTRAVENOUS

## 2020-10-12 MED ORDER — CLOPIDOGREL BISULFATE 75 MG PO TABS
75.0000 mg | ORAL_TABLET | Freq: Every day | ORAL | Status: DC
  Filled 2020-10-12: qty 1

## 2020-10-12 MED ORDER — CELECOXIB 100 MG PO CAPS
ORAL_CAPSULE | ORAL | Status: AC
  Filled 2020-10-12: qty 1

## 2020-10-12 MED ORDER — ONDANSETRON HCL 4 MG/2ML IJ SOLN
INTRAMUSCULAR | Status: AC
  Filled 2020-10-12: qty 2

## 2020-10-12 MED ORDER — DEXAMETHASONE SODIUM PHOSPHATE 10 MG/ML IJ SOLN
INTRAMUSCULAR | Status: AC
  Filled 2020-10-12: qty 1

## 2020-10-12 MED ORDER — ROCURONIUM BROMIDE 10 MG/ML (PF) SYRINGE
PREFILLED_SYRINGE | INTRAVENOUS | Status: AC
  Filled 2020-10-12: qty 10

## 2020-10-12 MED ORDER — GABAPENTIN 300 MG PO CAPS
300.0000 mg | ORAL_CAPSULE | Freq: Three times a day (TID) | ORAL | Status: DC
  Administered 2020-10-12 (×2): 300 mg via ORAL
  Filled 2020-10-12 (×2): qty 1

## 2020-10-12 MED ORDER — DEXAMETHASONE SODIUM PHOSPHATE 4 MG/ML IJ SOLN
INTRAMUSCULAR | Status: DC | PRN
  Administered 2020-10-12: 5 mg via INTRAVENOUS

## 2020-10-12 MED ORDER — FENTANYL CITRATE (PF) 100 MCG/2ML IJ SOLN
INTRAMUSCULAR | Status: AC
  Filled 2020-10-12: qty 2

## 2020-10-12 MED ORDER — DOCUSATE SODIUM 100 MG PO CAPS
100.0000 mg | ORAL_CAPSULE | Freq: Two times a day (BID) | ORAL | Status: DC
  Administered 2020-10-12: 100 mg via ORAL
  Filled 2020-10-12: qty 1

## 2020-10-12 MED ORDER — CEFAZOLIN SODIUM-DEXTROSE 2-4 GM/100ML-% IV SOLN
INTRAVENOUS | Status: AC
  Filled 2020-10-12: qty 100

## 2020-10-12 MED ORDER — ACETAMINOPHEN 500 MG PO TABS
ORAL_TABLET | ORAL | Status: AC
  Filled 2020-10-12: qty 2

## 2020-10-12 MED ORDER — ROCURONIUM BROMIDE 100 MG/10ML IV SOLN
INTRAVENOUS | Status: DC | PRN
  Administered 2020-10-12: 100 mg via INTRAVENOUS

## 2020-10-12 MED ORDER — METOCLOPRAMIDE HCL 5 MG PO TABS: 5.0000 mg | ORAL_TABLET | Freq: Three times a day (TID) | ORAL | Status: DC | PRN

## 2020-10-12 MED ORDER — CEFAZOLIN SODIUM-DEXTROSE 2-4 GM/100ML-% IV SOLN
2.0000 g | INTRAVENOUS | Status: AC
  Administered 2020-10-12: 2 g via INTRAVENOUS

## 2020-10-12 MED ORDER — OXYCODONE HCL 5 MG PO TABS
5.0000 mg | ORAL_TABLET | Freq: Once | ORAL | Status: DC | PRN
Start: 2020-10-12 — End: 2020-10-12

## 2020-10-12 MED ORDER — HYDROMORPHONE HCL 1 MG/ML IJ SOLN: 0.5000 mg | INTRAMUSCULAR | Status: DC | PRN

## 2020-10-12 MED ORDER — OXYCODONE HCL 5 MG/5ML PO SOLN: 5.0000 mg | Freq: Once | ORAL | Status: DC | PRN

## 2020-10-12 MED ORDER — FENTANYL CITRATE (PF) 100 MCG/2ML IJ SOLN
100.0000 ug | Freq: Once | INTRAMUSCULAR | Status: AC
  Administered 2020-10-12: 50 ug via INTRAVENOUS

## 2020-10-12 MED ORDER — ONDANSETRON HCL 4 MG/2ML IJ SOLN: 4.0000 mg | Freq: Once | INTRAMUSCULAR | Status: DC | PRN

## 2020-10-12 MED ORDER — PROPOFOL 500 MG/50ML IV EMUL
INTRAVENOUS | Status: AC
  Filled 2020-10-12: qty 50

## 2020-10-12 MED ORDER — LIDOCAINE HCL (CARDIAC) PF 100 MG/5ML IV SOSY
PREFILLED_SYRINGE | INTRAVENOUS | Status: DC | PRN
  Administered 2020-10-12: 20 mg via INTRAVENOUS

## 2020-10-12 MED ORDER — EPHEDRINE SULFATE 50 MG/ML IJ SOLN
INTRAMUSCULAR | Status: DC | PRN
  Administered 2020-10-12: 10 mg via INTRAVENOUS

## 2020-10-12 MED ORDER — METHOCARBAMOL 1000 MG/10ML IJ SOLN: 500.0000 mg | Freq: Four times a day (QID) | INTRAVENOUS | Status: DC | PRN

## 2020-10-12 SURGICAL SUPPLY — 57 items
ANCHOR SUT BIO SW 4.75X19.1 (Anchor) ×2 IMPLANT
BLADE EXCALIBUR 4.0X13 (MISCELLANEOUS) ×2 IMPLANT
BLADE SURG 10 STRL SS (BLADE) IMPLANT
BURR OVAL 8 FLU 4.0X13 (MISCELLANEOUS) IMPLANT
CANNULA 5.75X71 LONG (CANNULA) IMPLANT
CANNULA PASSPORT 5 (CANNULA) ×2 IMPLANT
CANNULA PASSPORT BUTTON 10-40 (CANNULA) ×2 IMPLANT
CANNULA TWIST IN 8.25X7CM (CANNULA) ×2 IMPLANT
CHLORAPREP W/TINT 26 (MISCELLANEOUS) ×2 IMPLANT
CLSR STERI-STRIP ANTIMIC 1/2X4 (GAUZE/BANDAGES/DRESSINGS) ×2 IMPLANT
COOLER ICEMAN CLASSIC (MISCELLANEOUS) ×2 IMPLANT
COVER WAND RF STERILE (DRAPES) ×2 IMPLANT
DRAPE IMP U-DRAPE 54X76 (DRAPES) ×2 IMPLANT
DRAPE INCISE IOBAN 66X45 STRL (DRAPES) ×2 IMPLANT
DRAPE SHOULDER BEACH CHAIR (DRAPES) ×2 IMPLANT
DRSG PAD ABDOMINAL 8X10 ST (GAUZE/BANDAGES/DRESSINGS) ×2 IMPLANT
DW OUTFLOW CASSETTE/TUBE SET (MISCELLANEOUS) ×2 IMPLANT
GAUZE SPONGE 4X4 12PLY STRL (GAUZE/BANDAGES/DRESSINGS) ×2 IMPLANT
GLOVE ECLIPSE 8.0 STRL XLNG CF (GLOVE) ×2 IMPLANT
GLOVE SRG 8 PF TXTR STRL LF DI (GLOVE) ×1 IMPLANT
GLOVE SURG ENC MOIS LTX SZ6.5 (GLOVE) ×2 IMPLANT
GLOVE SURG UNDER POLY LF SZ6.5 (GLOVE) ×2 IMPLANT
GLOVE SURG UNDER POLY LF SZ8 (GLOVE) ×2
GOWN STRL REUS W/ TWL LRG LVL3 (GOWN DISPOSABLE) ×2 IMPLANT
GOWN STRL REUS W/TWL LRG LVL3 (GOWN DISPOSABLE) ×4
GOWN STRL REUS W/TWL XL LVL3 (GOWN DISPOSABLE) ×2 IMPLANT
IMPL SPEEDBRIDGE KIT (Orthopedic Implant) ×1 IMPLANT
IMPLANT SPEEDBRIDGE KIT (Orthopedic Implant) ×2 IMPLANT
KIT POSITIONER SHLDR ASSISTARM (MISCELLANEOUS) ×2 IMPLANT
KIT STABILIZATION SHOULDER (MISCELLANEOUS) IMPLANT
KIT STR SPEAR 1.8 FBRTK DISP (KITS) IMPLANT
LASSO CRESCENT QUICKPASS (SUTURE) ×2 IMPLANT
MANIFOLD NEPTUNE II (INSTRUMENTS) ×2 IMPLANT
NDL SAFETY ECLIPSE 18X1.5 (NEEDLE) ×1 IMPLANT
NEEDLE HYPO 18GX1.5 SHARP (NEEDLE) ×2
NEEDLE SCORPION MULTI FIRE (NEEDLE) IMPLANT
PACK ARTHROSCOPY DSU (CUSTOM PROCEDURE TRAY) ×2 IMPLANT
PACK BASIN DAY SURGERY FS (CUSTOM PROCEDURE TRAY) ×2 IMPLANT
PAD COLD SHLDR WRAP-ON (PAD) ×2 IMPLANT
PORT APPOLLO RF 90DEGREE MULTI (SURGICAL WAND) ×2 IMPLANT
RESTRAINT HEAD UNIVERSAL NS (MISCELLANEOUS) ×2 IMPLANT
SHEET MEDIUM DRAPE 40X70 STRL (DRAPES) IMPLANT
SLEEVE SCD COMPRESS KNEE MED (MISCELLANEOUS) ×2 IMPLANT
SLING ARM FOAM STRAP LRG (SOFTGOODS) IMPLANT
SUT FIBERWIRE #2 38 T-5 BLUE (SUTURE)
SUT MNCRL AB 4-0 PS2 18 (SUTURE) ×2 IMPLANT
SUT PDS AB 1 CT  36 (SUTURE)
SUT PDS AB 1 CT 36 (SUTURE) IMPLANT
SUT TIGER TAPE 7 IN WHITE (SUTURE) IMPLANT
SUTURE FIBERWR #2 38 T-5 BLUE (SUTURE) IMPLANT
SUTURE TAPE TIGERLINK 1.3MM BL (SUTURE) IMPLANT
SUTURETAPE TIGERLINK 1.3MM BL (SUTURE)
SYR 5ML LL (SYRINGE) ×2 IMPLANT
TAPE FIBER 2MM 7IN #2 BLUE (SUTURE) IMPLANT
TOWEL GREEN STERILE FF (TOWEL DISPOSABLE) ×4 IMPLANT
TUBE CONNECTING 20X1/4 (TUBING) ×2 IMPLANT
TUBING ARTHROSCOPY IRRIG 16FT (MISCELLANEOUS) ×2 IMPLANT

## 2020-10-12 NOTE — Anesthesia Procedure Notes (Signed)
Procedure Name: Intubation Date/Time: 10/12/2020 8:33 AM Performed by: Maryella Shivers, CRNA Pre-anesthesia Checklist: Patient identified, Emergency Drugs available, Suction available and Patient being monitored Patient Re-evaluated:Patient Re-evaluated prior to induction Oxygen Delivery Method: Circle system utilized Preoxygenation: Pre-oxygenation with 100% oxygen Induction Type: IV induction Ventilation: Mask ventilation without difficulty Grade View: Grade III Tube type: Oral Tube size: 8.0 mm Number of attempts: 1 Airway Equipment and Method: Stylet,  Oral airway and Video-laryngoscopy Placement Confirmation: ETT inserted through vocal cords under direct vision,  positive ETCO2 and breath sounds checked- equal and bilateral Secured at: 22 cm Tube secured with: Tape Dental Injury: Teeth and Oropharynx as per pre-operative assessment

## 2020-10-12 NOTE — Transfer of Care (Signed)
Immediate Anesthesia Transfer of Care Note  Patient: Frederick Baker  Procedure(s) Performed: SHOULDER ARTHROSCOPY WITH SUBACROMIAL DECOMPRESSION  PARTIAL ACROMIOPLASTYAND DISTAL CLAVICLE EXCISION (Right ) SHOULDER ARTHROSCOPY WITH ROTATOR CUFF REPAIR (Right )  Patient Location: PACU  Anesthesia Type:GA combined with regional for post-op pain  Level of Consciousness: sedated  Airway & Oxygen Therapy: Patient Spontanous Breathing and Patient connected to face mask oxygen  Post-op Assessment: Report given to RN and Post -op Vital signs reviewed and stable  Post vital signs: Reviewed and stable  Last Vitals:  Vitals Value Taken Time  BP 111/62 10/12/20 1007  Temp    Pulse 81 10/12/20 1009  Resp 19 10/12/20 1009  SpO2 99 % 10/12/20 1009  Vitals shown include unvalidated device data.  Last Pain:  Vitals:   10/12/20 0737  TempSrc: Oral  PainSc: 0-No pain      Patients Stated Pain Goal: 8 (26/33/35 4562)  Complications: No complications documented.

## 2020-10-12 NOTE — Interval H&P Note (Signed)
History and Physical Interval Note:  10/12/2020 8:13 AM  [name]  has presented today for surgery, with the diagnosis of RIGHT SHOULDER PRIMARY OSTEOARTHRITIS IMPINGEMENT STRAIN OF MUSCLE AND TENDON OF ROTAOR CUFF.  The various methods of treatment have been discussed with the patient and family. After consideration of risks, benefits and other options for treatment, the patient has consented to  Procedure(s): SHOULDER ARTHROSCOPY WITH SUBACROMIAL DECOMPRESSION  PARTIAL ACROMIOPLASTYAND DISTAL CLAVICLE EXCISION (Right) SHOULDER ARTHROSCOPY WITH ROTATOR CUFF REPAIR AND OPEN BICEPS TENODESIS (Right) as a surgical intervention.  The patient's history has been reviewed, patient examined, no change in status, stable for surgery.  I have reviewed the patient's chart and labs.  Questions were answered to the patient's satisfaction.     Hiram Gash

## 2020-10-12 NOTE — Progress Notes (Signed)
Assisted Dr. Finucane with right, ultrasound guided, interscalene  block. Side rails up, monitors on throughout procedure. See vital signs in flow sheet. Tolerated Procedure well. ?

## 2020-10-12 NOTE — Anesthesia Postprocedure Evaluation (Signed)
Anesthesia Post Note  Patient: Frederick Baker  Procedure(s) Performed: SHOULDER ARTHROSCOPY WITH SUBACROMIAL DECOMPRESSION  PARTIAL ACROMIOPLASTYAND DISTAL CLAVICLE EXCISION (Right ) SHOULDER ARTHROSCOPY WITH ROTATOR CUFF REPAIR (Right )     Patient location during evaluation: PACU Anesthesia Type: General and Regional Level of consciousness: awake and alert, oriented and patient cooperative Pain management: pain level controlled Vital Signs Assessment: post-procedure vital signs reviewed and stable Respiratory status: spontaneous breathing, nonlabored ventilation and respiratory function stable Cardiovascular status: blood pressure returned to baseline and stable Postop Assessment: no apparent nausea or vomiting Anesthetic complications: no   No complications documented.  Last Vitals:  Vitals:   10/12/20 1030 10/12/20 1045  BP: 105/64   Pulse: 68 61  Resp: 18 17  Temp:    SpO2: 95% 97%    Last Pain:  Vitals:   10/12/20 1008  TempSrc:   PainSc: [place]

## 2020-10-12 NOTE — Op Note (Signed)
Orthopaedic Surgery Operative Note (CSN: [id number])  Frederick Baker  03/30/1961 Date of Surgery: 10/12/2020   Diagnoses:  Right shoulder AC arthrosis, biceps rupture, subscapularis and supraspinatus tearing  Procedure: Arthroscopic extensive debridement Arthroscopic subacromial decompression Arthroscopic rotator cuff repair Arthroscopic distal clavicle excision  Arthroscopic subscapularis repair   Operative Finding Exam under anesthesia: Limited external rotation to about 30 degrees, forward flexion was nearly intact Articular space: No loose bodies, capsule intact, anterior labral fraying was noted Chondral surfaces: Mild changes noted of the glenoid and the humeral head scattered grade 1 overall relatively mild. Biceps: Complete rupture with a small stump left on the superior labrum. Subscapularis: Upper border 30% was torn with a undersurface articular equivalent tear involving about the upper 50%.  Single anchor repair was performed. Superior Cuff: Full-thickness supraspinatus tear noted with most of the infraspinatus still intact.  There was a delaminated layer that appeared to be thickening of the bursa rather than cuff tissue and was not mobile regardless and thus was left alone.  We debrided this back and verified that it was not muscular and was unrelated to the cuff. Bursal side: As above  Successful completion of the planned procedure.  Tissue quality was reasonable and a repair was robust.  We will wait 2 weeks to start therapy however as the patient had poor bone quality with his lateral anchors though our medial row tiedown provided good apposition of the cuff to the bone.  He is at high risk of stiffness however we do not want to lose our repair in the setting of his poor bone quality   Post-operative plan: The patient will be non-weightbearing in a sling for 6 weeks with therapy to start after 2.  The patient will be discharged home.  DVT prophylaxis not indicated in  ambulatory upper extremity patient without known risk factors.   Pain control with PRN pain medication preferring oral medicines.  Follow up plan will be scheduled in approximately 7 days for incision check and XR.  Post-Op Diagnosis: Same Surgeons:Primary: Hiram Gash, MD Assistants:Caroline McBane PA-C Location: [facility] OR ROOM 6 Anesthesia: General with Exparel interscalene block Antibiotics: Ancef 2 g Tourniquet time: None Estimated Blood Loss: Minimal Complications: None Specimens: None Implants: Implant Name Type Inv. Item Serial No. Manufacturer Lot No. LRB No. Used Action  IMPLANT SPEEDBRIDGE KIT - [id number] Orthopedic Implant IMPLANT SPEEDBRIDGE KIT  [place] [id number] Right 1 Implanted  ANCHOR SUT BIO SW 4.75X19.1 - [id number] Anchor ANCHOR SUT BIO SW 4.75X19.1  Rolinda Roan [id number] Right 1 Implanted    Indications for Surgery:   Frederick Baker is a 60 y.o. male with continued shoulder pain refractory to nonoperative measures for extended period of time.    The risks and benefits were explained at length including but not limited to continued pain, cuff failure, biceps tenodesis failure, stiffness, need for further surgery and infection.   Procedure:   Patient was correctly identified in the preoperative holding area and operative site marked.  Patient brought to OR and positioned beachchair on an [place] table ensuring that all bony prominences were padded and the head was in an appropriate location.  Anesthesia was induced and the operative shoulder was prepped and draped in the usual sterile fashion.  Timeout was called preincision.  A standard posterior viewing portal was made after localizing the portal with a spinal needle.  An anterior accessory portal was also made.  After clearing the articular space the camera was positioned in the subacromial space.  Findings above.    Extensive debridement was performed of the anterior interval tissue, labral fraying and the bursa.   We debrided the stump of the biceps extensively and performed an anterior interval release skeletonizing the coracoid and releasing the anterior tissue.  We had much more free external rotation and motion at that point.  Subacromial decompression: We made a lateral portal with spinal needle guidance. We then proceeded to debride bursal tissue extensively with a shaver and arthrocare device. At that point we continued to identify the borders of the acromion and identify the spur. We then carefully preserved the deltoid fascia and used a burr to convert the acromion to a Type 1 flat acromion without issue.  Arthroscopic Rotator Cuff Repair: Tuberosity was prepared with a burr to a bleeding bed.  Following completion of the above we placed 2 4.7 Swivelock anchor loaded with a tape at inserted at the medial articular margin and an scorpion suture passing device, shuttled  sutures medially in a horizontal mattress suture configuration.  We then tied using arthroscopic knot tying techniques  each suture to its partner reducing the tendon at the prepared insertion site.  The fiber tape was not tied. With a medial row suture limbs then incorporated, 2 anteriorly and  2 posteriorly, into each of two 4.75 PEEK SwiveLock anchors, each placed 8 to 10 mm below the tip of the tuberosity and spanning anterior-posterior width of the tear with care to avoid over tensioning.   Distal Clavicle resection:  The scope was placed in the subacromial space from the posterior portal.  A hemostat was placed through the anterior portal and we spread at the [facility] joint.  A burr was then inserted and 10 mm of distal clavicle was resected taking care to avoid damage to the capsule around the joint and avoiding overhanging bone posteriorly.    Subscapularis Repair: We identified a subscapularis tear that involved upper 30% of the entirety of the tendon and up to the 50% mark undersurface articular sided tear.  Using a grasping device were  able to demonstrate that the tendon could be reapproximated to the lesser tuberosity.  We felt that it was repairable.  We then used a RF ablator to open the rotator interval and released the MGHL to allow further translation of the subscapularis.  This point we cleared both anterior and posterior to the tendon taking care to avoid inferior migration to avoid the neurovascular structures as well as the muscular cutaneous nerve.  We then used a lasso to pass a fiber tape in a mattress fashion through the subscapularis and based 4.75 swivel lock was used to repair back to the lesser tuberosity after prepping the tuberosity extensively.  We had good approximation of the tendon and a recreation of the rolled border.  The incisions were closed with absorbable monocryl and steri strips.  A sterile dressing was placed along with a sling. The patient was awoken from general anesthesia and taken to the PACU in stable condition without complication.   Noemi Chapel, PA-C, present and scrubbed throughout the case, critical for completion in a timely fashion, and for retraction, instrumentation, closure.

## 2020-10-13 MED ORDER — BUPIVACAINE LIPOSOME 1.3 % IJ SUSP
INTRAMUSCULAR | Status: DC | PRN
  Administered 2020-10-12: 10 mL via PERINEURAL

## 2020-10-13 MED ORDER — OXYCODONE HCL 5 MG PO TABS: ORAL_TABLET | ORAL | 0 refills | Status: AC

## 2020-10-13 MED ORDER — ONDANSETRON HCL 4 MG PO TABS: 4.0000 mg | ORAL_TABLET | Freq: Three times a day (TID) | ORAL | 0 refills | Status: AC | PRN

## 2020-10-13 MED ORDER — BUPIVACAINE HCL (PF) 0.5 % IJ SOLN
INTRAMUSCULAR | Status: DC | PRN
  Administered 2020-10-12: 20 mL via PERINEURAL

## 2020-10-13 MED ORDER — ACETAMINOPHEN 500 MG PO TABS: 1000.0000 mg | ORAL_TABLET | Freq: Three times a day (TID) | ORAL | 0 refills | Status: AC

## 2020-10-13 MED ORDER — CELECOXIB 100 MG PO CAPS: 100.0000 mg | ORAL_CAPSULE | Freq: Two times a day (BID) | ORAL | 0 refills | Status: AC

## 2020-10-13 NOTE — Addendum Note (Signed)
Addendum  created 10/13/20 0953 by Pervis Hocking, DO   Child order released for a procedure order, Clinical Note Signed, Intraprocedure Blocks edited, Intraprocedure Meds edited

## 2020-10-13 NOTE — Anesthesia Procedure Notes (Signed)
Anesthesia Regional Block: Interscalene brachial plexus block   Pre-Anesthetic Checklist: ,, timeout performed, Correct Patient, Correct Site, Correct Laterality, Correct Procedure, Correct Position, site marked, Risks and benefits discussed,  Surgical consent,  Pre-op evaluation,  At surgeon's request and post-op pain management  Laterality: Right  Prep: Maximum Sterile Barrier Precautions used, chloraprep       Needles:  Injection technique: Single-shot  Needle Type: Echogenic Stimulator Needle     Needle Length: 9cm  Needle Gauge: 22     Additional Needles:   Procedures:,,,, ultrasound used (permanent image in chart),,,,  Narrative:  Start time: 10/12/2020 8:00 AM End time: 10/12/2020 8:10 AM Injection made incrementally with aspirations every 5 mL.  Performed by: Personally  Anesthesiologist: Pervis Hocking, DO  Additional Notes: Monitors applied. No increased pain on injection. No increased resistance to injection. Injection made in 5cc increments. Good needle visualization. Patient tolerated procedure well.

## 2020-10-13 NOTE — Discharge Summary (Signed)
Patient ID: Frederick Baker MRN: [id number] DOB/AGE: 61/14/1962 61 y.o.  Admit date: 10/12/2020 Discharge date: 10/13/2020  Admission Diagnoses:  Right shoulder AC arthrosis, biceps rupture, subscapularis and supraspinatus tearing  Discharge Diagnoses:  Active Problems:   Status post shoulder surgery  Rotator cuff repair  Past Medical History:  Diagnosis Date  . CAD (coronary artery disease)    a. 11/2012 Acute Inf STEMI/Cath/PCI: LM nl, LAD nl, LCX 30p, OM1 30p, RCA 20p, 17m, 95d (3.5x12 promus premier DES), PDA 32m (2.79mm vessel), RPL 100 (2.25x12 promus premier DES), EF 60%;  b. 11/2012 Echo: EF 55%, mild LVH.  Marland Kitchen History of kidney stones   . HTN (hypertension)   . Hyperlipidemia   . Kidney stone   . Obesity (BMI 30-39.9) 04/14/2019  . OSA (obstructive sleep apnea) 04/14/2019   severe OSA with an AHI of 33/hr and severe nocturnal hypoxemia with O2 sats as low as 80%  . OSA (obstructive sleep apnea)    does not use CPAP  . Osteoarthritis of right shoulder     Procedures Performed:  Right shoulder:  Arthroscopic extensive debridement Arthroscopic subacromial decompression Arthroscopic rotator cuff repair Arthroscopic distal clavicle excision  Arthroscopic subscapularis repair  Discharged Condition: good/stable  Hospital Course: Patient brought in as an outpatient for surgery.  He tolerated procedure well.  Due to his untreated OSA and no CPAP at home, patient was kept for monitoring overnight for medical stabilization and pain control. He was found to be stable for DC home the morning after surgery.  Patient was instructed on specific activity restrictions and all questions were answered.  Consults: None  Significant Diagnostic Studies: No additional pertinent studies  Treatments: Surgery  Discharge Exam: Right shoulder: Dressing CDI and sling well fitting,  full and painless ROM throughout hand with [facility] of 0. xillary nerve sensation/motor altered in setting of block  and unable to be fully tested.  Distal motor and sensory altered in setting of block. Warm well perfused hand.  Disposition: Discharge disposition: 01-Home or Self Care     Follow-up: 1 week in office with Dr. Griffin Basil  Discharge Instructions    Call MD for:  redness, tenderness, or signs of infection (pain, swelling, redness, odor or green/yellow discharge around incision site)   Complete by: As directed    Call MD for:  severe uncontrolled pain   Complete by: As directed    Call MD for:  temperature >100.4   Complete by: As directed    Diet - low sodium heart healthy   Complete by: As directed      Allergies as of 10/13/2020      Reactions   Lisinopril    cough   Other    Mushrooms - GI Problems   Statins    Myalgias      Medication List    TAKE these medications   acetaminophen 500 MG tablet Commonly known as: TYLENOL Take 2 tablets (1,000 mg total) by mouth every 8 (eight) hours for 14 days.   celecoxib 100 MG capsule Commonly known as: CeleBREX Take 1 capsule (100 mg total) by mouth 2 (two) times daily.   clopidogrel 75 MG tablet Commonly known as: PLAVIX TAKE 1 TABLET BY MOUTH DAILY GENERIC EQUIVALENT FOR PLAVIX   losartan 25 MG tablet Commonly known as: COZAAR TAKE 1 TABLET BY MOUTH DAILY   metoprolol tartrate 25 MG tablet Commonly known as: LOPRESSOR TAKE 1 TABLET BY MOUTH TWICE DAILY   multivitamin with minerals Tabs tablet Take  1 tablet by mouth daily.   nitroGLYCERIN 0.4 MG SL tablet Commonly known as: Nitrostat Place 1 tablet (0.4 mg total) under the tongue every 5 (five) minutes as needed for chest pain.   ondansetron 4 MG tablet Commonly known as: Zofran Take 1 tablet (4 mg total) by mouth every 8 (eight) hours as needed for up to 7 days for nausea or vomiting.   oxyCODONE 5 MG immediate release tablet Commonly known as: Oxy IR/ROXICODONE Take 1-2 pills every 6 hrs as needed for pain, no more than 6 per day   Probiotic 250 MG Caps Take  by mouth.   Repatha SureClick 226 MG/ML Soaj Generic drug: Evolocumab INJECT 1 PEN INTO THE SKIN EVERY 14 DAYS

## 2020-10-13 NOTE — Discharge Instructions (Signed)
Ophelia Charter MD, MPH Noemi Chapel, PA-C [place] [address], [address] [phone] (tel)   [phone] (fax)   POST-OPERATIVE INSTRUCTIONS - SHOULDER ARTHROSCOPY  WOUND CARE  You may remove the Operative Dressing on Post-Op Day #3 (72hrs after surgery).    Alternatively if you would like you can leave dressing on until follow-up if within 7-8 days but keep it dry.  Leave steri-strips in place until they fall off on their own, usually 2 weeks postop.  There may be a small amount of fluid/bleeding leaking at the surgical site.  o This is normal; the shoulder is filled with fluid during the procedure and can leak for 24-48hrs after surgery.   You may change/reinforce the bandage as needed.   Use the Cryocuff or Ice as often as possible for the first 7 days, then as needed for pain relief. Always keep a towel, ACE wrap or other barrier between the cooling unit and your skin.   You may shower on Post-Op Day #3. Gently pat the area dry. Do not soak the shoulder in water or submerge it. Keep dry incisions as dry as possible.  Do not go swimming in the pool or ocean until 4 weeks after surgery or when otherwise instructed.    EXERCISES/BRACING ? Sling should be used at all times until follow-up.  ? You can remove sling for hygiene.     Please continue to ambulate and do not stay sitting or lying for too long. Perform foot and wrist pumps to assist in circulation.  POST-OP MEDICATIONS- Multimodal approach to pain control  In general your pain will be controlled with a combination of substances.  Prescriptions unless otherwise discussed are electronically sent to your pharmacy.  This is a carefully made plan we use to minimize narcotic use.     ? Celebrex - Anti-inflammatory medication taken on a scheduled basis ? Acetaminophen - Non-narcotic pain medicine taken on a scheduled basis  ? Oxycodone - This is a strong narcotic, to be used only on an as  needed basis for pain. ? Zofran - take as needed for nausea   FOLLOW-UP  If you develop a Fever (?101.5), Redness or Drainage from the surgical incision site, please call our office to arrange for an evaluation.  Please call the office to schedule a follow-up appointment for your suture removal, 10-14 days post-operatively.    HELPFUL INFORMATION   If you had a block, it will wear off between 8-24 hrs postop typically.  This is period when your pain may go from nearly zero to the pain you would have had postop without the block.  This is an abrupt transition but nothing dangerous is happening.  You may take an extra dose of narcotic when this happens.   You may be more comfortable sleeping in a semi-seated position the first few nights following surgery.  Keep a pillow propped under the elbow and forearm for comfort.  If you have a recliner type of chair it might be beneficial.  If not that is fine too, but it would be helpful to sleep propped up with pillows behind your operated shoulder as well under your elbow and forearm.  This will reduce pulling on the suture lines.   When dressing, put your operative arm in the sleeve first.  When getting undressed, take your operative arm out last.  Loose fitting, button-down shirts are recommended.  Often in the first days after surgery you may be more comfortable keeping  your operative arm under your shirt and not through the sleeve.   You may return to work/school in the next couple of days when you feel up to it.  Desk work and typing in the sling is fine.   We suggest you use the pain medication the first night prior to going to bed, in order to ease any pain when the anesthesia wears off. You should avoid taking pain medications on an empty stomach as it will make you nauseous.   You should wean off your narcotic medicines as soon as you are able.  Most patients will be off or using minimal narcotics before their first postop appointment.     Do not drink alcoholic beverages or take illicit drugs when taking pain medications.   It is against the law to drive while taking narcotics.  In some states it is against the law to drive while your arm is in a sling.    Pain medication may make you constipated.  Below are a few solutions to try in this order: - Decrease the amount of pain medication if you aren't having pain. - Drink lots of decaffeinated fluids. - Drink prune juice and/or eat dried prunes   If the first 3 don't work start with additional solutions - Take Colace - an over-the-counter stool softener - Take Senokot - an over-the-counter laxative - Take Miralax - a stronger over-the-counter laxative  For more information including helpful videos and documents visit our website:   [email or web address]     Post Anesthesia Home Care Instructions  Activity: Get plenty of rest for the remainder of the day. A responsible individual must stay with you for 24 hours following the procedure.  For the next 24 hours, DO NOT: -Drive a car -Paediatric nurse -Drink alcoholic beverages -Take any medication unless instructed by your physician -Make any legal decisions or sign important papers.  Meals: Start with liquid foods such as gelatin or soup. Progress to regular foods as tolerated. Avoid greasy, spicy, heavy foods. If nausea and/or vomiting occur, drink only clear liquids until the nausea and/or vomiting subsides. Call your physician if vomiting continues.  Special Instructions/Symptoms: Your throat may feel dry or sore from the anesthesia or the breathing tube placed in your throat during surgery. If this causes discomfort, gargle with warm salt water. The discomfort should disappear within 24 hours.  If you had a scopolamine patch placed behind your ear for the management of post- operative nausea and/or vomiting:  1. The medication in the patch is effective for 72 hours, after  which it should be removed.  Wrap patch in a tissue and discard in the trash. Wash hands thoroughly with soap and water. 2. You may remove the patch earlier than 72 hours if you experience unpleasant side effects which may include dry mouth, dizziness or visual disturbances. 3. Avoid touching the patch. Wash your hands with soap and water after contact with the patch.     Regional Anesthesia Blocks  1. Numbness or the inability to move the "blocked" extremity may last from 3-48 hours after placement. The length of time depends on the medication injected and your individual response to the medication. If the numbness is not going away after 48 hours, call your surgeon.  2. The extremity that is blocked will need to be protected until the numbness is gone and the  Strength has returned. Because you cannot feel it, you will need to take extra care to avoid injury. Because it may be  weak, you may have difficulty moving it or using it. You may not know what position it is in without looking at it while the block is in effect.  3. For blocks in the legs and feet, returning to weight bearing and walking needs to be done carefully. You will need to wait until the numbness is entirely gone and the strength has returned. You should be able to move your leg and foot normally before you try and bear weight or walk. You will need someone to be with you when you first try to ensure you do not fall and possibly risk injury.  4. Bruising and tenderness at the needle site are common side effects and will resolve in a few days.  5. Persistent numbness or new problems with movement should be communicated to the surgeon or the [place] [phone] [place] [phone]).  Information for Discharge Teaching: EXPAREL (bupivacaine liposome injectable suspension)   Your surgeon or anesthesiologist gave you EXPAREL(bupivacaine) to help control your pain after surgery.   EXPAREL is a  local anesthetic that provides pain relief by numbing the tissue around the surgical site.  EXPAREL is designed to release pain medication over time and can control pain for up to 72 hours.  Depending on how you respond to EXPAREL, you may require less pain medication during your recovery.  Possible side effects:  Temporary loss of sensation or ability to move in the area where bupivacaine was injected.  Nausea, vomiting, constipation  Rarely, numbness and tingling in your mouth or lips, lightheadedness, or anxiety may occur.  Call your doctor right away if you think you may be experiencing any of these sensations, or if you have other questions regarding possible side effects.  Follow all other discharge instructions given to you by your surgeon or nurse. Eat a healthy diet and drink plenty of water or other fluids.  If you return to the hospital for any reason within 96 hours following the administration of EXPAREL, it is important for health care providers to know that you have received this anesthetic. A teal colored band has been placed on your arm with the date, time and amount of EXPAREL you have received in order to alert and inform your health care providers. Please leave this armband in place for the full 96 hours following administration, and then you may remove the band.

## 2020-10-18 ENCOUNTER — Encounter (HOSPITAL_BASED_OUTPATIENT_CLINIC_OR_DEPARTMENT_OTHER): Payer: Self-pay | Admitting: Orthopaedic Surgery

## 2020-10-18 DIAGNOSIS — M19011 Primary osteoarthritis, right shoulder: Secondary | ICD-10-CM | POA: Diagnosis not present

## 2020-10-21 ENCOUNTER — Encounter (HOSPITAL_BASED_OUTPATIENT_CLINIC_OR_DEPARTMENT_OTHER): Payer: Self-pay | Admitting: Orthopaedic Surgery

## 2020-10-28 ENCOUNTER — Telehealth: Payer: Self-pay | Admitting: Cardiovascular Disease

## 2020-10-28 NOTE — Telephone Encounter (Signed)
Patient wanted to be sure it is ok to try tylenol PM or melatonin for sleeping.  Since shoulder surgery he is having to sleep in recliner and having difficulty getting comfortable and staying asleep.  Adv it either of these medication are ok to try for short term sleep issues.

## 2020-10-28 NOTE — Telephone Encounter (Signed)
Patient states he recently had shoulder surgery and he has been having trouble sleeping. He would like to know if Dr. Angelena Form has any recommendations for medications the patient is eligible to take to assist with getting better sleep.  Please advise.

## 2020-10-31 DIAGNOSIS — M75121 Complete rotator cuff tear or rupture of right shoulder, not specified as traumatic: Secondary | ICD-10-CM | POA: Diagnosis not present

## 2020-10-31 DIAGNOSIS — M6281 Muscle weakness (generalized): Secondary | ICD-10-CM | POA: Diagnosis not present

## 2020-10-31 DIAGNOSIS — M25611 Stiffness of right shoulder, not elsewhere classified: Secondary | ICD-10-CM | POA: Diagnosis not present

## 2020-10-31 DIAGNOSIS — M25511 Pain in right shoulder: Secondary | ICD-10-CM | POA: Diagnosis not present

## 2020-11-01 ENCOUNTER — Telehealth: Payer: Self-pay | Admitting: *Deleted

## 2020-11-01 DIAGNOSIS — G4733 Obstructive sleep apnea (adult) (pediatric): Secondary | ICD-10-CM

## 2020-11-01 NOTE — Telephone Encounter (Signed)
Rx resent to [place] for cpap 4-20cm H20.

## 2020-11-03 DIAGNOSIS — M75121 Complete rotator cuff tear or rupture of right shoulder, not specified as traumatic: Secondary | ICD-10-CM | POA: Diagnosis not present

## 2020-11-03 DIAGNOSIS — M25611 Stiffness of right shoulder, not elsewhere classified: Secondary | ICD-10-CM | POA: Diagnosis not present

## 2020-11-03 DIAGNOSIS — M25511 Pain in right shoulder: Secondary | ICD-10-CM | POA: Diagnosis not present

## 2020-11-03 DIAGNOSIS — M6281 Muscle weakness (generalized): Secondary | ICD-10-CM | POA: Diagnosis not present

## 2020-11-07 DIAGNOSIS — M25611 Stiffness of right shoulder, not elsewhere classified: Secondary | ICD-10-CM | POA: Diagnosis not present

## 2020-11-07 DIAGNOSIS — M6281 Muscle weakness (generalized): Secondary | ICD-10-CM | POA: Diagnosis not present

## 2020-11-07 DIAGNOSIS — M25511 Pain in right shoulder: Secondary | ICD-10-CM | POA: Diagnosis not present

## 2020-11-07 DIAGNOSIS — M75121 Complete rotator cuff tear or rupture of right shoulder, not specified as traumatic: Secondary | ICD-10-CM | POA: Diagnosis not present

## 2020-11-08 NOTE — Telephone Encounter (Signed)
Patient called in to ask to change his resmed cpap to a Luna cpap. The Order sent to [place].The luna cpaps do not have heated tubing per Wells Guiles at [place].

## 2020-11-08 NOTE — Addendum Note (Signed)
Addended by: Freada Bergeron on: 11/08/2020 02:23 PM   Modules accepted: Orders

## 2020-11-09 ENCOUNTER — Other Ambulatory Visit: Payer: Self-pay | Admitting: Cardiovascular Disease

## 2020-11-10 DIAGNOSIS — M25511 Pain in right shoulder: Secondary | ICD-10-CM | POA: Diagnosis not present

## 2020-11-10 DIAGNOSIS — M75121 Complete rotator cuff tear or rupture of right shoulder, not specified as traumatic: Secondary | ICD-10-CM | POA: Diagnosis not present

## 2020-11-10 DIAGNOSIS — M25611 Stiffness of right shoulder, not elsewhere classified: Secondary | ICD-10-CM | POA: Diagnosis not present

## 2020-11-10 DIAGNOSIS — M6281 Muscle weakness (generalized): Secondary | ICD-10-CM | POA: Diagnosis not present

## 2020-11-14 DIAGNOSIS — M25611 Stiffness of right shoulder, not elsewhere classified: Secondary | ICD-10-CM | POA: Diagnosis not present

## 2020-11-14 DIAGNOSIS — M6281 Muscle weakness (generalized): Secondary | ICD-10-CM | POA: Diagnosis not present

## 2020-11-14 DIAGNOSIS — M25511 Pain in right shoulder: Secondary | ICD-10-CM | POA: Diagnosis not present

## 2020-11-14 DIAGNOSIS — M75121 Complete rotator cuff tear or rupture of right shoulder, not specified as traumatic: Secondary | ICD-10-CM | POA: Diagnosis not present

## 2020-11-15 NOTE — Telephone Encounter (Signed)
Upon patient request DME selection is [facility] Patient understands HE will be contacted by [facility] to set up his cpap. Patient understands to call if [facility] does not contact him with new setup in a timely manner. Patient understands they will be called once confirmation has been received from [place] that they have received their new machine to schedule 10 week follow up appointment.  [place] notified of new cpap order  Please add to airview Patient was grateful for the call and thanked me.

## 2020-11-17 DIAGNOSIS — M25511 Pain in right shoulder: Secondary | ICD-10-CM | POA: Diagnosis not present

## 2020-11-17 DIAGNOSIS — M75121 Complete rotator cuff tear or rupture of right shoulder, not specified as traumatic: Secondary | ICD-10-CM | POA: Diagnosis not present

## 2020-11-17 DIAGNOSIS — M25611 Stiffness of right shoulder, not elsewhere classified: Secondary | ICD-10-CM | POA: Diagnosis not present

## 2020-11-17 DIAGNOSIS — M6281 Muscle weakness (generalized): Secondary | ICD-10-CM | POA: Diagnosis not present

## 2020-11-21 DIAGNOSIS — M6281 Muscle weakness (generalized): Secondary | ICD-10-CM | POA: Diagnosis not present

## 2020-11-21 DIAGNOSIS — M25611 Stiffness of right shoulder, not elsewhere classified: Secondary | ICD-10-CM | POA: Diagnosis not present

## 2020-11-21 DIAGNOSIS — M25511 Pain in right shoulder: Secondary | ICD-10-CM | POA: Diagnosis not present

## 2020-11-21 DIAGNOSIS — M75121 Complete rotator cuff tear or rupture of right shoulder, not specified as traumatic: Secondary | ICD-10-CM | POA: Diagnosis not present

## 2020-11-23 DIAGNOSIS — M25511 Pain in right shoulder: Secondary | ICD-10-CM | POA: Diagnosis not present

## 2020-11-23 DIAGNOSIS — M25611 Stiffness of right shoulder, not elsewhere classified: Secondary | ICD-10-CM | POA: Diagnosis not present

## 2020-11-23 DIAGNOSIS — M6281 Muscle weakness (generalized): Secondary | ICD-10-CM | POA: Diagnosis not present

## 2020-11-23 DIAGNOSIS — M75121 Complete rotator cuff tear or rupture of right shoulder, not specified as traumatic: Secondary | ICD-10-CM | POA: Diagnosis not present

## 2020-11-24 DIAGNOSIS — M25511 Pain in right shoulder: Secondary | ICD-10-CM | POA: Diagnosis not present

## 2020-11-28 DIAGNOSIS — M25511 Pain in right shoulder: Secondary | ICD-10-CM | POA: Diagnosis not present

## 2020-11-28 DIAGNOSIS — M25611 Stiffness of right shoulder, not elsewhere classified: Secondary | ICD-10-CM | POA: Diagnosis not present

## 2020-11-28 DIAGNOSIS — M75121 Complete rotator cuff tear or rupture of right shoulder, not specified as traumatic: Secondary | ICD-10-CM | POA: Diagnosis not present

## 2020-11-28 DIAGNOSIS — M6281 Muscle weakness (generalized): Secondary | ICD-10-CM | POA: Diagnosis not present

## 2020-12-01 DIAGNOSIS — M6281 Muscle weakness (generalized): Secondary | ICD-10-CM | POA: Diagnosis not present

## 2020-12-01 DIAGNOSIS — M25511 Pain in right shoulder: Secondary | ICD-10-CM | POA: Diagnosis not present

## 2020-12-01 DIAGNOSIS — M75121 Complete rotator cuff tear or rupture of right shoulder, not specified as traumatic: Secondary | ICD-10-CM | POA: Diagnosis not present

## 2020-12-01 DIAGNOSIS — M25611 Stiffness of right shoulder, not elsewhere classified: Secondary | ICD-10-CM | POA: Diagnosis not present

## 2020-12-04 ENCOUNTER — Other Ambulatory Visit: Payer: Self-pay | Admitting: Cardiovascular Disease

## 2020-12-05 DIAGNOSIS — M6281 Muscle weakness (generalized): Secondary | ICD-10-CM | POA: Diagnosis not present

## 2020-12-05 DIAGNOSIS — M75121 Complete rotator cuff tear or rupture of right shoulder, not specified as traumatic: Secondary | ICD-10-CM | POA: Diagnosis not present

## 2020-12-05 DIAGNOSIS — M25511 Pain in right shoulder: Secondary | ICD-10-CM | POA: Diagnosis not present

## 2020-12-05 DIAGNOSIS — M25611 Stiffness of right shoulder, not elsewhere classified: Secondary | ICD-10-CM | POA: Diagnosis not present

## 2020-12-08 DIAGNOSIS — M25611 Stiffness of right shoulder, not elsewhere classified: Secondary | ICD-10-CM | POA: Diagnosis not present

## 2020-12-08 DIAGNOSIS — M75121 Complete rotator cuff tear or rupture of right shoulder, not specified as traumatic: Secondary | ICD-10-CM | POA: Diagnosis not present

## 2020-12-08 DIAGNOSIS — M25511 Pain in right shoulder: Secondary | ICD-10-CM | POA: Diagnosis not present

## 2020-12-08 DIAGNOSIS — M6281 Muscle weakness (generalized): Secondary | ICD-10-CM | POA: Diagnosis not present

## 2020-12-13 DIAGNOSIS — M6281 Muscle weakness (generalized): Secondary | ICD-10-CM | POA: Diagnosis not present

## 2020-12-13 DIAGNOSIS — M75121 Complete rotator cuff tear or rupture of right shoulder, not specified as traumatic: Secondary | ICD-10-CM | POA: Diagnosis not present

## 2020-12-13 DIAGNOSIS — M25511 Pain in right shoulder: Secondary | ICD-10-CM | POA: Diagnosis not present

## 2020-12-13 DIAGNOSIS — M25611 Stiffness of right shoulder, not elsewhere classified: Secondary | ICD-10-CM | POA: Diagnosis not present

## 2020-12-15 DIAGNOSIS — M6281 Muscle weakness (generalized): Secondary | ICD-10-CM | POA: Diagnosis not present

## 2020-12-15 DIAGNOSIS — M25611 Stiffness of right shoulder, not elsewhere classified: Secondary | ICD-10-CM | POA: Diagnosis not present

## 2020-12-15 DIAGNOSIS — M75121 Complete rotator cuff tear or rupture of right shoulder, not specified as traumatic: Secondary | ICD-10-CM | POA: Diagnosis not present

## 2020-12-15 DIAGNOSIS — M25511 Pain in right shoulder: Secondary | ICD-10-CM | POA: Diagnosis not present

## 2020-12-19 DIAGNOSIS — M6281 Muscle weakness (generalized): Secondary | ICD-10-CM | POA: Diagnosis not present

## 2020-12-19 DIAGNOSIS — M25511 Pain in right shoulder: Secondary | ICD-10-CM | POA: Diagnosis not present

## 2020-12-19 DIAGNOSIS — M75121 Complete rotator cuff tear or rupture of right shoulder, not specified as traumatic: Secondary | ICD-10-CM | POA: Diagnosis not present

## 2020-12-19 DIAGNOSIS — M25611 Stiffness of right shoulder, not elsewhere classified: Secondary | ICD-10-CM | POA: Diagnosis not present

## 2020-12-22 DIAGNOSIS — M75121 Complete rotator cuff tear or rupture of right shoulder, not specified as traumatic: Secondary | ICD-10-CM | POA: Diagnosis not present

## 2020-12-22 DIAGNOSIS — M6281 Muscle weakness (generalized): Secondary | ICD-10-CM | POA: Diagnosis not present

## 2020-12-22 DIAGNOSIS — M25611 Stiffness of right shoulder, not elsewhere classified: Secondary | ICD-10-CM | POA: Diagnosis not present

## 2020-12-22 DIAGNOSIS — M25511 Pain in right shoulder: Secondary | ICD-10-CM | POA: Diagnosis not present

## 2020-12-26 DIAGNOSIS — M6281 Muscle weakness (generalized): Secondary | ICD-10-CM | POA: Diagnosis not present

## 2020-12-26 DIAGNOSIS — M75121 Complete rotator cuff tear or rupture of right shoulder, not specified as traumatic: Secondary | ICD-10-CM | POA: Diagnosis not present

## 2020-12-26 DIAGNOSIS — M25511 Pain in right shoulder: Secondary | ICD-10-CM | POA: Diagnosis not present

## 2020-12-26 DIAGNOSIS — M25611 Stiffness of right shoulder, not elsewhere classified: Secondary | ICD-10-CM | POA: Diagnosis not present

## 2020-12-29 DIAGNOSIS — M6281 Muscle weakness (generalized): Secondary | ICD-10-CM | POA: Diagnosis not present

## 2020-12-29 DIAGNOSIS — M25511 Pain in right shoulder: Secondary | ICD-10-CM | POA: Diagnosis not present

## 2020-12-29 DIAGNOSIS — M25611 Stiffness of right shoulder, not elsewhere classified: Secondary | ICD-10-CM | POA: Diagnosis not present

## 2021-01-03 DIAGNOSIS — M25611 Stiffness of right shoulder, not elsewhere classified: Secondary | ICD-10-CM | POA: Diagnosis not present

## 2021-01-03 DIAGNOSIS — M6281 Muscle weakness (generalized): Secondary | ICD-10-CM | POA: Diagnosis not present

## 2021-01-03 DIAGNOSIS — M75121 Complete rotator cuff tear or rupture of right shoulder, not specified as traumatic: Secondary | ICD-10-CM | POA: Diagnosis not present

## 2021-01-03 DIAGNOSIS — M25511 Pain in right shoulder: Secondary | ICD-10-CM | POA: Diagnosis not present

## 2021-01-04 DIAGNOSIS — R21 Rash and other nonspecific skin eruption: Secondary | ICD-10-CM | POA: Diagnosis not present

## 2021-01-04 DIAGNOSIS — W57XXXA Bitten or stung by nonvenomous insect and other nonvenomous arthropods, initial encounter: Secondary | ICD-10-CM | POA: Diagnosis not present

## 2021-01-05 DIAGNOSIS — M25611 Stiffness of right shoulder, not elsewhere classified: Secondary | ICD-10-CM | POA: Diagnosis not present

## 2021-01-05 DIAGNOSIS — M6281 Muscle weakness (generalized): Secondary | ICD-10-CM | POA: Diagnosis not present

## 2021-01-05 DIAGNOSIS — M25511 Pain in right shoulder: Secondary | ICD-10-CM | POA: Diagnosis not present

## 2021-01-05 DIAGNOSIS — M75121 Complete rotator cuff tear or rupture of right shoulder, not specified as traumatic: Secondary | ICD-10-CM | POA: Diagnosis not present

## 2021-01-09 DIAGNOSIS — M6281 Muscle weakness (generalized): Secondary | ICD-10-CM | POA: Diagnosis not present

## 2021-01-09 DIAGNOSIS — M25611 Stiffness of right shoulder, not elsewhere classified: Secondary | ICD-10-CM | POA: Diagnosis not present

## 2021-01-09 DIAGNOSIS — M75121 Complete rotator cuff tear or rupture of right shoulder, not specified as traumatic: Secondary | ICD-10-CM | POA: Diagnosis not present

## 2021-01-09 DIAGNOSIS — M25511 Pain in right shoulder: Secondary | ICD-10-CM | POA: Diagnosis not present

## 2021-01-12 DIAGNOSIS — M75121 Complete rotator cuff tear or rupture of right shoulder, not specified as traumatic: Secondary | ICD-10-CM | POA: Diagnosis not present

## 2021-01-12 DIAGNOSIS — M6281 Muscle weakness (generalized): Secondary | ICD-10-CM | POA: Diagnosis not present

## 2021-01-12 DIAGNOSIS — M25511 Pain in right shoulder: Secondary | ICD-10-CM | POA: Diagnosis not present

## 2021-01-12 DIAGNOSIS — M25611 Stiffness of right shoulder, not elsewhere classified: Secondary | ICD-10-CM | POA: Diagnosis not present

## 2021-01-17 DIAGNOSIS — M75121 Complete rotator cuff tear or rupture of right shoulder, not specified as traumatic: Secondary | ICD-10-CM | POA: Diagnosis not present

## 2021-01-17 DIAGNOSIS — M25611 Stiffness of right shoulder, not elsewhere classified: Secondary | ICD-10-CM | POA: Diagnosis not present

## 2021-01-17 DIAGNOSIS — M25511 Pain in right shoulder: Secondary | ICD-10-CM | POA: Diagnosis not present

## 2021-01-17 DIAGNOSIS — M6281 Muscle weakness (generalized): Secondary | ICD-10-CM | POA: Diagnosis not present

## 2021-01-19 DIAGNOSIS — M25611 Stiffness of right shoulder, not elsewhere classified: Secondary | ICD-10-CM | POA: Diagnosis not present

## 2021-01-19 DIAGNOSIS — M6281 Muscle weakness (generalized): Secondary | ICD-10-CM | POA: Diagnosis not present

## 2021-01-19 DIAGNOSIS — M75121 Complete rotator cuff tear or rupture of right shoulder, not specified as traumatic: Secondary | ICD-10-CM | POA: Diagnosis not present

## 2021-01-25 DIAGNOSIS — M75121 Complete rotator cuff tear or rupture of right shoulder, not specified as traumatic: Secondary | ICD-10-CM | POA: Diagnosis not present

## 2021-01-25 DIAGNOSIS — M6281 Muscle weakness (generalized): Secondary | ICD-10-CM | POA: Diagnosis not present

## 2021-01-25 DIAGNOSIS — M25511 Pain in right shoulder: Secondary | ICD-10-CM | POA: Diagnosis not present

## 2021-01-25 DIAGNOSIS — M25611 Stiffness of right shoulder, not elsewhere classified: Secondary | ICD-10-CM | POA: Diagnosis not present

## 2021-02-01 DIAGNOSIS — M6281 Muscle weakness (generalized): Secondary | ICD-10-CM | POA: Diagnosis not present

## 2021-02-01 DIAGNOSIS — M25511 Pain in right shoulder: Secondary | ICD-10-CM | POA: Diagnosis not present

## 2021-02-01 DIAGNOSIS — M25611 Stiffness of right shoulder, not elsewhere classified: Secondary | ICD-10-CM | POA: Diagnosis not present

## 2021-02-01 DIAGNOSIS — M75121 Complete rotator cuff tear or rupture of right shoulder, not specified as traumatic: Secondary | ICD-10-CM | POA: Diagnosis not present

## 2021-02-07 DIAGNOSIS — M25511 Pain in right shoulder: Secondary | ICD-10-CM | POA: Diagnosis not present

## 2021-02-07 DIAGNOSIS — M25611 Stiffness of right shoulder, not elsewhere classified: Secondary | ICD-10-CM | POA: Diagnosis not present

## 2021-02-07 DIAGNOSIS — M75121 Complete rotator cuff tear or rupture of right shoulder, not specified as traumatic: Secondary | ICD-10-CM | POA: Diagnosis not present

## 2021-02-07 DIAGNOSIS — M6281 Muscle weakness (generalized): Secondary | ICD-10-CM | POA: Diagnosis not present

## 2021-02-14 DIAGNOSIS — M75121 Complete rotator cuff tear or rupture of right shoulder, not specified as traumatic: Secondary | ICD-10-CM | POA: Diagnosis not present

## 2021-02-14 DIAGNOSIS — M6281 Muscle weakness (generalized): Secondary | ICD-10-CM | POA: Diagnosis not present

## 2021-02-14 DIAGNOSIS — M25611 Stiffness of right shoulder, not elsewhere classified: Secondary | ICD-10-CM | POA: Diagnosis not present

## 2021-02-14 DIAGNOSIS — M25511 Pain in right shoulder: Secondary | ICD-10-CM | POA: Diagnosis not present

## 2021-03-08 DIAGNOSIS — G4733 Obstructive sleep apnea (adult) (pediatric): Secondary | ICD-10-CM | POA: Diagnosis not present

## 2021-04-06 ENCOUNTER — Telehealth: Payer: Self-pay | Admitting: Cardiovascular Disease

## 2021-04-07 ENCOUNTER — Telehealth: Payer: Self-pay

## 2021-04-07 DIAGNOSIS — G4733 Obstructive sleep apnea (adult) (pediatric): Secondary | ICD-10-CM | POA: Diagnosis not present

## 2021-04-07 DIAGNOSIS — U071 COVID-19: Secondary | ICD-10-CM

## 2021-04-07 NOTE — Telephone Encounter (Signed)
Pt tested positive for Covid he is showing cold systems and low grade fever. Pt is concerned since he is a heart attack  survivor from 2014.pt also has sleep apena  and has sleep pap machine. Pt is concerned if he needs to try to use machine since he is so congested? I will schedule virtual appt for Monday   Pt call back [phone]

## 2021-04-07 NOTE — Telephone Encounter (Signed)
Pt also want to be seen if he can get the antiviral medication for Covid he received his first shingles shot last week and had received his two Covid shots and one booster.

## 2021-04-08 MED ORDER — NIRMATRELVIR/RITONAVIR (PAXLOVID)TABLET
3.0000 | ORAL_TABLET | Freq: Two times a day (BID) | ORAL | 0 refills | Status: AC
Start: 2021-04-08 — End: 2021-04-13

## 2021-04-08 NOTE — Telephone Encounter (Signed)
Noted. Called pt.  Initial sx's started 7/13 with clearing throat. Sick contact with coworker that was positive.  Covid positive test yesterday. Fever around 100.5, some cough, fatigue/myalgias. Has  O2 sat.  No new chest pains. No dyspnea at rest - min with activity.  Drinking fluids. No confusion.  Received initial 2 COVID vaccines and 1 booster.  Recent telephone note reviewed.  Covid risk of complication score of 5.  Last GFR November 2021 was normal at 86.  Meds reviewed, not on statin, takes Repatha.  Start paxlovid full dose today. Possible side effects given, symptomatic care and ER precautions given.

## 2021-04-10 ENCOUNTER — Telehealth (INDEPENDENT_AMBULATORY_CARE_PROVIDER_SITE_OTHER): Payer: BC Managed Care – PPO | Admitting: Family Medicine

## 2021-04-10 VITALS — HR 68 | Temp 98.5°F

## 2021-04-10 DIAGNOSIS — U071 COVID-19: Secondary | ICD-10-CM

## 2021-04-10 DIAGNOSIS — R059 Cough, unspecified: Secondary | ICD-10-CM | POA: Diagnosis not present

## 2021-04-10 MED ORDER — BENZONATATE 100 MG PO CAPS: 100.0000 mg | ORAL_CAPSULE | Freq: Three times a day (TID) | ORAL | 0 refills | Status: DC | PRN

## 2021-04-10 NOTE — Patient Instructions (Addendum)
Continue Paxlovid, tylenol for body aches or fevers if needed. Continue to drink plenty of fluids and rest.   If worsening diarrhea, let me know, but I anticipate your symptoms will improve.  Coricidin HBP, Mucinex or Mucinex DM if needed for cough. Tessalon perles.   Urgent care nearest emergency room if any of your symptoms worsen or new symptoms occur.  Your employer may have specific requirements for return to work, but this information is provided from the CDC. There is a link provided below for more information if needed.   Everyone who has presumed or confirmed COVID-19 should stay home and isolate from other people for at least 5 full days (day 0 is the first day of symptoms or the date of the day of the positive viral test for asymptomatic persons). You can end isolation after 5 full days if you are fever-free for 24 hours without the use of fever-reducing medication and your other symptoms have improved (Loss of taste and smell may persist for weeks or months after recovery and need not delay the end of isolation). You should continue to wear a well-fitting mask around others at home and in public for 5 additional days (day 6 through day 10) after the end of your 5-day isolation period. If you are unable to wear a mask when around others, you should continue to isolate for a full 10 days. Avoid people who have weakened immune systems or are more likely to get very sick from COVID-19, and nursing homes and other high-risk settings, until after at least 10 days.  If you continue to have fever or your other symptoms have not improved after 5 days of isolation, you should wait to end your isolation until you are fever-free for 24 hours without the use of fever-reducing medication and your other symptoms have improved. Continue to wear a well-fitting mask through day 10.   [email or web address].org/.html

## 2021-04-10 NOTE — Progress Notes (Signed)
Virtual Visit via Video Note  I connected with Frederick Baker on 04/10/21 at 11:49 AM by a video enabled telemedicine application and verified that I am speaking with the correct person using two identifiers.  Patient location:home My location: office - [place].    I discussed the limitations, risks, security and privacy concerns of performing an evaluation and management service by telephone and the availability of in person appointments. I also discussed with the patient that there may be a patient responsible charge related to this service. The patient expressed understanding and agreed to proceed, consent obtained  Chief complaint:  Chief Complaint  Patient presents with   Covid Positive    Pt reports tested positive on Friday, sxs started late wed, pt reports has had fever Friday and Saturday, pt reports medication called in over weekend broke fever. Pt reports sinus congestion, cough, reports loose stools with medication started over the weekend    History of Present Illness: Frederick Baker is a 60 y.o. male  Covid 53 infection: Initial symptoms started Wednesday July 13.  Positive test on Friday 7/15.  I called patient on Saturday, July 16.  Initial symptoms of having clear throat, cough, fatigue, myalgias.  No chest pain or dyspnea at rest, was tolerating fluids and no confusion.  He did have 2 COVID vaccines and 1 booster.  COVID risk of complications score was 5.  I started him on Paxlovid on Saturday.  Since Saturday he has noticed some continued sinus congestion, cough, fever on Friday and Saturday (101.6 on Saturday),  but fever broke yesterday am. Temp 98.5 today.  Some loose stools noted with use of Paxlovid. Minimal loose stool 2 days ago. no vomiting, no watery stool since Saturday.  Tylenol pm helps at night. No cough meds used. Has been walking around in home.  Home O2 sat meter - 98% today. No dyspnea at rest.  No chest pain, no confusion, drinking fluids.   Feeling same as Saturday, but better without fever.   Plans out of work this week.    Patient Active Problem List   Diagnosis Date Noted   Status post shoulder surgery 10/12/2020   OSA (obstructive sleep apnea) 04/14/2019   Obesity (BMI 30-39.9) 04/14/2019   CAD (coronary artery disease) 12/16/2012   Hyperlipidemia 12/15/2012   ST elevation myocardial infarction (STEMI) of inferior wall ([place]) 12/13/2012   HTN (hypertension) 12/13/2012   Past Medical History:  Diagnosis Date   CAD (coronary artery disease)    a. 11/2012 Acute Inf STEMI/Cath/PCI: LM nl, LAD nl, LCX 30p, OM1 30p, RCA 20p, 60m, 95d (3.5x12 promus premier DES), PDA 32m (2.21mm vessel), RPL 100 (2.25x12 promus premier DES), EF 60%;  b. 11/2012 Echo: EF 55%, mild LVH.   History of kidney stones    HTN (hypertension)    Hyperlipidemia    Kidney stone    Obesity (BMI 30-39.9) 04/14/2019   OSA (obstructive sleep apnea) 04/14/2019   severe OSA with an AHI of 33/hr and severe nocturnal hypoxemia with O2 sats as low as 80%   OSA (obstructive sleep apnea)    does not use CPAP   Osteoarthritis of right shoulder    Past Surgical History:  Procedure Laterality Date   CARDIAC CATHETERIZATION     COLONOSCOPY     LEFT HEART CATHETERIZATION WITH CORONARY ANGIOGRAM N/A 12/13/2012   Procedure: LEFT HEART CATHETERIZATION WITH CORONARY ANGIOGRAM;  Surgeon: Burnell Blanks, MD;  Location: [facility] CATH LAB;  Service: Cardiovascular;  Laterality: N/A;  MUSCLE BIOPSY     None     PERCUTANEOUS CORONARY STENT INTERVENTION (PCI-S) N/A 12/13/2012   Procedure: PERCUTANEOUS CORONARY STENT INTERVENTION (PCI-S);  Surgeon: Burnell Blanks, MD;  Location: [facility] CATH LAB;  Service: Cardiovascular;  Laterality: N/A;   SHOULDER ARTHROSCOPY WITH ROTATOR CUFF REPAIR Right 10/12/2020   Procedure: SHOULDER ARTHROSCOPY WITH ROTATOR CUFF REPAIR;  Surgeon: Hiram Gash, MD;  Location: [place];  Service: Orthopedics;  Laterality: Right;    Allergies  Allergen Reactions   Lisinopril     cough   Other     Mushrooms - GI Problems   Statins     Myalgias    Prior to Admission medications   Medication Sig Start Date End Date Taking? Authorizing Provider  clopidogrel (PLAVIX) 75 MG tablet TAKE 1 TABLET BY MOUTH DAILY GENERIC EQUIVALENT FOR PLAVIX 09/12/20  Yes Burnell Blanks, MD  losartan (COZAAR) 25 MG tablet TAKE 1 TABLET BY MOUTH DAILY 09/12/20  Yes Burnell Blanks, MD  metoprolol tartrate (LOPRESSOR) 25 MG tablet TAKE 1 TABLET BY MOUTH TWICE DAILY 12/05/20  Yes Burnell Blanks, MD  Multiple Vitamin (MULTIVITAMIN WITH MINERALS) TABS tablet Take 1 tablet by mouth daily.   Yes [provider]  nirmatrelvir/ritonavir EUA (PAXLOVID) TABS Take 3 tablets by mouth 2 (two) times daily for 5 days. (Take nirmatrelvir 150 mg two tablets twice daily for 5 days and ritonavir 100 mg one tablet twice daily for 5 days) Patient GFR is 86. 04/08/21 04/13/21 Yes Wendie Agreste, MD  nitroGLYCERIN (NITROSTAT) 0.4 MG SL tablet Place 1 tablet (0.4 mg total) under the tongue every 5 (five) minutes as needed for chest pain. 01/27/19  Yes Imogene Burn, PA-C  REPATHA SURECLICK 409 MG/ML SOAJ INJECT 1 PEN INTO THE SKIN EVERY 14 DAYS 09/26/20  Yes Burnell Blanks, MD  Saccharomyces boulardii (PROBIOTIC) 250 MG CAPS Take by mouth.   Yes [provider]   Social History   Socioeconomic History   Marital status: Single    Spouse name: Not on file   Number of children: Not on file   Years of education: Not on file   Highest education level: Not on file  Occupational History   Occupation: Works in [place]: [employer]  Tobacco Use   Smoking status: Never   Smokeless tobacco: Never  Vaping Use   Vaping Use: Never used  Substance and Sexual Activity   Alcohol use: Yes    Comment: social   Drug use: No   Sexual activity: Not on file  Other Topics Concern   Not on file  Social  History Narrative   Not on file   Social Determinants of Health   Financial Resource Strain: Not on file  Food Insecurity: Not on file  Transportation Needs: Not on file  Physical Activity: Not on file  Stress: Not on file  Social Connections: Not on file  Intimate Partner Violence: Not on file    Observations/Objective: Vitals:   04/10/21 1121  Pulse: 68  Temp: 98.5 F (36.9 C)  SpO2: 98%  Nontoxic appearance on video, speaking in full sentences, few episodes of cough throughout visit but no respiratory distress.  Coherent responses, euthymic mood.  All questions were answered with understanding of plan expressed.   Assessment and Plan: COVID-19 virus infection  Cough - Plan: benzonatate (TESSALON) 100 MG capsule Stable symptoms from discussion 2 days ago with resolution of fever.  Initial loose  stools with Paxlovid, but improving.  Symptomatic care Tylenol, Mucinex or Mucinex DM, Coricidin HBP is fine.  Tessalon Perles sent to pharmacy if needed.  Continue fluids, rest, ER/urgent care precautions given and handout given regarding isolation and masking precautions per CDC.  Follow Up Instructions: As needed.    I discussed the assessment and treatment plan with the patient. The patient was provided an opportunity to ask questions and all were answered. The patient agreed with the plan and demonstrated an understanding of the instructions.   The patient was advised to call back or seek an in-person evaluation if the symptoms worsen or if the condition fails to improve as anticipated.  I provided 24 minutes of non-face-to-face time during this encounter.   Wendie Agreste, MD

## 2021-04-17 ENCOUNTER — Encounter: Payer: Self-pay | Admitting: Family Medicine

## 2021-04-20 ENCOUNTER — Other Ambulatory Visit: Payer: Self-pay

## 2021-04-20 MED ORDER — LOSARTAN POTASSIUM 25 MG PO TABS: 25.0000 mg | ORAL_TABLET | Freq: Every day | ORAL | 1 refills | Status: DC

## 2021-04-28 DIAGNOSIS — M25511 Pain in right shoulder: Secondary | ICD-10-CM | POA: Diagnosis not present

## 2021-05-08 DIAGNOSIS — G4733 Obstructive sleep apnea (adult) (pediatric): Secondary | ICD-10-CM | POA: Diagnosis not present

## 2021-06-08 DIAGNOSIS — G4733 Obstructive sleep apnea (adult) (pediatric): Secondary | ICD-10-CM | POA: Diagnosis not present

## 2021-07-08 DIAGNOSIS — G4733 Obstructive sleep apnea (adult) (pediatric): Secondary | ICD-10-CM | POA: Diagnosis not present

## 2021-08-08 ENCOUNTER — Other Ambulatory Visit: Payer: Self-pay

## 2021-08-08 DIAGNOSIS — G4733 Obstructive sleep apnea (adult) (pediatric): Secondary | ICD-10-CM | POA: Diagnosis not present

## 2021-08-08 MED ORDER — CLOPIDOGREL BISULFATE 75 MG PO TABS: ORAL_TABLET | ORAL | 0 refills | Status: DC

## 2021-08-09 ENCOUNTER — Other Ambulatory Visit: Payer: Self-pay | Admitting: *Deleted

## 2021-08-09 MED ORDER — METOPROLOL TARTRATE 25 MG PO TABS: 25.0000 mg | ORAL_TABLET | Freq: Two times a day (BID) | ORAL | 2 refills | Status: DC

## 2021-08-12 ENCOUNTER — Encounter: Payer: Self-pay | Admitting: Physician Assistant

## 2021-08-12 NOTE — Progress Notes (Addendum)
Cardiology Office Note    Date:  08/15/2021   ID:  Frederick Baker, DOB 1961-04-09, MRN [id number]  PCP:  Wendie Agreste, MD  Cardiologist:  Lauree Chandler, MD  Electrophysiologist:  None   Chief Complaint: f/u CAD  History of Present Illness:   Frederick Baker is a 60 y.o. male with history of CAD (inferior STEMI 2014 s/p DES to PL branch and distal RCA with preserved LV function), HTN, HLD, OSA, kidney stones who presents for follow-up. Last ETT 2016 was negative for ischemia. Last echo 2018 EF 55-60%, mild LAE, trivial AI. He has not tolerated multiple statins/doses due to muscle aches and also notation of prior abnormal LFTs/CK so has been on PCSK9i.   He is seen back for follow-up overall feeling well without any recent cardiac complaints.No CP, SOB, palpitations, syncope, BRBPR, melena. He does not notice sometime when he sleeps on his side/shoulders he will wake up with a numb sensation in his fingers on both hands. This is not associated with any specific cardiac symptoms. Has not had to use SL NTG. He plans to re-trial CPAP that he has at home as he did not previously tolerate this and did not wish to pursue the [facility] device discussed with ENT.   Labwork independently reviewed: 08/2020 Hgb 15.2, plt 244, 07/2020 K 4.4, Cr 0.96, LFTS ok, LDL 57, trig 169   Past Medical History:  Diagnosis Date   CAD (coronary artery disease)    a. 11/2012 Acute Inf STEMI/Cath/PCI: LM nl, LAD nl, LCX 30p, OM1 30p, RCA 20p, 56m 95d (3.5x12 promus premier DES), PDA 952m2.54m454messel), RPL 100 (2.25x12 promus premier DES), EF 60%;  b. 11/2012 Echo: EF 55%, mild LVH.   History of kidney stones    HTN (hypertension)    Hyperlipidemia    Obesity (BMI 30-39.9) 04/14/2019   OSA (obstructive sleep apnea) 04/14/2019   severe OSA with an AHI of 33/hr and severe nocturnal hypoxemia with O2 sats as low as 80%   Osteoarthritis of right shoulder     Past Surgical History:  Procedure  Laterality Date   CARDIAC CATHETERIZATION     COLONOSCOPY     LEFT HEART CATHETERIZATION WITH CORONARY ANGIOGRAM N/A 12/13/2012   Procedure: LEFT HEART CATHETERIZATION WITH CORONARY ANGIOGRAM;  Surgeon: ChrBurnell BlanksD;  [facility];  Service: Cardiovascular;  Laterality: N/A;   MUSCLE BIOPSY     None     PERCUTANEOUS CORONARY STENT INTERVENTION (PCI-S) N/A 12/13/2012   Procedure: PERCUTANEOUS CORONARY STENT INTERVENTION (PCI-S);  Surgeon: ChrBurnell BlanksD;  [facility];  Service: Cardiovascular;  Laterality: N/A;   SHOULDER ARTHROSCOPY WITH ROTATOR CUFF REPAIR Right 10/12/2020   Procedure: SHOULDER ARTHROSCOPY WITH ROTATOR CUFF REPAIR;  Surgeon: VarHiram GashD;  [facility];  Laterality: Right;    Current Medications: Current Meds  Medication Sig   benzonatate (TESSALON) 100 MG capsule Take 1 capsule (100 mg total) by mouth 3 (three) times daily as needed for cough.   clopidogrel (PLAVIX) 75 MG tablet TAKE 1 TABLET BY MOUTH DAILY GENERIC EQUIVALENT FOR PLAVIX. Please keep upcoming appt in November 2022 with Cardiologist before anymore refills. Thank you   losartan (COZAAR) 25 MG tablet Take 1 tablet (25 mg total) by mouth daily.   metoprolol tartrate (LOPRESSOR) 25 MG tablet Take 1 tablet (25 mg total) by mouth 2 (two) times daily.   Multiple Vitamin (MULTIVITAMIN WITH MINERALS) TABS  tablet Take 1 tablet by mouth daily.   nitroGLYCERIN (NITROSTAT) 0.4 MG SL tablet Place 1 tablet (0.4 mg total) under the tongue every 5 (five) minutes as needed for chest pain.   REPATHA SURECLICK 163 MG/ML SOAJ INJECT 1 PEN INTO THE SKIN EVERY 14 DAYS   Saccharomyces boulardii (PROBIOTIC) 250 MG CAPS Take by mouth.      Allergies:   Lisinopril, Other, and Statins   Social History   Socioeconomic History   Marital status: Single    Spouse name: Not on file   Number of children: Not on file   Years of education:  Not on file   Highest education level: Not on file  Occupational History   Occupation: Works in Equities trader: [employer]  Tobacco Use   Smoking status: Never   Smokeless tobacco: Never  Vaping Use   Vaping Use: Never used  Substance and Sexual Activity   Alcohol use: Yes    Comment: social   Drug use: No   Sexual activity: Not on file  Other Topics Concern   Not on file  Social History Narrative   Not on file   Social Determinants of Health   Financial Resource Strain: Not on file  Food Insecurity: Not on file  Transportation Needs: Not on file  Physical Activity: Not on file  Stress: Not on file  Social Connections: Not on file     Family History:  The patient's family history includes CAD in his paternal uncle; Cancer in his brother; Hypertension in his father and mother; Stroke in his mother.  ROS:   Please see the history of present illness.  All other systems are reviewed and otherwise negative.    EKGs/Labs/Other Studies Reviewed:    Studies reviewed are outlined and summarized above. Reports included below if pertinent.  2D echo 04-4535 - Left ventricle: The cavity size was normal. Systolic function was    normal. The estimated ejection fraction was in the range of 55%    to 60%. Wall motion was normal; there were no regional wall    motion abnormalities. Left ventricular diastolic function    parameters were normal.  - Aortic valve: There was trivial regurgitation.  - Left atrium: The atrium was mildly dilated.  - Atrial septum: No defect or patent foramen ovale was identified.  ETT 2016 There was no ST segment deviation noted during stress.   Patient presents today for routine GXT. Seen for Dr. Angelena Form. Has had CAD with prior PCI. No active symptoms noted. Admits that he feels "nervous" at times when trying to do his normal activities.    Resting BP is 122/76 Target HR is 140 met at 7:14   Today the patient exercised on the standard  Bruce protocol for a total of 9 minutes.  Good exercise tolerance.  Adequate blood pressure response.    Max HR is 146 Max BP is 210/81   Clinically negative for chest pain. Test was stopped due to achievement of target HR.  EKG negative for ischemia. No significant arrhythmia noted.    Recommendations: CV risk factor modification.  See back as planned.     Patient is agreeable to this plan and will call if any problems develop in the interim.    Frederick Junes, RN, [place] [address] [place], [place]  [address] [phone]  Cath 2014   Cardiac Catheterization Operative Report   Frederick Baker  [id number] 3/22/20144:14 PM No primary provider on file.   Procedure Performed:  Left Heart Catheterization Selective Coronary Angiography Left ventricular angiogram 4.   PTCA/DES x 1 posterolateral branch, right 5.   PTCA/DES x 1 distal RCA 6.   Angioseal right femoral artery   Operator: Lauree Chandler, MD   Indication:  60 yo male with history of HTN, HLD admitted with inferior STEMI.               Arrival in cath lab at [facility] at 4:18pm. Arterial access at 4:28pm.                          Procedure Details: Emergency consent obtained. The risks, benefits, complications, treatment options, and expected outcomes were discussed with the patient. The patient and/or family concurred with the proposed plan, giving verbal  informed consent. The patient was further sedated with Versed and Fentanyl. The right groin was prepped and draped in the usual manner. Using the modified Seldinger access technique, a 6 French sheath was placed in the right femoral artery. Standard diagnostic catheters were used to perform selective coronary angiography.    The RCA was engaged with a JR4 guide. The patient was given a bolus of Angiomax and a drip was started. He was given Effient 60 mg po x 1. A BMW wire was passed down the RCA  into the posterolateral branch but I could not get this wire past the total occlusion so a Whisper wire was passed distally. A 2.0 x 8 mm balloon was used to open the totally occluded posterolateral branch. I then carefully positioned and deployed a 2.25 x 12 mm Promus Premier DES in the posterolateral branch. This was post-dilated with a 2.25 x 8 mm [place] balloon. I then turned by attention to the severe stenosis in the distal RCA. I elected to treat the distal RCA lesion today as it was severe, hazy and could limit inflow into the newly placed more distal stent. The distal RCA stenosis was directly stented with a 3.5 x 12 mm Promus Premier DES. This stent was post-dilated with a 3.5 x 8 mm [place] balloon. There was an excellent result.    A pigtail catheter was used to perform a left ventricular angiogram. An Angioseal femoral artery closure device was placed in the right femoral artery.    There were no immediate complications. The patient was taken to the recovery area in stable condition.    Hemodynamic Findings: Central aortic pressure: 136/89 Left ventricular pressure: 140/7/17   Angiographic Findings:   Left main: No obstructive disease.    Left Anterior Descending Artery: Large caliber vessel that courses to the apex. Mild diffuse plaque in the proximal, mid and distal segments.    Circumflex Artery: Moderate caliber vessel with moderate caliber first obtuse marginal branch. The proximal Circumflex and the proximal segment of the OM branch have 30% stenosis. Small to moderate caliber intermediate branch.    Right Coronary Artery: Large, dominant vessel with diffuse 20% stenosis proximal vessel, 40% stenosis mid vessel, 95% hazy stenosis distal vessel. The PDA is small in caliber (2.0 mm) and has a 95% mid stenosis. The posterolateral branch is moderate in caliber and has a 100% total occlusion.    Left Ventricular Angiogram: LVEF 60%   Impression: 1. Acute inferior STEMI secondary to  occluded posterolateral branch 2. Successful PTCA/DES in the posterolateral branch 3. Successful PTCA/DES x 1 distal RCA 4. Preserved LV systolic function  Recommendations: He will be admitted to the CCU. He will need ASA and Effient for at least one year. Will start Lopressor. He is noted to have severely elevated LFTs and severe muscle aches in past on statins. Will not start a statin.  Echo before d/c home.        Complications:  None. The patient tolerated the procedure well.                     Electronically signed by Burnell Blanks, MD at 12/13/2012  5:31 PM  Electronically signed by Burnell Blanks, MD at 12/13/2012  5:46 PM    EKG:  EKG is ordered today, personally reviewed, demonstrating NSR 63bpm, old inferior infarct, TWI III similar to prior  Recent Labs: 08/25/2020: Hemoglobin 15.2; Platelets 244  Recent Lipid Panel    Component Value Date/Time   CHOL 125 07/29/2020 1127   TRIG 169 (H) 07/29/2020 1127   HDL 40 07/29/2020 1127   CHOLHDL 3.1 07/29/2020 1127   CHOLHDL 2.9 03/29/2016 0752   VLDL 27 03/29/2016 0752   LDLCALC 57 07/29/2020 1127    PHYSICAL EXAM:    VS:  BP 122/82   Pulse 63   Ht 5' 9"  (1.753 m)   Wt 232 lb 12.8 oz (105.6 kg)   SpO2 97%   BMI 34.38 kg/m   BMI: Body mass index is 34.38 kg/m.  GEN: Well nourished, well developed male in no acute distress HEENT: normocephalic, atraumatic Neck: no JVD, carotid bruits, or masses. No subclavian bruits Cardiac: RRR; no murmurs, rubs, or gallops, no edema  Respiratory:  clear to auscultation bilaterally, normal work of breathing GI: soft, nontender, nondistended, + BS MS: no deformity or atrophy Skin: warm and dry, no rash Neuro:  Alert and Oriented x 3, Strength and sensation are intact, follows commands Psych: euthymic mood, full affect  Wt Readings from Last 3 Encounters:  08/15/21 232 lb 12.8 oz (105.6 kg)  10/12/20 227 lb 1.2 oz (103 kg)  08/25/20 225 lb 6.4 oz (102.2 kg)      ASSESSMENT & PLAN:   1. CAD - doing well, clinically stable. Continue Plavix monotherapy, metoprolol, Repatha. Check CBC, CMET, lipid profile today.   2. Essential HTN - BP controlled. No changes made today to BB, ARB. F/u labs today. He also does not have a baseline thyroid on file. Will obtain TSH.  3. Hyperlipidemia with history of statin myopathy/elevated CK level - check CMET/lipid profile today. Refill Repatha. Of note, he reports a prior hx of elevated CK level. Last checked 2015. Has not been on statin for quite some time. He describes numbness in fingers at night when lying in a particular position, which sounds neuropathic in nature - have advised he discuss with primary care. Pulses are equal bilaterally and no subclavian or carotid bruits. Will obtain a f/u CK level since he has been off statin to ensure this eventually normalized.  4. OSA - patient plans to re-trial CPAP at home and will call to schedule f/u with Dr. Radford Pax when he is ready.  Patient requested labs be drawn in prep before next 1 year OV. Will place order for CMET, lipid profile, CBC. Patient advised that sometimes if there are any problems discussed at the next OV, additional testing may be needed, and that there are other maintenance labs that may need to be checked by primary care.     Disposition: F/u with Dr. Angelena Form in 1 year. Also encouraged f/u  with Dr. Radford Pax for OSA.   Medication Adjustments/Labs and Tests Ordered: Current medicines are reviewed at length with the patient today.  Concerns regarding medicines are outlined above. Medication changes, Labs and Tests ordered today are summarized above and listed in the Patient Instructions accessible in Encounters.   Signed, Charlie Pitter, PA-C  08/15/2021 10:23 AM    [facility] [place], [place], [place]  [address] Phone: [phone]; Fax: [phone]

## 2021-08-14 DIAGNOSIS — H524 Presbyopia: Secondary | ICD-10-CM | POA: Diagnosis not present

## 2021-08-14 DIAGNOSIS — H2513 Age-related nuclear cataract, bilateral: Secondary | ICD-10-CM | POA: Diagnosis not present

## 2021-08-14 DIAGNOSIS — H35033 Hypertensive retinopathy, bilateral: Secondary | ICD-10-CM | POA: Diagnosis not present

## 2021-08-14 DIAGNOSIS — H04123 Dry eye syndrome of bilateral lacrimal glands: Secondary | ICD-10-CM | POA: Diagnosis not present

## 2021-08-14 DIAGNOSIS — H25013 Cortical age-related cataract, bilateral: Secondary | ICD-10-CM | POA: Diagnosis not present

## 2021-08-15 ENCOUNTER — Encounter: Payer: Self-pay | Admitting: Physician Assistant

## 2021-08-15 ENCOUNTER — Ambulatory Visit: Payer: BC Managed Care – PPO | Admitting: Physician Assistant

## 2021-08-15 ENCOUNTER — Other Ambulatory Visit: Payer: Self-pay

## 2021-08-15 VITALS — BP 122/82 | HR 63 | Ht 69.0 in | Wt 232.8 lb

## 2021-08-15 DIAGNOSIS — I2510 Atherosclerotic heart disease of native coronary artery without angina pectoris: Secondary | ICD-10-CM

## 2021-08-15 DIAGNOSIS — R200 Anesthesia of skin: Secondary | ICD-10-CM

## 2021-08-15 DIAGNOSIS — R748 Abnormal levels of other serum enzymes: Secondary | ICD-10-CM

## 2021-08-15 DIAGNOSIS — G4733 Obstructive sleep apnea (adult) (pediatric): Secondary | ICD-10-CM

## 2021-08-15 DIAGNOSIS — I10 Essential (primary) hypertension: Secondary | ICD-10-CM

## 2021-08-15 DIAGNOSIS — E785 Hyperlipidemia, unspecified: Secondary | ICD-10-CM

## 2021-08-15 LAB — COMPREHENSIVE METABOLIC PANEL
ALT: 33 IU/L (ref 0–44)
AST: 23 IU/L (ref 0–40)
Albumin/Globulin Ratio: 1.6 (ref 1.2–2.2)
Albumin: 4.6 g/dL (ref 3.8–4.8)
Alkaline Phosphatase: 61 IU/L (ref 44–121)
BUN/Creatinine Ratio: 19 (ref 10–24)
BUN: 18 mg/dL (ref 8–27)
Bilirubin Total: 0.6 mg/dL (ref 0.0–1.2)
CO2: 27 mmol/L (ref 20–29)
Calcium: 8.8 mg/dL (ref 8.6–10.2)
Chloride: 100 mmol/L (ref 96–106)
Creatinine, Ser: 0.96 mg/dL (ref 0.76–1.27)
Globulin, Total: 2.9 g/dL (ref 1.5–4.5)
Glucose: 84 mg/dL (ref 70–99)
Potassium: 4.4 mmol/L (ref 3.5–5.2)
Sodium: 140 mmol/L (ref 134–144)
Total Protein: 7.5 g/dL (ref 6.0–8.5)
eGFR: 90 mL/min/{1.73_m2} (ref >59)

## 2021-08-15 LAB — LIPID PANEL
Chol/HDL Ratio: 3.6 ratio (ref 0.0–5.0)
Cholesterol, Total: 139 mg/dL (ref 100–199)
HDL: 39 mg/dL — ABNORMAL LOW (ref >39)
LDL Chol Calc (NIH): 64 mg/dL (ref 0–99)
Triglycerides: 219 mg/dL — ABNORMAL HIGH (ref 0–149)
VLDL Cholesterol Cal: 36 mg/dL (ref 5–40)

## 2021-08-15 LAB — CBC
Hematocrit: 44.9 % (ref 37.5–51.0)
Hemoglobin: 15.3 g/dL (ref 13.0–17.7)
MCH: 33.7 pg — ABNORMAL HIGH (ref 26.6–33.0)
MCHC: 34.1 g/dL (ref 31.5–35.7)
MCV: 99 fL — ABNORMAL HIGH (ref 79–97)
Platelets: 244 10*3/uL (ref 150–450)
RBC: 4.54 x10E6/uL (ref 4.14–5.80)
RDW: 11.9 % (ref 11.6–15.4)
WBC: 7.1 10*3/uL (ref 3.4–10.8)

## 2021-08-15 LAB — CK: Total CK: 386 U/L — ABNORMAL HIGH (ref 41–331)

## 2021-08-15 MED ORDER — LOSARTAN POTASSIUM 25 MG PO TABS: 25.0000 mg | ORAL_TABLET | Freq: Every day | ORAL | 3 refills | Status: DC

## 2021-08-15 MED ORDER — METOPROLOL TARTRATE 25 MG PO TABS: 25.0000 mg | ORAL_TABLET | Freq: Two times a day (BID) | ORAL | 3 refills | Status: DC

## 2021-08-15 MED ORDER — REPATHA SURECLICK 140 MG/ML ~~LOC~~ SOAJ: SUBCUTANEOUS | 11 refills | Status: DC

## 2021-08-15 MED ORDER — CLOPIDOGREL BISULFATE 75 MG PO TABS: ORAL_TABLET | ORAL | 3 refills | Status: DC

## 2021-08-15 NOTE — Patient Instructions (Addendum)
Medication Instructions:  Your physician recommends that you continue on your current medications as directed. Please refer to the Current Medication list given to you today.  *If you need a refill on your cardiac medications before your next appointment, please call your pharmacy*   Lab Work: TODAY:  CMET, CBC, CK, & LIPID NEXT YEAR BEFORE YOUR APPOINTMENT:  CMET, LIPID, & CBC  If you have labs (blood work) drawn today and your tests are completely normal, you will receive your results only by: [place] (if you have MyChart) OR A paper copy in the mail If you have any lab test that is abnormal or we need to change your treatment, we will call you to review the results.   Testing/Procedures: None ordered   Follow-Up: At [facility], you and your health needs are our priority.  As part of our continuing mission to provide you with exceptional heart care, we have created designated Provider Care Teams.  These Care Teams include your primary Cardiologist (physician) and Advanced Practice Providers (APPs -  Physician Assistants and Nurse Practitioners) who all work together to provide you with the care you need, when you need it.  We recommend signing up for the patient portal called "MyChart".  Sign up information is provided on this After Visit Summary.  MyChart is used to connect with patients for Virtual Visits (Telemedicine).  Patients are able to view lab/test results, encounter notes, upcoming appointments, etc.  Non-urgent messages can be sent to your provider as well.   To learn more about what you can do with MyChart, go to [email or web address].    Your next appointment:   12 month(s)  The format for your next appointment:   In Person  Provider:   Lauree Chandler, MD     Other Instructions

## 2021-08-20 ENCOUNTER — Other Ambulatory Visit: Payer: Self-pay | Admitting: Cardiovascular Disease

## 2021-09-29 ENCOUNTER — Other Ambulatory Visit: Payer: Self-pay

## 2021-09-29 DIAGNOSIS — I2510 Atherosclerotic heart disease of native coronary artery without angina pectoris: Secondary | ICD-10-CM

## 2021-09-29 MED ORDER — NITROGLYCERIN 0.4 MG SL SUBL: 0.4000 mg | SUBLINGUAL_TABLET | SUBLINGUAL | 2 refills | Status: DC | PRN

## 2021-09-29 MED ORDER — CLOPIDOGREL BISULFATE 75 MG PO TABS: ORAL_TABLET | ORAL | 3 refills | Status: DC

## 2021-09-29 MED ORDER — LOSARTAN POTASSIUM 25 MG PO TABS: 25.0000 mg | ORAL_TABLET | Freq: Every day | ORAL | 3 refills | Status: DC

## 2021-10-02 ENCOUNTER — Other Ambulatory Visit: Payer: Self-pay

## 2021-10-02 DIAGNOSIS — I2510 Atherosclerotic heart disease of native coronary artery without angina pectoris: Secondary | ICD-10-CM

## 2021-10-02 MED ORDER — LOSARTAN POTASSIUM 25 MG PO TABS: 25.0000 mg | ORAL_TABLET | Freq: Every day | ORAL | 3 refills | Status: DC

## 2021-10-02 MED ORDER — LOSARTAN POTASSIUM 25 MG PO TABS: 25.0000 mg | ORAL_TABLET | Freq: Every day | ORAL | 1 refills | Status: DC

## 2021-10-02 MED ORDER — NITROGLYCERIN 0.4 MG SL SUBL: 0.4000 mg | SUBLINGUAL_TABLET | SUBLINGUAL | 1 refills | Status: DC | PRN

## 2021-12-29 ENCOUNTER — Encounter: Payer: Self-pay | Admitting: Family Medicine

## 2022-01-10 ENCOUNTER — Other Ambulatory Visit: Payer: Self-pay | Admitting: *Deleted

## 2022-01-10 DIAGNOSIS — I2510 Atherosclerotic heart disease of native coronary artery without angina pectoris: Secondary | ICD-10-CM

## 2022-01-10 MED ORDER — NITROGLYCERIN 0.4 MG SL SUBL: 0.4000 mg | SUBLINGUAL_TABLET | SUBLINGUAL | 1 refills | Status: DC | PRN

## 2022-01-29 NOTE — Telephone Encounter (Signed)
NA

## 2022-02-16 IMAGING — MR MR SHOULDER*R* W/O CM
5 series · 35 of 40 positions shown · non-contrast
Comparison: None.

CLINICAL DATA: Right shoulder pain since the patient suffered an
injury in a fall 06/03/2020.

EXAM:
MRI OF THE RIGHT SHOULDER WITHOUT CONTRAST
TECHNIQUE: Multiplanar, multisequence MR imaging of the shoulder was performed.
No intravenous contrast was administered.

[Series 3: PD fat-sat · axial · 4.0mm · 0.59mm/px · z∈[+2,+83]mm · 8 of 20 slices shown]
[im 1/20]
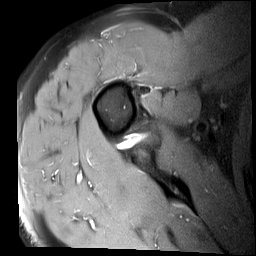
[im 3/20]
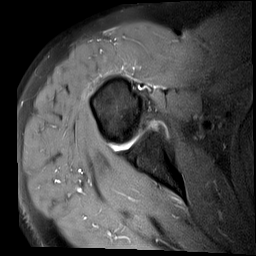
[im 6/20]
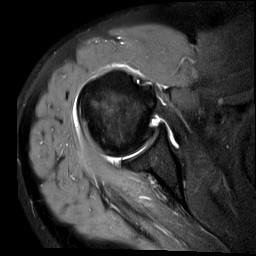
[im 9/20]
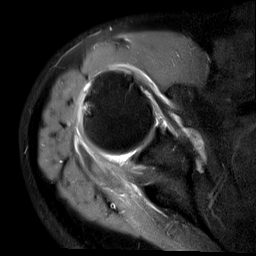
[im 11/20]
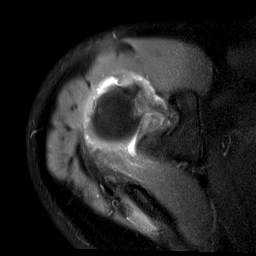
[im 14/20]
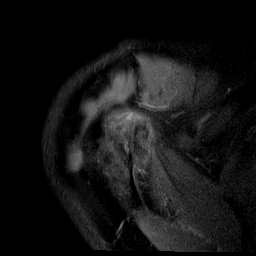
[im 17/20]
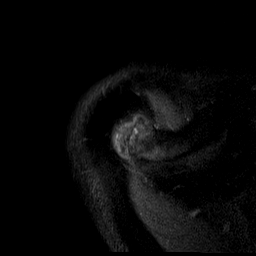
[im 20/20]
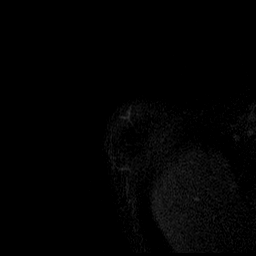

[Series 4: T2 fat-sat · oblique · 4.0mm · 0.59mm/px · 7 of 20 slices shown (1 of 2)]
[im 1/20]
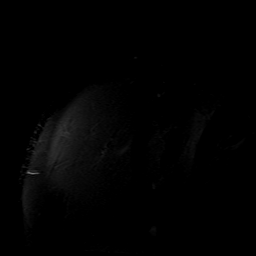
[im 4/20]
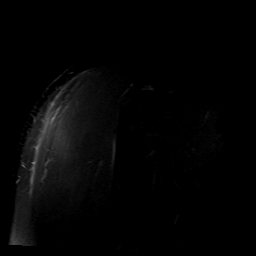
[im 7/20]
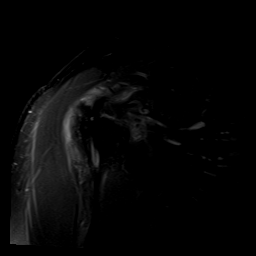
[im 10/20]
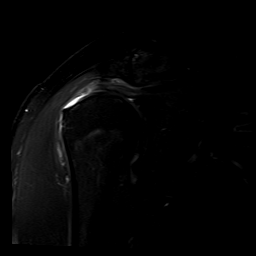
[im 13/20]
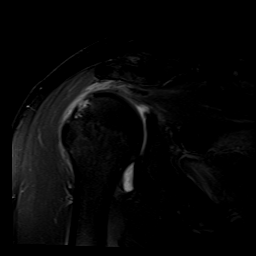
[im 16/20]
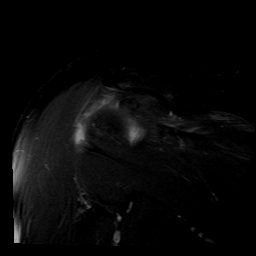
[im 20/20]
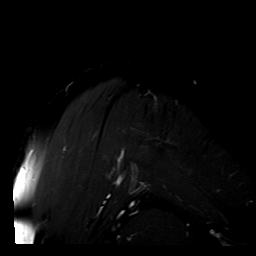

[Series 5: PD · oblique · 4.0mm · 0.29mm/px · 7 of 20 slices shown]
[im 1/20]
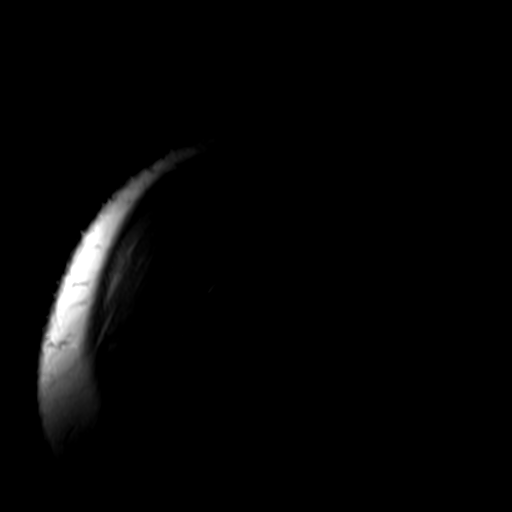
[im 4/20]
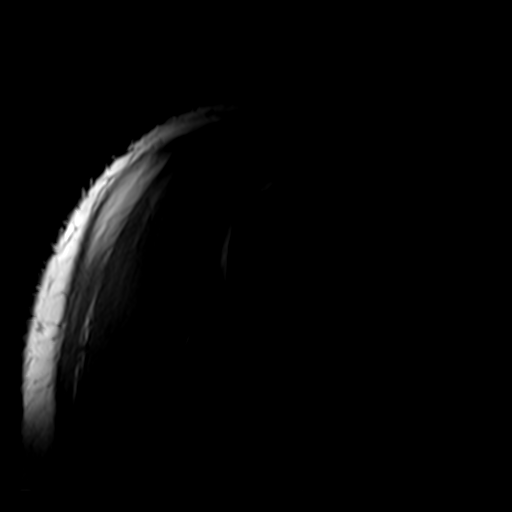
[im 7/20]
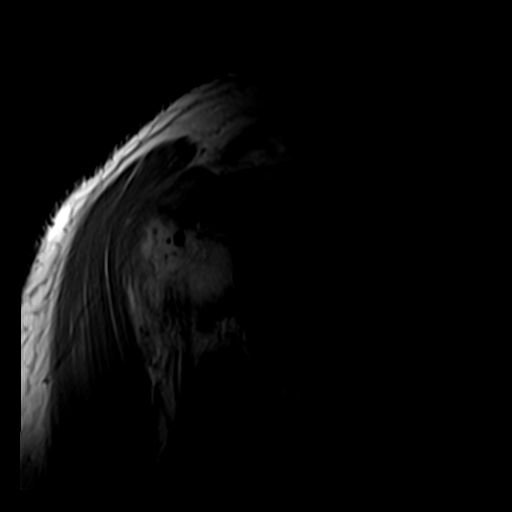
[im 10/20]
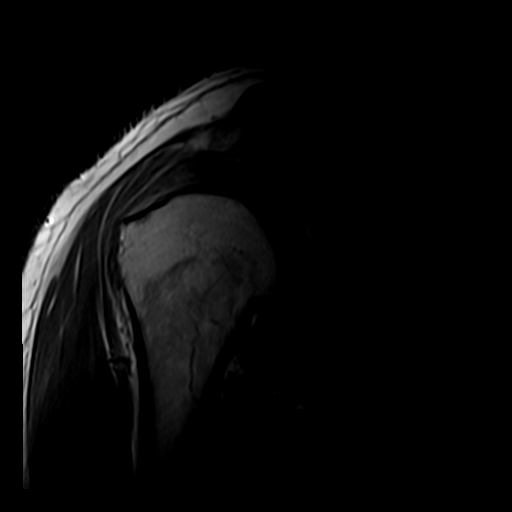
[im 13/20]
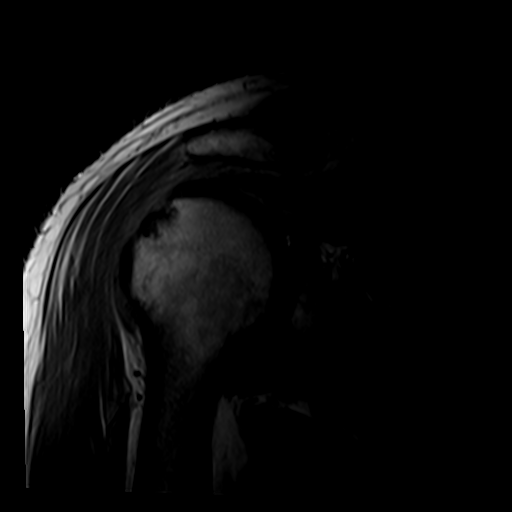
[im 16/20]
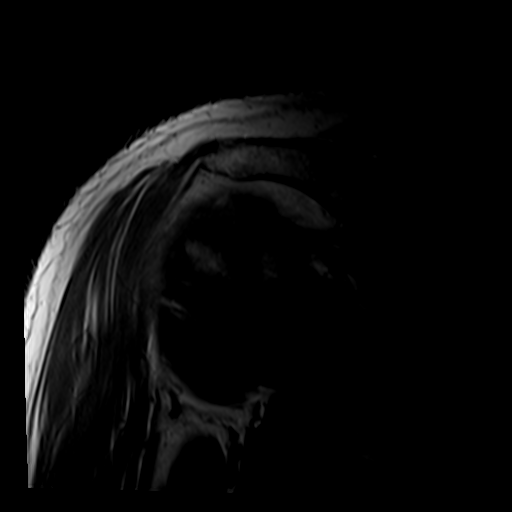
[im 20/20]
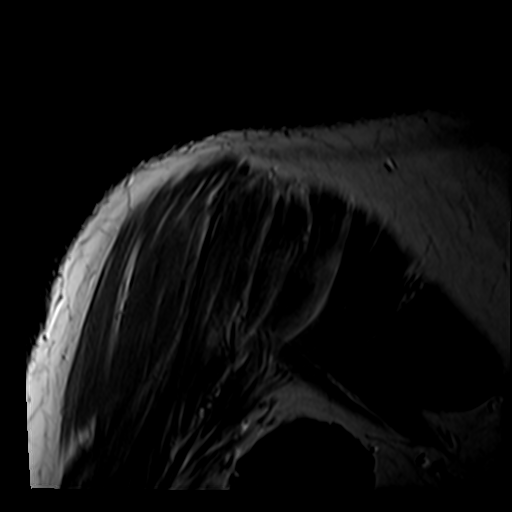

[Series 6: T1 · oblique · 4.0mm · 0.29mm/px · 4 of 26 slices shown]
[im 1/26]
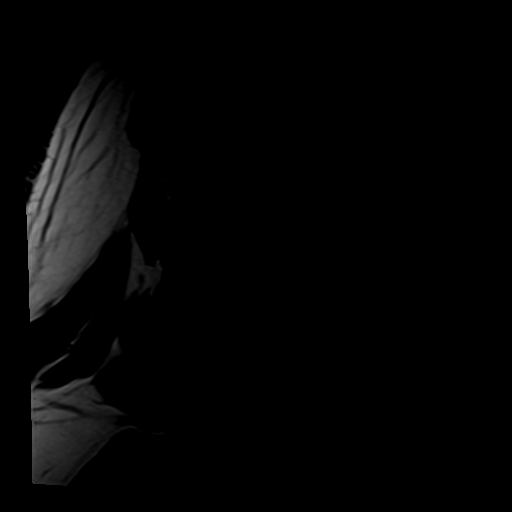
[im 4/26]
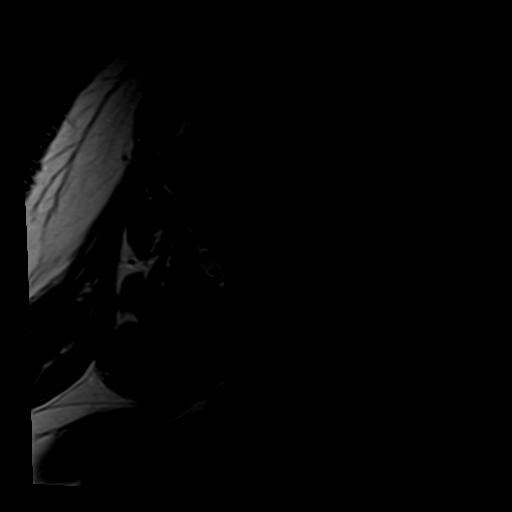
[im 7/26]
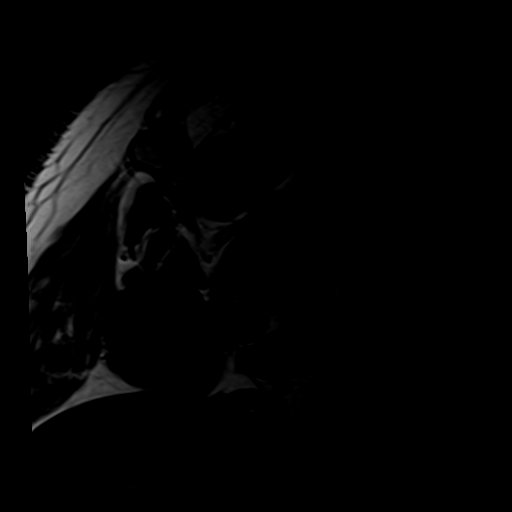
[im 10/26]
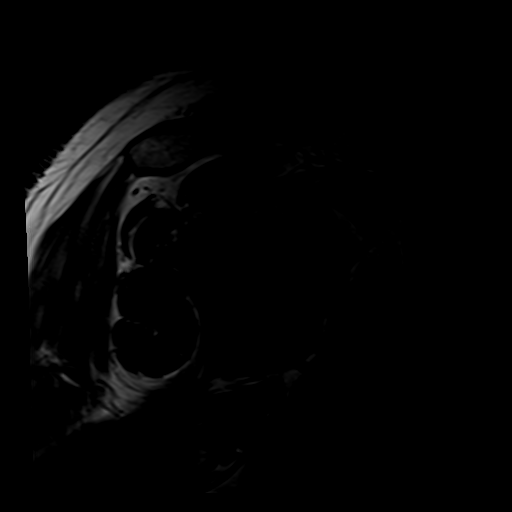

[Series 7: T2 fat-sat · oblique · 4.0mm · 0.55mm/px · 9 of 26 slices shown (2 of 2)]
[im 1/26]
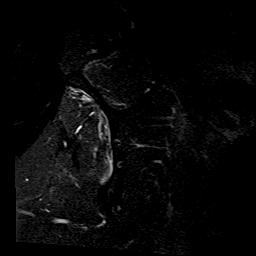
[im 4/26]
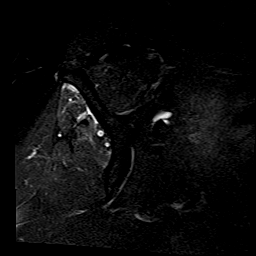
[im 7/26]
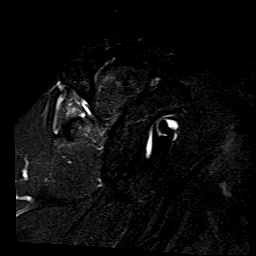
[im 10/26]
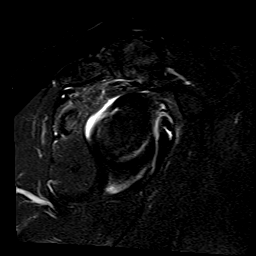
[im 13/26]
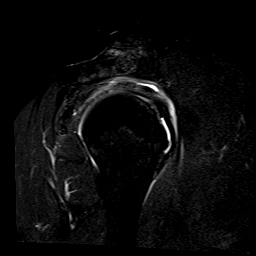
[im 16/26]
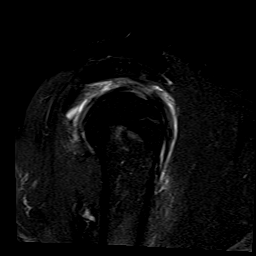
[im 19/26]
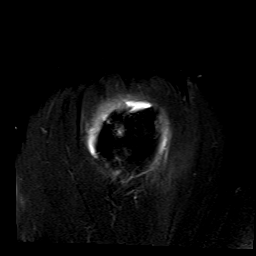
[im 22/26]
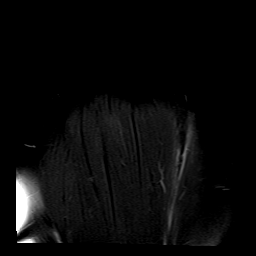
[im 26/26]
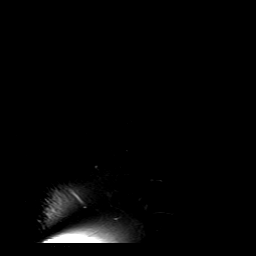

[35 of 40 positions shown; findings below may reference images not displayed]

FINDINGS: Rotator cuff: The patient has complete supraspinatus and
infraspinatus tendon tears with retraction of 2-3 cm. Also seen is
severe subscapularis tendinopathy with undersurface tearing
involving approximately the superior 0.9 cm of the tendon and
longitudinal split tearing more inferiorly. Retraction is 1-2 cm
superiorly.

Muscles:  No atrophy or focal lesion.

Biceps long head:  Completely torn from the superior labrum.

Acromioclavicular Joint: Bulky osteoarthritis. Type 2 acromion.
There is fluid in the subacromial/subdeltoid bursa.

Glenohumeral Joint: Mild-to-moderate osteoarthritis with cartilage
thinning and a small osteophyte off the humeral head.

Labrum: The posterior labrum is severely degenerated with virtually
no normal appearing labral tissue identified. Blunting along the
superior labrum noted.

Bones:  No fracture or focal lesion.

Other: None.
IMPRESSION: Complete supraspinatus and infraspinatus tendon tears with 2-3 cm of
retraction. No atrophy.

Severe subscapularis tendinopathy with undersurface and longitudinal
split tearing. Laminar retraction of the undersurface tear 1-2 cm is
identified. No atrophy.

Complete tear of the long head of biceps from the superior labrum.

Bulky acromioclavicular and mild-to-moderate glenohumeral
osteoarthritis.

Blunted superior labrum. No normal-appearing labrum is seen
posteriorly consistent with degenerative maceration.

Subacromial/subdeltoid fluid compatible with bursitis.

## 2022-06-06 ENCOUNTER — Other Ambulatory Visit: Payer: Self-pay | Admitting: Cardiovascular Disease

## 2022-06-08 ENCOUNTER — Telehealth: Payer: Self-pay

## 2022-06-11 NOTE — Patient Outreach (Signed)
  Care Coordination   Initial Visit Note    Name: Frederick Baker MRN: [id number] DOB: 1961/08/27  Frederick Baker is a 61 y.o. year old male who sees Frederick Agreste, MD for primary care. I spoke with  Frederick Baker by phone today.  What matters to the patients health and wellness today?  Health Maintenance    Goals Addressed             This Visit's Progress    Health Maintenance       Care Coordination Interventions: Reviewed plan for disease management. Reports adhering to plan and attending medical appointments as scheduled.  Reviewed medications. Reports managing well. Denies concerns r/t medication management or prescription cost. Assessed social determinant of health barriers. Will contact Cardiology clinic to confirm date of next follow-up.        SDOH assessments and interventions completed:  Yes  SDOH Interventions Today    Flowsheet Row Most Recent Value  SDOH Interventions   Food Insecurity Interventions Intervention Not Indicated  Transportation Interventions Intervention Not Indicated        Care Coordination Interventions Activated:  Yes  Care Coordination Interventions:  Yes, provided   Follow up plan:  Frederick Baker will call for assistance as needed.    Encounter Outcome:  Pt. Visit Completed    [facility] [phone]

## 2022-06-11 NOTE — Patient Instructions (Signed)
Visit Information  Thank you for allowing the Care Management team to participate in your care.   Following are the goals we discussed today:   Goals Addressed             This Visit's Progress    Health Maintenance       Care Coordination Interventions: Reviewed plan for disease management. Reports adhering to plan and attending medical appointments as scheduled.  Reviewed medications. Reports managing well. Denies concerns r/t medication management or prescription cost. Assessed social determinant of health barriers. Will contact Cardiology clinic to confirm date of next follow-up.        Mr. Beedle verbalized understanding of the information discussed during the telephonic outreach. Declined need for mailed instructions or resources.  Mr. Gittins will call for care coordination assistance as needed.  [facility] [phone]

## 2022-06-18 ENCOUNTER — Other Ambulatory Visit: Payer: Self-pay | Admitting: Cardiovascular Disease

## 2022-07-17 ENCOUNTER — Telehealth: Payer: Self-pay | Admitting: Pharmacist

## 2022-07-17 NOTE — Telephone Encounter (Signed)
Repatha PA submitted  Key: [id number]

## 2022-07-19 MED ORDER — REPATHA SURECLICK 140 MG/ML ~~LOC~~ SOAJ: SUBCUTANEOUS | 3 refills | Status: DC

## 2022-07-19 NOTE — Telephone Encounter (Signed)
PA approved through 07/16/23.

## 2022-08-14 DIAGNOSIS — H524 Presbyopia: Secondary | ICD-10-CM | POA: Diagnosis not present

## 2022-08-14 DIAGNOSIS — H35033 Hypertensive retinopathy, bilateral: Secondary | ICD-10-CM | POA: Diagnosis not present

## 2022-08-14 DIAGNOSIS — H04123 Dry eye syndrome of bilateral lacrimal glands: Secondary | ICD-10-CM | POA: Diagnosis not present

## 2022-08-14 DIAGNOSIS — H25813 Combined forms of age-related cataract, bilateral: Secondary | ICD-10-CM | POA: Diagnosis not present

## 2022-08-20 NOTE — Progress Notes (Signed)
Chief Complaint  Patient presents with   Follow-up    CAD   History of Present Illness: 61 yo male with history of CAD, HTN, HLD and sleep apnea who is here today for followup. He was admitted to [facility] 12/13/12 with an inferior STEMI. The RCA was occluded distally. I placed a drug eluting stent in the posterolateral branch and a drug eluting stent in the distal RCA. Echo 12/15/12 with LVEF=55% post inferior MI with inferobasal hypokinesis. He has not tolerated statins in the past despite many attempts in primary care with many different meds and different dosages. He is on Praluent injections. Nuclear stress test in 2016 showed no ischemia. Echo September 2018 with normal LV function, LVEF=55%, no significant valve disease.   He is here today for follow up. The patient denies any chest pain, dyspnea, palpitations, lower extremity edema, orthopnea, PND, dizziness, near syncope or syncope.   Primary Care Physician: Shade Flood, MD   Past Medical History:  Diagnosis Date   CAD (coronary artery disease)    a. 11/2012 Acute Inf STEMI/Cath/PCI: LM nl, LAD nl, LCX 30p, OM1 30p, RCA 20p, 82m, 95d (3.5x12 promus premier DES), PDA 56m (2.77mm vessel), RPL 100 (2.25x12 promus premier DES), EF 60%;  b. 11/2012 Echo: EF 55%, mild LVH.   History of kidney stones    HTN (hypertension)    Hyperlipidemia    Obesity (BMI 30-39.9) 04/14/2019   OSA (obstructive sleep apnea) 04/14/2019   severe OSA with an AHI of 33/hr and severe nocturnal hypoxemia with O2 sats as low as 80%   Osteoarthritis of right shoulder     Past Surgical History:  Procedure Laterality Date   CARDIAC CATHETERIZATION     COLONOSCOPY     LEFT HEART CATHETERIZATION WITH CORONARY ANGIOGRAM N/A 12/13/2012   Procedure: LEFT HEART CATHETERIZATION WITH CORONARY ANGIOGRAM;  Surgeon: Kathleene Hazel, MD;  Location: [facility] CATH LAB;  Service: Cardiovascular;  Laterality: N/A;   MUSCLE BIOPSY     None     PERCUTANEOUS CORONARY STENT  INTERVENTION (PCI-S) N/A 12/13/2012   Procedure: PERCUTANEOUS CORONARY STENT INTERVENTION (PCI-S);  Surgeon: Kathleene Hazel, MD;  Location: [facility] CATH LAB;  Service: Cardiovascular;  Laterality: N/A;   SHOULDER ARTHROSCOPY WITH ROTATOR CUFF REPAIR Right 10/12/2020   Procedure: SHOULDER ARTHROSCOPY WITH ROTATOR CUFF REPAIR;  Surgeon: Bjorn Pippin, MD;  Location: [facility];  Service: Orthopedics;  Laterality: Right;    Current Outpatient Medications  Medication Sig Dispense Refill   benzonatate (TESSALON) 100 MG capsule Take 1 capsule (100 mg total) by mouth 3 (three) times daily as needed for cough. 20 capsule 0   clopidogrel (PLAVIX) 75 MG tablet TAKE 1 TABLET BY MOUTH DAILY 90 tablet 3   Evolocumab (REPATHA SURECLICK) 140 MG/ML SOAJ INJECT 1 PEN SUBCUTANEOUS EVERY 14 DAILY 6 mL 3   losartan (COZAAR) 25 MG tablet Take 1 tablet by mouth daily. 90 tablet 1   metoprolol tartrate (LOPRESSOR) 25 MG tablet Take 1 tablet by mouth twice daily. **Generic equivalent for Lopressor.** 180 tablet 0   Multiple Vitamin (MULTIVITAMIN WITH MINERALS) TABS tablet Take 1 tablet by mouth daily.     nitroGLYCERIN (NITROSTAT) 0.4 MG SL tablet Place 1 tablet (0.4 mg total) under the tongue every 5 (five) minutes as needed for chest pain. 25 tablet 1   Saccharomyces boulardii (PROBIOTIC) 250 MG CAPS Take by mouth.     No current facility-administered medications for this visit.    Allergies  Allergen Reactions   Lisinopril     cough   Other     Mushrooms - GI Problems   Statins     Myalgias     Social History   Socioeconomic History   Marital status: Single    Spouse name: Not on file   Number of children: Not on file   Years of education: Not on file   Highest education level: Not on file  Occupational History   Occupation: Works in Musician: [employer]  Tobacco Use   Smoking status: Never   Smokeless tobacco: Never  Vaping Use   Vaping Use: Never used   Substance and Sexual Activity   Alcohol use: Yes    Comment: social   Drug use: No   Sexual activity: Not on file  Other Topics Concern   Not on file  Social History Narrative   Not on file   Social Determinants of Health   Financial Resource Strain: Not on file  Food Insecurity: No Food Insecurity (06/08/2022)   Hunger Vital Sign    Worried About Running Out of Food in the Last Year: Never true    Ran Out of Food in the Last Year: Never true  Transportation Needs: No Transportation Needs (06/08/2022)   PRAPARE - Administrator, Civil Service (Medical): No    Lack of Transportation (Non-Medical): No  Physical Activity: Not on file  Stress: Not on file  Social Connections: Not on file  Intimate Partner Violence: Not on file    Family History  Problem Relation Age of Onset   Stroke Mother    Hypertension Mother    Hypertension Father    CAD Paternal Uncle    Cancer Brother     Review of Systems:  As stated in the HPI and otherwise negative.   BP 122/80   Pulse 62   Ht 5\' 9"  (1.753 m)   Wt 238 lb 12.8 oz (108.3 kg)   SpO2 97%   BMI 35.26 kg/m   Physical Examination: General: Well developed, well nourished, NAD  HEENT: OP clear, mucus membranes moist  SKIN: warm, dry. No rashes. Neuro: No focal deficits  Musculoskeletal: Muscle strength 5/5 all ext  Psychiatric: Mood and affect normal  Neck: No JVD, no carotid bruits, no thyromegaly, no lymphadenopathy.  Lungs:Clear bilaterally, no wheezes, rhonci, crackles Cardiovascular: Regular rate and rhythm. No murmurs, gallops or rubs. Abdomen:Soft. Bowel sounds present. Non-tender.  Extremities: No lower extremity edema. Pulses are 2 + in the bilateral DP/PT.  Cardiac cath 12/13/12: Left main: No obstructive disease.  Left Anterior Descending Artery: Large caliber vessel that courses to the apex. Mild diffuse plaque in the proximal, mid and distal segments.  Circumflex Artery: Moderate caliber vessel with  moderate caliber first obtuse marginal branch. The proximal Circumflex and the proximal segment of the OM branch have 30% stenosis. Small to moderate caliber intermediate branch.  Right Coronary Artery: Large, dominant vessel with diffuse 20% stenosis proximal vessel, 40% stenosis mid vessel, 95% hazy stenosis distal vessel. The PDA is small in caliber (2.0 mm) and has a 95% mid stenosis. The posterolateral branch is moderate in caliber and has a 100% total occlusion.  Left Ventricular Angiogram: LVEF 60%  EKG:  EKG is ordered today. The ekg ordered today demonstrates sinus, Non specific ST abn  Recent Labs: No results found for requested labs within last 365 days.   Lipid Panel    Component Value Date/Time  CHOL 139 08/15/2021 1048   TRIG 219 (H) 08/15/2021 1048   HDL 39 (L) 08/15/2021 1048   CHOLHDL 3.6 08/15/2021 1048   CHOLHDL 2.9 03/29/2016 0752   VLDL 27 03/29/2016 0752   LDLCALC 64 08/15/2021 1048     Wt Readings from Last 3 Encounters:  08/21/22 238 lb 12.8 oz (108.3 kg)  08/15/21 232 lb 12.8 oz (105.6 kg)  10/12/20 227 lb 1.2 oz (103 kg)    Assessment and Plan:   1. CAD without angina: No chest pain. Echo in September 2018 with normal LV systolic function. Will continue Plavix, beta blocker and Repatha. He is intolerant of statins.    2. HTN: BP is well controlled. No changes  3. HLD: LDL 64 in November 2022. Continue Repatha. Repeat lipids and LFTs now    4. Sleep apnea: He has had a sleep study and had a visit with Dr. Mayford Knife in July 2020. He did not tolerate CPAP.    Labs/ tests ordered today include:   Orders Placed This Encounter  Procedures   Lipid panel   Hepatic function panel   EKG 12-Lead   Disposition:   F/U with me in 12  months  Signed, Verne Carrow, MD 08/21/2022 10:09 AM    [facility] [address] [place], [place], [place]  [address] Phone: [phone]; Fax: [phone]

## 2022-08-21 ENCOUNTER — Ambulatory Visit: Payer: BC Managed Care – PPO | Attending: Cardiovascular Disease | Admitting: Cardiovascular Disease

## 2022-08-21 ENCOUNTER — Encounter: Payer: Self-pay | Admitting: Cardiovascular Disease

## 2022-08-21 VITALS — BP 122/80 | HR 62 | Ht 69.0 in | Wt 238.8 lb

## 2022-08-21 DIAGNOSIS — G4733 Obstructive sleep apnea (adult) (pediatric): Secondary | ICD-10-CM

## 2022-08-21 DIAGNOSIS — E785 Hyperlipidemia, unspecified: Secondary | ICD-10-CM

## 2022-08-21 DIAGNOSIS — I10 Essential (primary) hypertension: Secondary | ICD-10-CM | POA: Diagnosis not present

## 2022-08-21 DIAGNOSIS — I2510 Atherosclerotic heart disease of native coronary artery without angina pectoris: Secondary | ICD-10-CM | POA: Diagnosis not present

## 2022-08-21 LAB — HEPATIC FUNCTION PANEL
ALT: 36 IU/L (ref 0–44)
AST: 25 IU/L (ref 0–40)
Albumin: 4.5 g/dL (ref 3.9–4.9)
Alkaline Phosphatase: 62 IU/L (ref 44–121)
Bilirubin Total: 0.4 mg/dL (ref 0.0–1.2)
Bilirubin, Direct: 0.13 mg/dL (ref 0.00–0.40)
Total Protein: 7.4 g/dL (ref 6.0–8.5)

## 2022-08-21 LAB — LIPID PANEL
Chol/HDL Ratio: 3.8 ratio (ref 0.0–5.0)
Cholesterol, Total: 148 mg/dL (ref 100–199)
HDL: 39 mg/dL — ABNORMAL LOW (ref >39)
LDL Chol Calc (NIH): 77 mg/dL (ref 0–99)
Triglycerides: 188 mg/dL — ABNORMAL HIGH (ref 0–149)
VLDL Cholesterol Cal: 32 mg/dL (ref 5–40)

## 2022-08-21 NOTE — Patient Instructions (Signed)
Medication Instructions:  No changes *If you need a refill on your cardiac medications before your next appointment, please call your pharmacy*   Lab Work: Today: lipids/liver function   Testing/Procedures: none   Follow-Up: At [facility], you and your health needs are our priority.  As part of our continuing mission to provide you with exceptional heart care, we have created designated Provider Care Teams.  These Care Teams include your primary Cardiologist (physician) and Advanced Practice Providers (APPs -  Physician Assistants and Nurse Practitioners) who all work together to provide you with the care you need, when you need it.    Your next appointment:   12 month(s)  The format for your next appointment:   In Person  Provider:   Lauree Chandler, MD      Important Information About Sugar

## 2022-09-03 ENCOUNTER — Ambulatory Visit (INDEPENDENT_AMBULATORY_CARE_PROVIDER_SITE_OTHER): Payer: BC Managed Care – PPO | Admitting: Family Medicine

## 2022-09-03 ENCOUNTER — Encounter: Payer: Self-pay | Admitting: Family Medicine

## 2022-09-03 VITALS — BP 136/76 | HR 61 | Temp 98.5°F | Ht 69.0 in | Wt 240.2 lb

## 2022-09-03 DIAGNOSIS — G4733 Obstructive sleep apnea (adult) (pediatric): Secondary | ICD-10-CM | POA: Diagnosis not present

## 2022-09-03 DIAGNOSIS — D229 Melanocytic nevi, unspecified: Secondary | ICD-10-CM

## 2022-09-03 DIAGNOSIS — Z131 Encounter for screening for diabetes mellitus: Secondary | ICD-10-CM | POA: Diagnosis not present

## 2022-09-03 DIAGNOSIS — L821 Other seborrheic keratosis: Secondary | ICD-10-CM

## 2022-09-03 DIAGNOSIS — H9313 Tinnitus, bilateral: Secondary | ICD-10-CM

## 2022-09-03 DIAGNOSIS — Z0000 Encounter for general adult medical examination without abnormal findings: Secondary | ICD-10-CM | POA: Diagnosis not present

## 2022-09-03 DIAGNOSIS — Z125 Encounter for screening for malignant neoplasm of prostate: Secondary | ICD-10-CM | POA: Diagnosis not present

## 2022-09-03 DIAGNOSIS — M79672 Pain in left foot: Secondary | ICD-10-CM | POA: Diagnosis not present

## 2022-09-03 NOTE — Progress Notes (Unsigned)
Subjective:  Patient ID: Frederick Baker, male    DOB: 1961-05-15  Age: 61 y.o. MRN: [id number]  CC:  Chief Complaint  Patient presents with   Annual Exam    Pt seen cardiologist and they check his cholesterol  Pt states all is well    Foot Pain    Pt states his left foot hurts when he twist to the side or stand on something uneven    back    Pt wants you to look at his back to make sure nothing is going on     HPI Frederick Baker presents for Annual Exam, and other concerns above. Also with ear ringing.   Cardiac Dr. Angelena Form, appointment November 28. CAD, hypertension, hyperlipidemia, sleep apnea.  Intolerant to statins.  Treated with Praluent injections.  Echo September 2018 with normal LV function, EF 55% with no significant valve disease.  Continued on Plavix, beta-blocker, Repatha. Did not tolerate CPAP.  Eval by specialist in 2020. Denies difficulty sleeping. No daytime naps. No somnolence with driving.  Hepatic function panel normal, lipids overall stable on November 28 labs.  1 year follow-up with cardiology.   Ear ringing: Past 6 months.  Both sides.  No pain.  Not noticeable loss of hearing. 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 Usually in office. No new loud noise exposure. No recent hearing test. No ear pain. Notices more at night when quiet.  No treatments. Not sure wants to meet with ENT at this time.   Left foot pain: Past month. NKI.  Sore to twist in outside of foot. Not like prior plantar fasciitis.  Nerve type pain.  Comes and goes, sharp. No ankle or leg pain. No foot numbness.  No treatments.  Has podiatrist - has not discussed area with them.  Been a few years.    Back concern: Seen by derm years ago, Dr. Ronnald Ramp.  no recent visit. No hx of skin CA. Unable to see back - wants to have looked at. No new skin lesions.      09/03/2022    1:48 PM 05/18/2019    8:13 AM 12/03/2017    2:26 PM 10/27/2016   10:11 AM 01/05/2013    4:36  PM  Depression screen PHQ 2/9  Decreased Interest 0 0 0 0 0  Down, Depressed, Hopeless 0 0 0 0 0  PHQ - 2 Score 0 0 0 0 0  Altered sleeping 1      Tired, decreased energy 0      Change in appetite 0      Feeling bad or failure about yourself  0      Trouble concentrating 0      Moving slowly or fidgety/restless 0      Suicidal thoughts 0      PHQ-9 Score 1        Health Maintenance  Topic Date Due   COVID-19 Vaccine (1) 09/19/2022 (Originally 06/12/1961)   Zoster Vaccines- Shingrix (1 of 2) 12/03/2022 (Originally 12/10/2009)   INFLUENZA VACCINE  12/23/2022 (Originally 04/24/2022)   COLONOSCOPY (Pts 45-11yr Insurance coverage will need to be confirmed)  03/31/2026   DTaP/Tdap/Td (2 - Td or Tdap) 10/27/2026   Hepatitis C Screening  Completed   HIV Screening  Completed   HPV VACCINES  Aged Out  Colonoscopy 03/31/16 - repeat 10 years.  Prostate: does not have family history of prostate cancer The natural history of prostate cancer and ongoing controversy regarding screening and potential  treatment outcomes of prostate cancer has been discussed with the patient. The meaning of a false positive PSA and a false negative PSA has been discussed. He indicates understanding of the limitations of this screening test and wishes to proceed with screening PSA testing. Lab Results  Component Value Date   PSA1 0.7 10/27/2016    Immunization History  Administered Date(s) Administered   Influenza Inj Mdck Quad Pf 07/11/2017   Influenza Inj Mdck Quad With Preservative 08/07/2018   Influenza,inj,Quad PF,6+ Mos 05/18/2019   Influenza-Unspecified 08/27/2016, 06/23/2021   Tdap 10/27/2016  Flu vaccine at pharmacy.  Declines covid booster. Had initial vaccine and booster.  Shingles vaccine at pharmacy.   No results found. Wears glasses. Recent appt.   Dental:Within Last 6 months  Alcohol: rare - less than 1 in a month.   Tobacco: none  Exercise/obesity: No regular exercise. Has equipment at  home Wt Readings from Last 3 Encounters:  09/03/22 240 lb 3.2 oz (109 kg)  08/21/22 238 lb 12.8 oz (108.3 kg)  08/15/21 232 lb 12.8 oz (105.6 kg)   Body mass index is 35.47 kg/m.    History Patient Active Problem List   Diagnosis Date Noted   Status post shoulder surgery 10/12/2020   OSA (obstructive sleep apnea) 04/14/2019   Obesity (BMI 30-39.9) 04/14/2019   CAD (coronary artery disease) 12/16/2012   Hyperlipidemia 12/15/2012   ST elevation myocardial infarction (STEMI) of inferior wall ([place]) 12/13/2012   HTN (hypertension) 12/13/2012   Past Medical History:  Diagnosis Date   CAD (coronary artery disease)    a. 11/2012 Acute Inf STEMI/Cath/PCI: LM nl, LAD nl, LCX 30p, OM1 30p, RCA 20p, 11m 95d (3.5x12 promus premier DES), PDA 977m2.31m431messel), RPL 100 (2.25x12 promus premier DES), EF 60%;  b. 11/2012 Echo: EF 55%, mild LVH.   History of kidney stones    HTN (hypertension)    Hyperlipidemia    Obesity (BMI 30-39.9) 04/14/2019   OSA (obstructive sleep apnea) 04/14/2019   severe OSA with an AHI of 33/hr and severe nocturnal hypoxemia with O2 sats as low as 80%   Osteoarthritis of right shoulder    Past Surgical History:  Procedure Laterality Date   CARDIAC CATHETERIZATION     COLONOSCOPY     LEFT HEART CATHETERIZATION WITH CORONARY ANGIOGRAM N/A 12/13/2012   Procedure: LEFT HEART CATHETERIZATION WITH CORONARY ANGIOGRAM;  Surgeon: ChrBurnell BlanksD;  [facility];  Service: Cardiovascular;  Laterality: N/A;   MUSCLE BIOPSY     None     PERCUTANEOUS CORONARY STENT INTERVENTION (PCI-S) N/A 12/13/2012   Procedure: PERCUTANEOUS CORONARY STENT INTERVENTION (PCI-S);  Surgeon: ChrBurnell BlanksD;  [facility];  Service: Cardiovascular;  Laterality: N/A;   SHOULDER ARTHROSCOPY WITH ROTATOR CUFF REPAIR Right 10/12/2020   Procedure: SHOULDER ARTHROSCOPY WITH ROTATOR CUFF REPAIR;  Surgeon: VarHiram GashD;  [place] [facility] [place]: [place]ics;  Laterality: Right;   Allergies  Allergen Reactions   Lisinopril     cough   Other     Mushrooms - GI Problems   Statins     Myalgias    Prior to Admission medications   Medication Sig Start Date End Date Taking? Authorizing Provider  clopidogrel (PLAVIX) 75 MG tablet TAKE 1 TABLET BY MOUTH DAILY 09/29/21  Yes McABurnell BlanksD  Evolocumab (REPATHA SURECLICK) 140361/ML SOAJ INJECT 1 PEN SUBCUTANEOUS EVERY 14 DAILY 07/19/22  Yes McABurnell BlanksD  losartan (COZAAR)  25 MG tablet Take 1 tablet by mouth daily. 06/19/22  Yes Burnell Blanks, MD  metoprolol tartrate (LOPRESSOR) 25 MG tablet Take 1 tablet by mouth twice daily. **Generic equivalent for Lopressor.** 06/07/22  Yes Burnell Blanks, MD  Multiple Vitamin (MULTIVITAMIN WITH MINERALS) TABS tablet Take 1 tablet by mouth daily.   Yes [provider]  nitroGLYCERIN (NITROSTAT) 0.4 MG SL tablet Place 1 tablet (0.4 mg total) under the tongue every 5 (five) minutes as needed for chest pain. 01/10/22  Yes Burnell Blanks, MD  Saccharomyces boulardii (PROBIOTIC) 250 MG CAPS Take by mouth.   Yes [provider]  benzonatate (TESSALON) 100 MG capsule Take 1 capsule (100 mg total) by mouth 3 (three) times daily as needed for cough. Patient not taking: Reported on 09/03/2022 04/10/21   Wendie Agreste, MD   Social History   Socioeconomic History   Marital status: Single    Spouse name: Not on file   Number of children: Not on file   Years of education: Not on file   Highest education level: Not on file  Occupational History   Occupation: Works in [place]: [employer]  Tobacco Use   Smoking status: Never   Smokeless tobacco: Never  Vaping Use   Vaping Use: Never used  Substance and Sexual Activity   Alcohol use: Yes    Comment: social   Drug use: No   Sexual activity: Not on file  Other Topics Concern   Not on file  Social History  Narrative   Not on file   Social Determinants of Health   Financial Resource Strain: Not on file  Food Insecurity: No Food Insecurity (06/08/2022)   Hunger Vital Sign    Worried About Running Out of Food in the Last Year: Never true    Ran Out of Food in the Last Year: Never true  Transportation Needs: No Transportation Needs (06/08/2022)   PRAPARE - Hydrologist (Medical): No    Lack of Transportation (Non-Medical): No  Physical Activity: Not on file  Stress: Not on file  Social Connections: Not on file  Intimate Partner Violence: Not on file    Review of Systems 13 point review of systems per patient health survey noted.  Negative other than as indicated above or in HPI.    Objective:   Vitals:   09/03/22 1350  BP: 136/76  Pulse: 61  Temp: 98.5 F (36.9 C)  SpO2: 96%  Weight: 240 lb 3.2 oz (109 kg)  Height: '5\' 9"'$  (1.753 m)     Physical Exam Vitals reviewed.  Constitutional:      Appearance: He is well-developed.  HENT:     Head: Normocephalic and atraumatic.     Right Ear: External ear normal.     Left Ear: External ear normal.  Eyes:     Conjunctiva/sclera: Conjunctivae normal.     Pupils: Pupils are equal, round, and reactive to light.  Neck:     Thyroid: No thyromegaly.  Cardiovascular:     Rate and Rhythm: Normal rate and regular rhythm.     Heart sounds: Normal heart sounds.  Pulmonary:     Effort: Pulmonary effort is normal. No respiratory distress.     Breath sounds: Normal breath sounds. No wheezing.  Abdominal:     General: There is no distension.     Palpations: Abdomen is soft.     Tenderness: There is no abdominal tenderness.  Musculoskeletal:        General: No tenderness. Normal range of motion.     Cervical back: Normal range of motion and neck supple.     Comments: Left foot/ankle, skin intact, no erythema, ecchymosis or edema.  Malleoli nontender, calcaneus nontender.  Origin of plantar fascia nontender  without defect.  Medial foot nontender.  Fifth metatarsal nontender but describes area of discomfort just proximal to the fifth metatarsal, lateral foot.  Nontender on exam currently.  Pain-free dorsi flexion, plantarflexion, inversion, eversion.  Lymphadenopathy:     Cervical: No cervical adenopathy.  Skin:    General: Skin is warm and dry.     Comments: Multiple dark elevated patches on back - left mid to lower back darker than upper R back. See photo.   Neurological:     Mental Status: He is alert and oriented to person, place, and time.     Deep Tendon Reflexes: Reflexes are normal and symmetric.  Psychiatric:        Behavior: Behavior normal.          Assessment & Plan:  Frederick Baker is a 61 y.o. male . Annual physical exam  - -anticipatory guidance as below in AVS, screening labs above. Health maintenance items as above in HPI discussed/recommended as applicable.   OSA (obstructive sleep apnea)  -Intolerant to CPAP.  Denies persistent daytime somnolence or recent sleep difficulties.  Tinnitus aurium, bilateral  -Bilateral, past 6 months.  Some noise exposure at work but no recent changes and wears hearing protection.  Denies change in hearing.  Recommend ENT, audiology eval initially to evaluate for high-frequency loss, and to discuss meds, potential causes of tinnitus.  Declines referral at this time, okay to place referral when ready.  Handout given.  Background noise, masking noise discussed as initial treatment at night.  Left foot pain  -No known injury, lateral foot pain with certain movements only.  Potentially could have had strain/sprain but overall reassuring exam at present.  No focal bony tenderness.  Discussed option of x-ray, podiatry eval, he will consider calling his podiatrist if persistent symptoms but I am happy to refer him as well or follow-up in office if needed.  [place] - Plan: Ambulatory referral to Dermatology Seborrheic keratoses - Plan: Ambulatory  referral to Dermatology  -Likely multiple seborrheic keratoses but left lower appears darker than others.  Refer to dermatology for evaluation.  Screening for diabetes mellitus - Plan: Basic metabolic panel, Hemoglobin A1c  Screening for prostate cancer - Plan: PSA   No orders of the defined types were placed in this encounter.  Patient Instructions  I would like to refer you to ENT to discuss ringing in the ears and likely hearing test. Background noise may be helpful for now and continue to use hearing protection around noise. Let me know when I can make that referral.   I will refer you to dermatology for the area on the back and check some labs today.  If foot pain does not improve in next 2 weeks, I am happy to order an xray or refer you to podiatry if you would like. Keep me posted.   Tinnitus Tinnitus refers to hearing a sound when there is no actual source for that sound. This is often described as ringing in the ears. However, people with this condition may hear a variety of noises, in one ear or in both ears. The sounds of tinnitus can be soft, loud, or somewhere in between. Tinnitus can  last for a few seconds or can be constant for days. It may go away without treatment and come back at various times. When tinnitus is constant or happens often, it can lead to other problems, such as trouble sleeping and trouble concentrating. Almost everyone experiences tinnitus at some point. Tinnitus is not the same as hearing loss. Tinnitus that is long-lasting (chronic) or comes back often (recurs) may require medical attention. What are the causes? The cause of tinnitus is often not known. In some cases, it can result from: Exposure to loud noises from machinery, music, or other sources. An object (foreign body) stuck in the ear. Earwax buildup. Drinking alcohol or caffeine. Taking certain medicines. Age-related hearing loss. It may also be caused by medical conditions such as: Ear or  sinus infections. Heart diseases or high blood pressure. Allergies. Mnire's disease. Thyroid problems. Tumors. A weak, bulging blood vessel (aneurysm) near the ear. What increases the risk? The following factors may make you more likely to develop this condition: Exposure to loud noises. Age. Tinnitus is more likely in older individuals. Using alcohol or tobacco. What are the signs or symptoms? The main symptom of tinnitus is hearing a sound when there is no source for that sound. It may sound like: Buzzing. Sizzling. Ringing. Blowing air. Hissing. Whistling. Other sounds may include: Roaring. Running water. A musical note. Tapping. Humming. Symptoms may affect only one ear (unilateral) or both ears (bilateral). How is this diagnosed? Tinnitus is diagnosed based on your symptoms, your medical history, and a physical exam. Your health care provider may do a thorough hearing test (audiologic exam) if your tinnitus: Is unilateral. Causes hearing difficulties. Lasts 6 months or longer. You may work with a health care provider who specializes in hearing disorders (audiologist). You may be asked questions about your symptoms and how they affect your daily life. You may have other tests done, such as: CT scan. MRI. An imaging test of how blood flows through your blood vessels (angiogram). How is this treated? Treating an underlying medical condition can sometimes make tinnitus go away. If your tinnitus continues, other treatments may include: Therapy and counseling to help you manage the stress of living with tinnitus. Sound generators to mask the tinnitus. These include: Tabletop sound machines that play relaxing sounds to help you fall asleep. Wearable devices that fit in your ear and play sounds or music. Acoustic neural stimulation. This involves using headphones to listen to music that contains an auditory signal. Over time, listening to this signal may change some pathways  in your brain and make you less sensitive to tinnitus. This treatment is used for very severe cases when no other treatment is working. Using hearing aids or cochlear implants if your tinnitus is related to hearing loss. Hearing aids are worn in the outer ear. Cochlear implants are surgically placed in the inner ear. Follow these instructions at home: Managing symptoms     When possible, avoid being in loud places and being exposed to loud sounds. Wear hearing protection, such as earplugs, when you are exposed to loud noises. Use a white noise machine, a humidifier, or other devices to mask the sound of tinnitus. Practice techniques for reducing stress, such as meditation, yoga, or deep breathing. Work with your health care provider if you need help with managing stress. Sleep with your head slightly raised. This may reduce the impact of tinnitus. General instructions Do not use stimulants, such as nicotine, alcohol, or caffeine. Talk with your health care provider  about other stimulants to avoid. Stimulants are substances that can make you feel alert and attentive by increasing certain activities in the body (such as heart rate and blood pressure). These substances may make tinnitus worse. Take over-the-counter and prescription medicines only as told by your health care provider. Try to get plenty of sleep each night. Keep all follow-up visits. This is important. Contact a health care provider if: Your tinnitus continues for 3 weeks or longer without stopping. You develop sudden hearing loss. Your symptoms get worse or do not get better with home care. You feel you are not able to manage the stress of living with tinnitus. Get help right away if: You develop tinnitus after a head injury. You have tinnitus along with any of the following: Dizziness. Nausea and vomiting. Loss of balance. Sudden, severe headache. Vision changes. Facial weakness or weakness of arms or legs. These symptoms  may represent a serious problem that is an emergency. Do not wait to see if the symptoms will go away. Get medical help right away. Call your local emergency services (911 in the [place] Do not drive yourself to the hospital. Summary Tinnitus refers to hearing a sound when there is no actual source for that sound. This is often described as ringing in the ears. Symptoms may affect only one ear (unilateral) or both ears (bilateral). Use a white noise machine, a humidifier, or other devices to mask the sound of tinnitus. Do not use stimulants, such as nicotine, alcohol, or caffeine. These substances may make tinnitus worse. This information is not intended to replace advice given to you by your health care provider. Make sure you discuss any questions you have with your health care provider. Document Revised: 08/15/2020 Document Reviewed: 08/15/2020 Elsevier Patient Education  2023 Elsevier Inc.   Preventive Care 83-75 Years Old, Male Preventive care refers to lifestyle choices and visits with your health care provider that can promote health and wellness. Preventive care visits are also called wellness exams. What can I expect for my preventive care visit? Counseling During your preventive care visit, your health care provider may ask about your: Medical history, including: Past medical problems. Family medical history. Current health, including: Emotional well-being. Home life and relationship well-being. Sexual activity. Lifestyle, including: Alcohol, nicotine or tobacco, and drug use. Access to firearms. Diet, exercise, and sleep habits. Safety issues such as seatbelt and bike helmet use. Sunscreen use. Work and work Statistician. Physical exam Your health care provider will check your: Height and weight. These may be used to calculate your BMI (body mass index). BMI is a measurement that tells if you are at a healthy weight. Waist circumference. This measures the distance around  your waistline. This measurement also tells if you are at a healthy weight and may help predict your risk of certain diseases, such as type 2 diabetes and high blood pressure. Heart rate and blood pressure. Body temperature. Skin for abnormal spots. What immunizations do I need?  Vaccines are usually given at various ages, according to a schedule. Your health care provider will recommend vaccines for you based on your age, medical history, and lifestyle or other factors, such as travel or where you work. What tests do I need? Screening Your health care provider may recommend screening tests for certain conditions. This may include: Lipid and cholesterol levels. Diabetes screening. This is done by checking your blood sugar (glucose) after you have not eaten for a while (fasting). Hepatitis B test. Hepatitis C test. HIV (human immunodeficiency  virus) test. STI (sexually transmitted infection) testing, if you are at risk. Lung cancer screening. Prostate cancer screening. Colorectal cancer screening. Talk with your health care provider about your test results, treatment options, and if necessary, the need for more tests. Follow these instructions at home: Eating and drinking  Eat a diet that includes fresh fruits and vegetables, whole grains, lean protein, and low-fat dairy products. Take vitamin and mineral supplements as recommended by your health care provider. Do not drink alcohol if your health care provider tells you not to drink. If you drink alcohol: Limit how much you have to 0-2 drinks a day. Know how much alcohol is in your drink. In the [place] one drink equals one 12 oz bottle of beer (355 mL), one 5 oz glass of wine (148 mL), or one 1 oz glass of hard liquor (44 mL). Lifestyle Brush your teeth every morning and night with fluoride toothpaste. Floss one time each day. Exercise for at least 30 minutes 5 or more days each week. Do not use any products that contain nicotine or  tobacco. These products include cigarettes, chewing tobacco, and vaping devices, such as e-cigarettes. If you need help quitting, ask your health care provider. Do not use drugs. If you are sexually active, practice safe sex. Use a condom or other form of protection to prevent STIs. Take aspirin only as told by your health care provider. Make sure that you understand how much to take and what form to take. Work with your health care provider to find out whether it is safe and beneficial for you to take aspirin daily. Find healthy ways to manage stress, such as: Meditation, yoga, or listening to music. Journaling. Talking to a trusted person. Spending time with friends and family. Minimize exposure to UV radiation to reduce your risk of skin cancer. Safety Always wear your seat belt while driving or riding in a vehicle. Do not drive: If you have been drinking alcohol. Do not ride with someone who has been drinking. When you are tired or distracted. While texting. If you have been using any mind-altering substances or drugs. Wear a helmet and other protective equipment during sports activities. If you have firearms in your house, make sure you follow all gun safety procedures. What's next? Go to your health care provider once a year for an annual wellness visit. Ask your health care provider how often you should have your eyes and teeth checked. Stay up to date on all vaccines. This information is not intended to replace advice given to you by your health care provider. Make sure you discuss any questions you have with your health care provider. Document Revised: 03/08/2021 Document Reviewed: 03/08/2021 Elsevier Patient Education  [place],   Merri Ray, MD [place], [facility] 09/03/22 2:23 PM

## 2022-09-03 NOTE — Patient Instructions (Addendum)
I would like to refer you to ENT to discuss ringing in the ears and likely hearing test. Background noise may be helpful for now and continue to use hearing protection around noise. Let me know when I can make that referral.   I will refer you to dermatology for the area on the back and check some labs today.  If foot pain does not improve in next 2 weeks, I am happy to order an xray or refer you to podiatry if you would like. Keep me posted.   Tinnitus Tinnitus refers to hearing a sound when there is no actual source for that sound. This is often described as ringing in the ears. However, people with this condition may hear a variety of noises, in one ear or in both ears. The sounds of tinnitus can be soft, loud, or somewhere in between. Tinnitus can last for a few seconds or can be constant for days. It may go away without treatment and come back at various times. When tinnitus is constant or happens often, it can lead to other problems, such as trouble sleeping and trouble concentrating. Almost everyone experiences tinnitus at some point. Tinnitus is not the same as hearing loss. Tinnitus that is long-lasting (chronic) or comes back often (recurs) may require medical attention. What are the causes? The cause of tinnitus is often not known. In some cases, it can result from: Exposure to loud noises from machinery, music, or other sources. An object (foreign body) stuck in the ear. Earwax buildup. Drinking alcohol or caffeine. Taking certain medicines. Age-related hearing loss. It may also be caused by medical conditions such as: Ear or sinus infections. Heart diseases or high blood pressure. Allergies. Mnire's disease. Thyroid problems. Tumors. A weak, bulging blood vessel (aneurysm) near the ear. What increases the risk? The following factors may make you more likely to develop this condition: Exposure to loud noises. Age. Tinnitus is more likely in older individuals. Using alcohol  or tobacco. What are the signs or symptoms? The main symptom of tinnitus is hearing a sound when there is no source for that sound. It may sound like: Buzzing. Sizzling. Ringing. Blowing air. Hissing. Whistling. Other sounds may include: Roaring. Running water. A musical note. Tapping. Humming. Symptoms may affect only one ear (unilateral) or both ears (bilateral). How is this diagnosed? Tinnitus is diagnosed based on your symptoms, your medical history, and a physical exam. Your health care provider may do a thorough hearing test (audiologic exam) if your tinnitus: Is unilateral. Causes hearing difficulties. Lasts 6 months or longer. You may work with a health care provider who specializes in hearing disorders (audiologist). You may be asked questions about your symptoms and how they affect your daily life. You may have other tests done, such as: CT scan. MRI. An imaging test of how blood flows through your blood vessels (angiogram). How is this treated? Treating an underlying medical condition can sometimes make tinnitus go away. If your tinnitus continues, other treatments may include: Therapy and counseling to help you manage the stress of living with tinnitus. Sound generators to mask the tinnitus. These include: Tabletop sound machines that play relaxing sounds to help you fall asleep. Wearable devices that fit in your ear and play sounds or music. Acoustic neural stimulation. This involves using headphones to listen to music that contains an auditory signal. Over time, listening to this signal may change some pathways in your brain and make you less sensitive to tinnitus. This treatment is used for  very severe cases when no other treatment is working. Using hearing aids or cochlear implants if your tinnitus is related to hearing loss. Hearing aids are worn in the outer ear. Cochlear implants are surgically placed in the inner ear. Follow these instructions at home: Managing  symptoms     When possible, avoid being in loud places and being exposed to loud sounds. Wear hearing protection, such as earplugs, when you are exposed to loud noises. Use a white noise machine, a humidifier, or other devices to mask the sound of tinnitus. Practice techniques for reducing stress, such as meditation, yoga, or deep breathing. Work with your health care provider if you need help with managing stress. Sleep with your head slightly raised. This may reduce the impact of tinnitus. General instructions Do not use stimulants, such as nicotine, alcohol, or caffeine. Talk with your health care provider about other stimulants to avoid. Stimulants are substances that can make you feel alert and attentive by increasing certain activities in the body (such as heart rate and blood pressure). These substances may make tinnitus worse. Take over-the-counter and prescription medicines only as told by your health care provider. Try to get plenty of sleep each night. Keep all follow-up visits. This is important. Contact a health care provider if: Your tinnitus continues for 3 weeks or longer without stopping. You develop sudden hearing loss. Your symptoms get worse or do not get better with home care. You feel you are not able to manage the stress of living with tinnitus. Get help right away if: You develop tinnitus after a head injury. You have tinnitus along with any of the following: Dizziness. Nausea and vomiting. Loss of balance. Sudden, severe headache. Vision changes. Facial weakness or weakness of arms or legs. These symptoms may represent a serious problem that is an emergency. Do not wait to see if the symptoms will go away. Get medical help right away. Call your local emergency services (911 in the [place] Do not drive yourself to the hospital. Summary Tinnitus refers to hearing a sound when there is no actual source for that sound. This is often described as ringing in the  ears. Symptoms may affect only one ear (unilateral) or both ears (bilateral). Use a white noise machine, a humidifier, or other devices to mask the sound of tinnitus. Do not use stimulants, such as nicotine, alcohol, or caffeine. These substances may make tinnitus worse. This information is not intended to replace advice given to you by your health care provider. Make sure you discuss any questions you have with your health care provider. Document Revised: 08/15/2020 Document Reviewed: 08/15/2020 Elsevier Patient Education  2023 Elsevier Inc.   Preventive Care 24-58 Years Old, Male Preventive care refers to lifestyle choices and visits with your health care provider that can promote health and wellness. Preventive care visits are also called wellness exams. What can I expect for my preventive care visit? Counseling During your preventive care visit, your health care provider may ask about your: Medical history, including: Past medical problems. Family medical history. Current health, including: Emotional well-being. Home life and relationship well-being. Sexual activity. Lifestyle, including: Alcohol, nicotine or tobacco, and drug use. Access to firearms. Diet, exercise, and sleep habits. Safety issues such as seatbelt and bike helmet use. Sunscreen use. Work and work Statistician. Physical exam Your health care provider will check your: Height and weight. These may be used to calculate your BMI (body mass index). BMI is a measurement that tells if you are at  a healthy weight. Waist circumference. This measures the distance around your waistline. This measurement also tells if you are at a healthy weight and may help predict your risk of certain diseases, such as type 2 diabetes and high blood pressure. Heart rate and blood pressure. Body temperature. Skin for abnormal spots. What immunizations do I need?  Vaccines are usually given at various ages, according to a schedule. Your  health care provider will recommend vaccines for you based on your age, medical history, and lifestyle or other factors, such as travel or where you work. What tests do I need? Screening Your health care provider may recommend screening tests for certain conditions. This may include: Lipid and cholesterol levels. Diabetes screening. This is done by checking your blood sugar (glucose) after you have not eaten for a while (fasting). Hepatitis B test. Hepatitis C test. HIV (human immunodeficiency virus) test. STI (sexually transmitted infection) testing, if you are at risk. Lung cancer screening. Prostate cancer screening. Colorectal cancer screening. Talk with your health care provider about your test results, treatment options, and if necessary, the need for more tests. Follow these instructions at home: Eating and drinking  Eat a diet that includes fresh fruits and vegetables, whole grains, lean protein, and low-fat dairy products. Take vitamin and mineral supplements as recommended by your health care provider. Do not drink alcohol if your health care provider tells you not to drink. If you drink alcohol: Limit how much you have to 0-2 drinks a day. Know how much alcohol is in your drink. In the [place] one drink equals one 12 oz bottle of beer (355 mL), one 5 oz glass of wine (148 mL), or one 1 oz glass of hard liquor (44 mL). Lifestyle Brush your teeth every morning and night with fluoride toothpaste. Floss one time each day. Exercise for at least 30 minutes 5 or more days each week. Do not use any products that contain nicotine or tobacco. These products include cigarettes, chewing tobacco, and vaping devices, such as e-cigarettes. If you need help quitting, ask your health care provider. Do not use drugs. If you are sexually active, practice safe sex. Use a condom or other form of protection to prevent STIs. Take aspirin only as told by your health care provider. Make sure that you  understand how much to take and what form to take. Work with your health care provider to find out whether it is safe and beneficial for you to take aspirin daily. Find healthy ways to manage stress, such as: Meditation, yoga, or listening to music. Journaling. Talking to a trusted person. Spending time with friends and family. Minimize exposure to UV radiation to reduce your risk of skin cancer. Safety Always wear your seat belt while driving or riding in a vehicle. Do not drive: If you have been drinking alcohol. Do not ride with someone who has been drinking. When you are tired or distracted. While texting. If you have been using any mind-altering substances or drugs. Wear a helmet and other protective equipment during sports activities. If you have firearms in your house, make sure you follow all gun safety procedures. What's next? Go to your health care provider once a year for an annual wellness visit. Ask your health care provider how often you should have your eyes and teeth checked. Stay up to date on all vaccines. This information is not intended to replace advice given to you by your health care provider. Make sure you discuss any questions you have  with your health care provider. Document Revised: 03/08/2021 Document Reviewed: 03/08/2021 Elsevier Patient Education  2023 Elsevier Inc.  

## 2022-09-04 ENCOUNTER — Encounter: Payer: Self-pay | Admitting: Family Medicine

## 2022-09-04 ENCOUNTER — Other Ambulatory Visit: Payer: Self-pay | Admitting: Cardiovascular Disease

## 2022-09-04 LAB — BASIC METABOLIC PANEL
BUN: 14 mg/dL (ref 6–23)
CO2: 28 mEq/L (ref 19–32)
Calcium: 9.5 mg/dL (ref 8.4–10.5)
Chloride: 102 mEq/L (ref 96–112)
Creatinine, Ser: 1.01 mg/dL (ref 0.40–1.50)
GFR: 79.58 mL/min (ref >60.00)
Glucose, Bld: 82 mg/dL (ref 70–99)
Potassium: 4.6 mEq/L (ref 3.5–5.1)
Sodium: 140 mEq/L (ref 135–145)

## 2022-09-04 LAB — HEMOGLOBIN A1C: Hgb A1c MFr Bld: 5.3 % (ref 4.6–6.5)

## 2022-09-04 LAB — PSA: PSA: 0.78 ng/mL (ref 0.10–4.00)

## 2022-09-15 DIAGNOSIS — J189 Pneumonia, unspecified organism: Secondary | ICD-10-CM | POA: Diagnosis not present

## 2022-09-15 DIAGNOSIS — J069 Acute upper respiratory infection, unspecified: Secondary | ICD-10-CM | POA: Diagnosis not present

## 2022-09-15 DIAGNOSIS — J209 Acute bronchitis, unspecified: Secondary | ICD-10-CM | POA: Diagnosis not present

## 2022-09-15 DIAGNOSIS — E8779 Other fluid overload: Secondary | ICD-10-CM | POA: Diagnosis not present

## 2022-09-21 DIAGNOSIS — J209 Acute bronchitis, unspecified: Secondary | ICD-10-CM | POA: Diagnosis not present

## 2022-09-21 DIAGNOSIS — J069 Acute upper respiratory infection, unspecified: Secondary | ICD-10-CM | POA: Diagnosis not present

## 2022-09-21 DIAGNOSIS — J189 Pneumonia, unspecified organism: Secondary | ICD-10-CM | POA: Diagnosis not present

## 2022-09-24 ENCOUNTER — Other Ambulatory Visit: Payer: Self-pay | Admitting: Cardiovascular Disease

## 2022-09-27 ENCOUNTER — Encounter: Payer: BC Managed Care – PPO | Admitting: Family Medicine

## 2022-10-16 DIAGNOSIS — L821 Other seborrheic keratosis: Secondary | ICD-10-CM | POA: Diagnosis not present

## 2022-10-16 DIAGNOSIS — L814 Other melanin hyperpigmentation: Secondary | ICD-10-CM | POA: Diagnosis not present

## 2022-10-16 DIAGNOSIS — D1801 Hemangioma of skin and subcutaneous tissue: Secondary | ICD-10-CM | POA: Diagnosis not present

## 2022-10-16 DIAGNOSIS — L570 Actinic keratosis: Secondary | ICD-10-CM | POA: Diagnosis not present

## 2022-10-22 ENCOUNTER — Ambulatory Visit: Payer: BC Managed Care – PPO | Admitting: Cardiovascular Disease

## 2023-05-06 ENCOUNTER — Other Ambulatory Visit: Payer: Self-pay | Admitting: Cardiovascular Disease

## 2023-05-07 ENCOUNTER — Telehealth: Payer: Self-pay | Admitting: Cardiology

## 2023-05-07 DIAGNOSIS — G4733 Obstructive sleep apnea (adult) (pediatric): Secondary | ICD-10-CM

## 2023-05-07 DIAGNOSIS — I10 Essential (primary) hypertension: Secondary | ICD-10-CM

## 2023-05-07 DIAGNOSIS — I2510 Atherosclerotic heart disease of native coronary artery without angina pectoris: Secondary | ICD-10-CM

## 2023-05-07 NOTE — Telephone Encounter (Signed)
Spoke to patient about Dr. Norris Cross recommendation that oral appliance may not be effective with severe sleep apnea. Advised that patient would likely still have to use cpap with oral appliance and then would need repeat sleep study. Patient states he has not used cpap in over a year as he could not find any mask that he could comfortably wear. Patient states his regular dentist is also recommending oral appliance as she thinks he grinding his teeth and causing damage to teeth and gums. Forwarded patient's responses to Dr. Mayford Knife.

## 2023-05-07 NOTE — Telephone Encounter (Signed)
Referral placed for oral sleep appliance.

## 2023-05-07 NOTE — Telephone Encounter (Signed)
Patient wants to get orders to get a mouth piece from his dentist for his sleep apnea.

## 2023-05-07 NOTE — Addendum Note (Signed)
Addended by: Luellen Pucker on: 05/07/2023 03:25 PM   Modules accepted: Orders

## 2023-05-22 ENCOUNTER — Telehealth: Payer: Self-pay | Admitting: Cardiovascular Disease

## 2023-05-22 MED ORDER — REPATHA SURECLICK 140 MG/ML ~~LOC~~ SOAJ: SUBCUTANEOUS | 11 refills | Status: DC

## 2023-05-22 NOTE — Telephone Encounter (Signed)
Refill sent in

## 2023-05-22 NOTE — Telephone Encounter (Signed)
*  STAT* If patient is at the pharmacy, call can be transferred to refill team.   1. Which medications need to be refilled? (please list name of each medication and dose if known)   Evolocumab (REPATHA SURECLICK) 140 MG/ML SOAJ   2. Would you like to learn more about the convenience, safety, & potential cost savings by using the [facility]?   3. Are you open to using the [facility] (Type [facility]. ).  4. Which pharmacy/location (including street and city if local pharmacy) is medication to be sent to?  [id number] - [place], [place] - [address]   5. Do they need a 30 day or 90 day supply?   30 day  Patient stated he still has some of this medication.

## 2023-05-22 NOTE — Telephone Encounter (Signed)
Patient stated Dr. Henrietta Hoover office told him they will need a referral for him to be seen.

## 2023-06-03 NOTE — Telephone Encounter (Signed)
Gave patient recommendations made by Dr. Mayford Knife and  understanding was verbalized. Patient is thinking about getting the oral device but he has not completely made up his mind. He will contact our office if he gets it to arrange the sleep study.

## 2023-07-17 ENCOUNTER — Encounter: Payer: Self-pay | Admitting: Cardiology

## 2023-08-15 ENCOUNTER — Other Ambulatory Visit (HOSPITAL_COMMUNITY): Payer: Self-pay

## 2023-08-15 ENCOUNTER — Telehealth: Payer: Self-pay | Admitting: Pharmacy Technician

## 2023-08-15 ENCOUNTER — Other Ambulatory Visit: Payer: Self-pay | Admitting: Cardiovascular Disease

## 2023-08-15 NOTE — Telephone Encounter (Signed)
Pharmacy Patient Advocate Encounter   Received notification from CoverMyMeds that prior authorization for repatha is required/requested.   Insurance verification completed.   The patient is insured through [facility] .   Per test claim: PA required; PA submitted to above mentioned insurance via CoverMyMeds Key/confirmation #/[id number] Status is pending

## 2023-08-16 NOTE — Telephone Encounter (Signed)
Pharmacy Patient Advocate Encounter  Received notification from [facility] that Prior Authorization for repatha has been APPROVED from 08/15/23 to 08/14/24   PA #/Case ID/Reference #: [id number]

## 2023-08-28 ENCOUNTER — Other Ambulatory Visit: Payer: Self-pay | Admitting: Cardiovascular Disease

## 2023-09-05 ENCOUNTER — Encounter: Payer: BC Managed Care – PPO | Admitting: Family Medicine

## 2023-09-12 NOTE — Progress Notes (Signed)
Chief Complaint  Patient presents with   Follow-up    CAD   History of Present Illness: 62 yo male with history of CAD, HTN, HLD and sleep apnea who is here today for followup. He was admitted to [facility] in 2014 with an inferior STEMI. The RCA was occluded distally. I placed a drug eluting stent in the posterolateral branch and a drug eluting stent in the distal RCA. Echo 12/15/12 with LVEF=55% post inferior MI with inferobasal hypokinesis. He has not tolerated statins in the past despite many attempts in primary care with many different meds and different dosages. He is on Praluent injections. Nuclear stress test in 2016 showed no ischemia. Echo September 2018 with normal LV function, LVEF=55%, no significant valve disease.   He is here today for follow up. The patient denies any chest pain, dyspnea, palpitations, lower extremity edema, orthopnea, PND, dizziness, near syncope or syncope.   Primary Care Physician: Shade Flood, MD   Past Medical History:  Diagnosis Date   CAD (coronary artery disease)    a. 11/2012 Acute Inf STEMI/Cath/PCI: LM nl, LAD nl, LCX 30p, OM1 30p, RCA 20p, 20m, 95d (3.5x12 promus premier DES), PDA 35m (2.33mm vessel), RPL 100 (2.25x12 promus premier DES), EF 60%;  b. 11/2012 Echo: EF 55%, mild LVH.   History of kidney stones    HTN (hypertension)    Hyperlipidemia    Obesity (BMI 30-39.9) 04/14/2019   OSA (obstructive sleep apnea) 04/14/2019   severe OSA with an AHI of 33/hr and severe nocturnal hypoxemia with O2 sats as low as 80%   Osteoarthritis of right shoulder     Past Surgical History:  Procedure Laterality Date   CARDIAC CATHETERIZATION     COLONOSCOPY     LEFT HEART CATHETERIZATION WITH CORONARY ANGIOGRAM N/A 12/13/2012   Procedure: LEFT HEART CATHETERIZATION WITH CORONARY ANGIOGRAM;  Surgeon: Kathleene Hazel, MD;  Location: [facility] CATH LAB;  Service: Cardiovascular;  Laterality: N/A;   MUSCLE BIOPSY     None     PERCUTANEOUS CORONARY STENT  INTERVENTION (PCI-S) N/A 12/13/2012   Procedure: PERCUTANEOUS CORONARY STENT INTERVENTION (PCI-S);  Surgeon: Kathleene Hazel, MD;  Location[facility] C[facility] LAB;  Service: Cardiovascular;  Laterality: N/A;   SHOULDER ARTHROSCOPY WITH ROTATOR CUFF REPAIR Right 10/12/2020   Procedure: SHOULDER ARTHROSCOPY WITH ROTATOR CUFF REPAIR;  Surgeon: Bjorn Pippin, MD;  Location: [facility];  Service: Orthopedics;  Laterality: Right;    Current Outpatient Medications  Medication Sig Dispense Refill   clopidogrel (PLAVIX) 75 MG tablet Take 1 tablet by mouth daily. 90 tablet 0   Evolocumab (REPATHA SURECLICK) 140 MG/ML SOAJ INJECT 1 PEN SUBCUTANEOUS EVERY 14 DAILY 2 mL 11   losartan (COZAAR) 25 MG tablet Take 1 tablet by mouth daily. 90 tablet 1   metoprolol tartrate (LOPRESSOR) 25 MG tablet Take 1 tablet by mouth twice daily. Generic equivalent for Lopressor. 60 tablet 0   Multiple Vitamin (MULTIVITAMIN WITH MINERALS) TABS tablet Take 1 tablet by mouth daily.     Saccharomyces boulardii (PROBIOTIC) 250 MG CAPS Take by mouth.     nitroGLYCERIN (NITROSTAT) 0.4 MG SL tablet Place 1 tablet (0.4 mg total) under the tongue every 5 (five) minutes as needed for chest pain. 25 tablet 1   No current facility-administered medications for this visit.    Allergies  Allergen Reactions   Lisinopril     cough   Other     Mushrooms - GI Problems   Statins  Myalgias     Social History   Socioeconomic History   Marital status: Single    Spouse name: Not on file   Number of children: Not on file   Years of education: Not on file   Highest education level: Not on file  Occupational History   Occupation: Works in Musician: [employer]  Tobacco Use   Smoking status: Never   Smokeless tobacco: Never  Vaping Use   Vaping status: Never Used  Substance and Sexual Activity   Alcohol use: Yes    Comment: social   Drug use: No   Sexual activity: Not on file  Other Topics Concern    Not on file  Social History Narrative   Not on file   Social Drivers of Health   Financial Resource Strain: Not on file  Food Insecurity: No Food Insecurity (06/08/2022)   Hunger Vital Sign    Worried About Running Out of Food in the Last Year: Never true    Ran Out of Food in the Last Year: Never true  Transportation Needs: No Transportation Needs (06/08/2022)   PRAPARE - Administrator, Civil Service (Medical): No    Lack of Transportation (Non-Medical): No  Physical Activity: Not on file  Stress: Not on file  Social Connections: Not on file  Intimate Partner Violence: Not on file    Family History  Problem Relation Age of Onset   Stroke Mother    Hypertension Mother    Hypertension Father    CAD Paternal Uncle    Cancer Brother     Review of Systems:  As stated in the HPI and otherwise negative.   BP (!) 140/82 (BP Location: Left Arm, Patient Position: Sitting)   Pulse 68   Ht 5\' 9"  (1.753 m)   Wt 110.4 kg   SpO2 96%   BMI 35.94 kg/m   Physical Examination: General: Well developed, well nourished, NAD  HEENT: OP clear, mucus membranes moist  SKIN: warm, dry. No rashes. Neuro: No focal deficits  Musculoskeletal: Muscle strength 5/5 all ext  Psychiatric: Mood and affect normal  Neck: No JVD, no carotid bruits, no thyromegaly, no lymphadenopathy.  Lungs:Clear bilaterally, no wheezes, rhonci, crackles Cardiovascular: Regular rate and rhythm. No murmurs, gallops or rubs. Abdomen:Soft. Bowel sounds present. Non-tender.  Extremities: No lower extremity edema. Pulses are 2 + in the bilateral DP/PT.  EKG:  EKG is ordered today. The ekg ordered today demonstrates  EKG Interpretation Date/Time:  Friday September 13 2023 16:35:45 EST Ventricular Rate:  72 PR Interval:  150 QRS Duration:  92 QT Interval:  384 QTC Calculation: 420 R Axis:   -32  Text Interpretation: Sinus rhythm with occasional Premature ventricular complexes Left axis deviation  Inferior infarct (cited on or before 21-Jan-2003) When compared with ECG of 15-Dec-2012 07:13, Premature ventricular complexes are now Present Confirmed by Verne Carrow [phone]) on 09/13/2023 4:40:50 PM    Recent Labs: No results found for requested labs within last 365 days.   Lipid Panel    Component Value Date/Time   CHOL 148 08/21/2022 0920   TRIG 188 (H) 08/21/2022 0920   HDL 39 (L) 08/21/2022 0920   CHOLHDL 3.8 08/21/2022 0920   CHOLHDL 2.9 03/29/2016 0752   VLDL 27 03/29/2016 0752   LDLCALC 77 08/21/2022 0920     Wt Readings from Last 3 Encounters:  09/13/23 110.4 kg  09/03/22 109 kg  08/21/22 108.3 kg    Assessment and  Plan:   1. CAD without angina: No chest pain suggestive of angina. Continue Plavix, beta blocker and Repatha. He is intolerant of statins.    2. HTN: BP is well controlled. No changes  3. HLD: LDL near goal in November 4132. Continue Repatha. Repeat lipids and LFTs in primary care next month.   4. Sleep apnea: He has had a sleep study and had a visit with Dr. Mayford Knife in July 2020. He did not tolerate CPAP.    Labs/ tests ordered today include:   Orders Placed This Encounter  Procedures   EKG 12-Lead   Disposition:   F/U with me in 12  months  Signed, Verne Carrow, MD 09/13/2023 4:54 PM    [facility] [address] [place], [place], [place]  [address] Phone: [phone]; Fax: [phone]

## 2023-09-13 ENCOUNTER — Encounter: Payer: Self-pay | Admitting: Cardiovascular Disease

## 2023-09-13 ENCOUNTER — Ambulatory Visit: Payer: BLUE CROSS/BLUE SHIELD | Attending: Cardiovascular Disease | Admitting: Cardiovascular Disease

## 2023-09-13 VITALS — BP 140/82 | HR 68 | Ht 69.0 in | Wt 243.4 lb

## 2023-09-13 DIAGNOSIS — G4733 Obstructive sleep apnea (adult) (pediatric): Secondary | ICD-10-CM

## 2023-09-13 DIAGNOSIS — I2510 Atherosclerotic heart disease of native coronary artery without angina pectoris: Secondary | ICD-10-CM | POA: Diagnosis not present

## 2023-09-13 DIAGNOSIS — I10 Essential (primary) hypertension: Secondary | ICD-10-CM | POA: Diagnosis not present

## 2023-09-13 DIAGNOSIS — E785 Hyperlipidemia, unspecified: Secondary | ICD-10-CM | POA: Diagnosis not present

## 2023-09-13 MED ORDER — NITROGLYCERIN 0.4 MG SL SUBL: 0.4000 mg | SUBLINGUAL_TABLET | SUBLINGUAL | 1 refills | Status: AC | PRN

## 2023-09-13 NOTE — Patient Instructions (Addendum)
Medication Instructions:  No changes today *If you need a refill on your cardiac medications before your next appointment, please call your pharmacy*   Lab Work: None today If you have labs (blood work) drawn today and your tests are completely normal, you will receive your results only by: MyChart Message (if you have MyChart) OR A paper copy in the mail If you have any lab test that is abnormal or we need to change your treatment, we will call you to review the results.   Testing/Procedures: none   Follow-Up: At [facility], you and your health needs are our priority.  As part of our continuing mission to provide you with exceptional heart care, we have created designated Provider Care Teams.  These Care Teams include your primary Cardiologist (physician) and Advanced Practice Providers (APPs -  Physician Assistants and Nurse Practitioners) who all work together to provide you with the care you need, when you need it.   Your next appointment:   12 month(s)  Provider:   Verne Carrow, MD     Other Instructions

## 2023-09-16 ENCOUNTER — Other Ambulatory Visit: Payer: Self-pay | Admitting: Cardiovascular Disease

## 2023-09-30 ENCOUNTER — Ambulatory Visit: Payer: BLUE CROSS/BLUE SHIELD | Admitting: Family Medicine

## 2023-09-30 VITALS — BP 128/76 | HR 76 | Temp 98.7°F | Ht 69.0 in | Wt 244.8 lb

## 2023-09-30 DIAGNOSIS — E785 Hyperlipidemia, unspecified: Secondary | ICD-10-CM | POA: Diagnosis not present

## 2023-09-30 DIAGNOSIS — Z125 Encounter for screening for malignant neoplasm of prostate: Secondary | ICD-10-CM | POA: Diagnosis not present

## 2023-09-30 DIAGNOSIS — Z131 Encounter for screening for diabetes mellitus: Secondary | ICD-10-CM | POA: Diagnosis not present

## 2023-09-30 DIAGNOSIS — Z0000 Encounter for general adult medical examination without abnormal findings: Secondary | ICD-10-CM | POA: Diagnosis not present

## 2023-09-30 DIAGNOSIS — I10 Essential (primary) hypertension: Secondary | ICD-10-CM

## 2023-09-30 NOTE — Progress Notes (Signed)
 Subjective:  Patient ID: Frederick Baker, male    DOB: 10-24-1960  Age: 63 y.o. MRN: [id number]  CC:  Chief Complaint  Patient presents with   Annual Exam    Pt is well, no concerns, not fasting     HPI Frederick Baker presents for Annual Exam  PCP, me Cardiology, Dr. Verlin, HTN, HLD, OSA - unable to tolerate CPAP. On Repatha , losartan , lopressor , plavix .  No new med side effects.  Cardiology appt in 08/2023 - no changes.  Dermatology - Dr. Joshua at [facility]. Derm eval about a year ago.   Less active - some weight gain with decreased exercise/activity - plans to increase.  Still active with work. [employer] - more time in office.       09/30/2023    3:11 PM 09/03/2022    1:48 PM 05/18/2019    8:13 AM 12/03/2017    2:26 PM 10/27/2016   10:11 AM  Depression screen PHQ 2/9  Decreased Interest 0 0 0 0 0  Down, Depressed, Hopeless 0 0 0 0 0  PHQ - 2 Score 0 0 0 0 0  Altered sleeping 1 1     Tired, decreased energy  0     Change in appetite 0 0     Feeling bad or failure about yourself  0 0     Trouble concentrating 0 0     Moving slowly or fidgety/restless 0 0     Suicidal thoughts 0 0     PHQ-9 Score 1 1       Health Maintenance  Topic Date Due   COVID-19 Vaccine (1) Never done   Colonoscopy  03/31/2026   DTaP/Tdap/Td (2 - Td or Tdap) 10/27/2026   INFLUENZA VACCINE  Completed   Hepatitis C Screening  Completed   HIV Screening  Completed   Zoster Vaccines- Shingrix  Completed   HPV VACCINES  Aged Out  Colonoscopy 03/31/16 - repeat 10 years.  Prostate: does not have family history of prostate cancer The natural history of prostate cancer and ongoing controversy regarding screening and potential treatment outcomes of prostate cancer has been discussed with the patient. The meaning of a false positive PSA and a false negative PSA has been discussed. He indicates understanding of the limitations of this screening test and wishes to proceed with  screening PSA testing.  Lab Results  Component Value Date   PSA1 0.7 10/27/2016   PSA 0.78 09/03/2022   Immunization History  Administered Date(s) Administered   Influenza Inj Mdck Quad Pf 07/11/2017   Influenza Inj Mdck Quad With Preservative 08/07/2018   Influenza,inj,Quad PF,6+ Mos 05/18/2019   Influenza-Unspecified 08/27/2016, 06/23/2021, 07/13/2022, 05/31/2023   Tdap 10/27/2016   Zoster Recombinant(Shingrix) 03/31/2021, 07/07/2021  Flu vaccine 05/2023 at pharmacy.  Covid booster - declines  No results found. Optho - appt soon - yearly  Dental: every 6 months.   Alcohol: none  Tobacco: none  Exercise: as above - plans to increase exercise.   Not fasting today.   History Patient Active Problem List   Diagnosis Date Noted   Status post shoulder surgery 10/12/2020   OSA (obstructive sleep apnea) 04/14/2019   Obesity (BMI 30-39.9) 04/14/2019   CAD (coronary artery disease) 12/16/2012   Hyperlipidemia 12/15/2012   ST elevation myocardial infarction (STEMI) of inferior wall (HCC) 12/13/2012   HTN (hypertension) 12/13/2012   Past Medical History:  Diagnosis Date   CAD (coronary artery disease)    a. 11/2012  Acute Inf STEMI/Cath/PCI: LM nl, LAD nl, LCX 30p, OM1 30p, RCA 20p, 41m, 95d (3.5x12 promus premier DES), PDA 6m (2.51mm vessel), RPL 100 (2.25x12 promus premier DES), EF 60%;  b. 11/2012 Echo: EF 55%, mild LVH.   History of kidney stones    HTN (hypertension)    Hyperlipidemia    Obesity (BMI 30-39.9) 04/14/2019   OSA (obstructive sleep apnea) 04/14/2019   severe OSA with an AHI of 33/hr and severe nocturnal hypoxemia with O2 sats as low as 80%   Osteoarthritis of right shoulder    Past Surgical History:  Procedure Laterality Date   CARDIAC CATHETERIZATION     COLONOSCOPY     LEFT HEART CATHETERIZATION WITH CORONARY ANGIOGRAM N/A 12/13/2012   Procedure: LEFT HEART CATHETERIZATION WITH CORONARY ANGIOGRAM;  Surgeon: Lonni JONETTA Cash, MD;  Location: [facility] CATH  LAB;  Service: Cardiovascular;  Laterality: N/A;   MUSCLE BIOPSY     None     PERCUTANEOUS CORONARY STENT INTERVENTION (PCI-S) N/A 12/13/2012   Procedure: PERCUTANEOUS CORONARY STENT INTERVENTION (PCI-S);  Surgeon: Lonni JONETTA Cash, MD;  Location: [facility] CATH LAB;  Service: Cardiovascular;  Laterality: N/A;   SHOULDER ARTHROSCOPY WITH ROTATOR CUFF REPAIR Right 10/12/2020   Procedure: SHOULDER ARTHROSCOPY WITH ROTATOR CUFF REPAIR;  Surgeon: Cristy Bonner DASEN, MD;  Location: [facility];  Service: Orthopedics;  Laterality: Right;   Allergies  Allergen Reactions   Lisinopril      cough   Other     Mushrooms - GI Problems   Statins     Myalgias    Prior to Admission medications   Medication Sig Start Date End Date Taking? Authorizing Provider  clopidogrel  (PLAVIX ) 75 MG tablet Take 1 tablet by mouth daily. 08/30/23  Yes Cash Lonni JONETTA, MD  Evolocumab  (REPATHA  SURECLICK) 140 MG/ML SOAJ INJECT 1 PEN SUBCUTANEOUS EVERY 14 DAILY 05/22/23  Yes Cash Lonni JONETTA, MD  losartan  (COZAAR ) 25 MG tablet Take 1 tablet by mouth daily. 05/07/23  Yes Cash Lonni JONETTA, MD  metoprolol  tartrate (LOPRESSOR ) 25 MG tablet Take 1 tablet by mouth twice daily. Generic equivalent for Lopressor . 09/16/23  Yes Cash Lonni JONETTA, MD  Multiple Vitamin (MULTIVITAMIN WITH MINERALS) TABS tablet Take 1 tablet by mouth daily.   Yes [provider]  nitroGLYCERIN  (NITROSTAT ) 0.4 MG SL tablet Place 1 tablet (0.4 mg total) under the tongue every 5 (five) minutes as needed for chest pain. 09/13/23  Yes Cash Lonni JONETTA, MD  Saccharomyces boulardii (PROBIOTIC) 250 MG CAPS Take by mouth.   Yes [provider]   Social History   Socioeconomic History   Marital status: Single    Spouse name: Not on file   Number of children: Not on file   Years of education: Not on file   Highest education level: Not on file  Occupational History   Occupation: Works in [employer] shop     Employer: [employer]  Tobacco Use   Smoking status: Never   Smokeless tobacco: Never  Vaping Use   Vaping status: Never Used  Substance and Sexual Activity   Alcohol use: Yes    Comment: social   Drug use: No   Sexual activity: Not on file  Other Topics Concern   Not on file  Social History Narrative   Not on file   Social Drivers of Health   Financial Resource Strain: Not on file  Food Insecurity: No Food Insecurity (06/08/2022)   Hunger Vital Sign    Worried About Running Out of  Food in the Last Year: Never true    Ran Out of Food in the Last Year: Never true  Transportation Needs: No Transportation Needs (06/08/2022)   PRAPARE - Administrator, Civil Service (Medical): No    Lack of Transportation (Non-Medical): No  Physical Activity: Not on file  Stress: Not on file  Social Connections: Not on file  Intimate Partner Violence: Not on file    Review of Systems 13 point review of systems per patient health survey noted.  Negative other than as indicated above or in HPI.  Chronic tinnitus.   Objective:   Vitals:   09/30/23 1513  BP: 128/76  Pulse: 76  Temp: 98.7 F (37.1 C)  TempSrc: Temporal  SpO2: 97%  Weight: 244 lb 12.8 oz (111 kg)  Height: 5' 9 (1.753 m)     Physical Exam Vitals reviewed.  Constitutional:      Appearance: He is well-developed.  HENT:     Head: Normocephalic and atraumatic.     Right Ear: External ear normal.     Left Ear: External ear normal.  Eyes:     Conjunctiva/sclera: Conjunctivae normal.     Pupils: Pupils are equal, round, and reactive to light.  Neck:     Thyroid: No thyromegaly.  Cardiovascular:     Rate and Rhythm: Normal rate and regular rhythm.     Heart sounds: Normal heart sounds.  Pulmonary:     Effort: Pulmonary effort is normal. No respiratory distress.     Breath sounds: Normal breath sounds. No wheezing.  Abdominal:     General: There is no distension.     Palpations: Abdomen is soft.      Tenderness: There is no abdominal tenderness.  Musculoskeletal:        General: No tenderness. Normal range of motion.     Cervical back: Normal range of motion and neck supple.  Lymphadenopathy:     Cervical: No cervical adenopathy.  Skin:    General: Skin is warm and dry.  Neurological:     Mental Status: He is alert and oriented to person, place, and time.     Deep Tendon Reflexes: Reflexes are normal and symmetric.  Psychiatric:        Behavior: Behavior normal.        Assessment & Plan:  Frederick Baker is a 63 y.o. male . Annual physical exam - Plan: CBC with Differential/Platelet, Comprehensive metabolic panel, Lipid panel, PSA, Hemoglobin A1c  - -anticipatory guidance as below in AVS, screening labs above. Health maintenance items as above in HPI discussed/recommended as applicable.   -After his visit I did look into the pneumonia vaccine question further and recent change in recommendations from ACIP for pneumonia vaccination starting at age 9.  Message sent to patient regarding option of nurse visit for Prevnar 20 or can have that given at his pharmacy if he would prefer.   Screening for diabetes mellitus - Plan: Hemoglobin A1c  Screening for prostate cancer - Plan: PSA  Primary hypertension - Plan: CBC with Differential/Platelet  Hyperlipidemia, unspecified hyperlipidemia type - Plan: Comprehensive metabolic panel, Lipid panel  Tolerating current med regimen for hypertension, hyperlipidemia with recent cardiology visit as above.  No changes.  No orders of the defined types were placed in this encounter.  Patient Instructions  Thank you for coming today.  I will check some labs and if there were any concerns I will let you know regarding those results.  No change  in medications for now.  Slowly increase exercise with an ultimate goal of 150 minutes/week.  As far as intensity of exercise, make sure to discuss that with your cardiologist.  Take care!   Preventive  Care 65-28 Years Old, Male Preventive care refers to lifestyle choices and visits with your health care provider that can promote health and wellness. Preventive care visits are also called wellness exams. What can I expect for my preventive care visit? Counseling During your preventive care visit, your health care provider may ask about your: Medical history, including: Past medical problems. Family medical history. Current health, including: Emotional well-being. Home life and relationship well-being. Sexual activity. Lifestyle, including: Alcohol, nicotine or tobacco, and drug use. Access to firearms. Diet, exercise, and sleep habits. Safety issues such as seatbelt and bike helmet use. Sunscreen use. Work and work astronomer. Physical exam Your health care provider will check your: Height and weight. These may be used to calculate your BMI (body mass index). BMI is a measurement that tells if you are at a healthy weight. Waist circumference. This measures the distance around your waistline. This measurement also tells if you are at a healthy weight and may help predict your risk of certain diseases, such as type 2 diabetes and high blood pressure. Heart rate and blood pressure. Body temperature. Skin for abnormal spots. What immunizations do I need?  Vaccines are usually given at various ages, according to a schedule. Your health care provider will recommend vaccines for you based on your age, medical history, and lifestyle or other factors, such as travel or where you work. What tests do I need? Screening Your health care provider may recommend screening tests for certain conditions. This may include: Lipid and cholesterol levels. Diabetes screening. This is done by checking your blood sugar (glucose) after you have not eaten for a while (fasting). Hepatitis B test. Hepatitis C test. HIV (human immunodeficiency virus) test. STI (sexually transmitted infection) testing, if  you are at risk. Lung cancer screening. Prostate cancer screening. Colorectal cancer screening. Talk with your health care provider about your test results, treatment options, and if necessary, the need for more tests. Follow these instructions at home: Eating and drinking  Eat a diet that includes fresh fruits and vegetables, whole grains, lean protein, and low-fat dairy products. Take vitamin and mineral supplements as recommended by your health care provider. Do not drink alcohol if your health care provider tells you not to drink. If you drink alcohol: Limit how much you have to 0-2 drinks a day. Know how much alcohol is in your drink. In the [place] one drink equals one 12 oz bottle of beer (355 mL), one 5 oz glass of wine (148 mL), or one 1 oz glass of hard liquor (44 mL). Lifestyle Brush your teeth every morning and night with fluoride toothpaste. Floss one time each day. Exercise for at least 30 minutes 5 or more days each week. Do not use any products that contain nicotine or tobacco. These products include cigarettes, chewing tobacco, and vaping devices, such as e-cigarettes. If you need help quitting, ask your health care provider. Do not use drugs. If you are sexually active, practice safe sex. Use a condom or other form of protection to prevent STIs. Take aspirin  only as told by your health care provider. Make sure that you understand how much to take and what form to take. Work with your health care provider to find out whether it is safe and beneficial for you to  take aspirin  daily. Find healthy ways to manage stress, such as: Meditation, yoga, or listening to music. Journaling. Talking to a trusted person. Spending time with friends and family. Minimize exposure to UV radiation to reduce your risk of skin cancer. Safety Always wear your seat belt while driving or riding in a vehicle. Do not drive: If you have been drinking alcohol. Do not ride with someone who has been  drinking. When you are tired or distracted. While texting. If you have been using any mind-altering substances or drugs. Wear a helmet and other protective equipment during sports activities. If you have firearms in your house, make sure you follow all gun safety procedures. What's next? Go to your health care provider once a year for an annual wellness visit. Ask your health care provider how often you should have your eyes and teeth checked. Stay up to date on all vaccines. This information is not intended to replace advice given to you by your health care provider. Make sure you discuss any questions you have with your health care provider. Document Revised: 03/08/2021 Document Reviewed: 03/08/2021 Elsevier Patient Education  2024 Elsevier Inc.     Signed,   Reyes Pines, MD [facility], [facility] 09/30/23 4:07 PM

## 2023-09-30 NOTE — Patient Instructions (Addendum)
 Thank you for coming today.  I will check some labs and if there were any concerns I will let you know regarding those results.  No change in medications for now.  Slowly increase exercise with an ultimate goal of 150 minutes/week.  As far as intensity of exercise, make sure to discuss that with your cardiologist.  Take care!   Preventive Care 93-64 Years Old, Male Preventive care refers to lifestyle choices and visits with your health care provider that can promote health and wellness. Preventive care visits are also called wellness exams. What can I expect for my preventive care visit? Counseling During your preventive care visit, your health care provider may ask about your: Medical history, including: Past medical problems. Family medical history. Current health, including: Emotional well-being. Home life and relationship well-being. Sexual activity. Lifestyle, including: Alcohol, nicotine or tobacco, and drug use. Access to firearms. Diet, exercise, and sleep habits. Safety issues such as seatbelt and bike helmet use. Sunscreen use. Work and work astronomer. Physical exam Your health care provider will check your: Height and weight. These may be used to calculate your BMI (body mass index). BMI is a measurement that tells if you are at a healthy weight. Waist circumference. This measures the distance around your waistline. This measurement also tells if you are at a healthy weight and may help predict your risk of certain diseases, such as type 2 diabetes and high blood pressure. Heart rate and blood pressure. Body temperature. Skin for abnormal spots. What immunizations do I need?  Vaccines are usually given at various ages, according to a schedule. Your health care provider will recommend vaccines for you based on your age, medical history, and lifestyle or other factors, such as travel or where you work. What tests do I need? Screening Your health care provider may recommend  screening tests for certain conditions. This may include: Lipid and cholesterol levels. Diabetes screening. This is done by checking your blood sugar (glucose) after you have not eaten for a while (fasting). Hepatitis B test. Hepatitis C test. HIV (human immunodeficiency virus) test. STI (sexually transmitted infection) testing, if you are at risk. Lung cancer screening. Prostate cancer screening. Colorectal cancer screening. Talk with your health care provider about your test results, treatment options, and if necessary, the need for more tests. Follow these instructions at home: Eating and drinking  Eat a diet that includes fresh fruits and vegetables, whole grains, lean protein, and low-fat dairy products. Take vitamin and mineral supplements as recommended by your health care provider. Do not drink alcohol if your health care provider tells you not to drink. If you drink alcohol: Limit how much you have to 0-2 drinks a day. Know how much alcohol is in your drink. In the [place] one drink equals one 12 oz bottle of beer (355 mL), one 5 oz glass of wine (148 mL), or one 1 oz glass of hard liquor (44 mL). Lifestyle Brush your teeth every morning and night with fluoride toothpaste. Floss one time each day. Exercise for at least 30 minutes 5 or more days each week. Do not use any products that contain nicotine or tobacco. These products include cigarettes, chewing tobacco, and vaping devices, such as e-cigarettes. If you need help quitting, ask your health care provider. Do not use drugs. If you are sexually active, practice safe sex. Use a condom or other form of protection to prevent STIs. Take aspirin  only as told by your health care provider. Make sure that you understand  how much to take and what form to take. Work with your health care provider to find out whether it is safe and beneficial for you to take aspirin  daily. Find healthy ways to manage stress, such as: Meditation, yoga, or  listening to music. Journaling. Talking to a trusted person. Spending time with friends and family. Minimize exposure to UV radiation to reduce your risk of skin cancer. Safety Always wear your seat belt while driving or riding in a vehicle. Do not drive: If you have been drinking alcohol. Do not ride with someone who has been drinking. When you are tired or distracted. While texting. If you have been using any mind-altering substances or drugs. Wear a helmet and other protective equipment during sports activities. If you have firearms in your house, make sure you follow all gun safety procedures. What's next? Go to your health care provider once a year for an annual wellness visit. Ask your health care provider how often you should have your eyes and teeth checked. Stay up to date on all vaccines. This information is not intended to replace advice given to you by your health care provider. Make sure you discuss any questions you have with your health care provider. Document Revised: 03/08/2021 Document Reviewed: 03/08/2021 Elsevier Patient Education  2024 [employer].

## 2023-10-01 ENCOUNTER — Encounter: Payer: Self-pay | Admitting: Family Medicine

## 2023-10-01 LAB — COMPREHENSIVE METABOLIC PANEL
ALT: 41 U/L (ref 0–53)
AST: 28 U/L (ref 0–37)
Albumin: 4.7 g/dL (ref 3.5–5.2)
Alkaline Phosphatase: 49 U/L (ref 39–117)
BUN: 17 mg/dL (ref 6–23)
CO2: 29 meq/L (ref 19–32)
Calcium: 9.5 mg/dL (ref 8.4–10.5)
Chloride: 102 meq/L (ref 96–112)
Creatinine, Ser: 1.03 mg/dL (ref 0.40–1.50)
GFR: 77.15 mL/min (ref >60.00)
Glucose, Bld: 86 mg/dL (ref 70–99)
Potassium: 4.6 meq/L (ref 3.5–5.1)
Sodium: 138 meq/L (ref 135–145)
Total Bilirubin: 0.5 mg/dL (ref 0.2–1.2)
Total Protein: 7.4 g/dL (ref 6.0–8.3)

## 2023-10-01 LAB — CBC WITH DIFFERENTIAL/PLATELET
Basophils Absolute: 0.1 10*3/uL (ref 0.0–0.1)
Basophils Relative: 0.7 % (ref 0.0–3.0)
Eosinophils Absolute: 0.2 10*3/uL (ref 0.0–0.7)
Eosinophils Relative: 2.4 % (ref 0.0–5.0)
HCT: 44.1 % (ref 39.0–52.0)
Hemoglobin: 15.1 g/dL (ref 13.0–17.0)
Lymphocytes Relative: 44.3 % (ref 12.0–46.0)
Lymphs Abs: 3 10*3/uL (ref 0.7–4.0)
MCHC: 34.3 g/dL (ref 30.0–36.0)
MCV: 100 fL (ref 78.0–100.0)
Monocytes Absolute: 0.7 10*3/uL (ref 0.1–1.0)
Monocytes Relative: 9.8 % (ref 3.0–12.0)
Neutro Abs: 2.9 10*3/uL (ref 1.4–7.7)
Neutrophils Relative %: 42.8 % — ABNORMAL LOW (ref 43.0–77.0)
Platelets: 225 10*3/uL (ref 150.0–400.0)
RBC: 4.41 Mil/uL (ref 4.22–5.81)
RDW: 12.7 % (ref 11.5–15.5)
WBC: 6.8 10*3/uL (ref 4.0–10.5)

## 2023-10-01 LAB — LIPID PANEL
Cholesterol: 156 mg/dL (ref 0–200)
HDL: 37.7 mg/dL — ABNORMAL LOW (ref >39.00)
LDL Cholesterol: 63 mg/dL (ref 0–99)
NonHDL: 118.71
Total CHOL/HDL Ratio: 4
Triglycerides: 280 mg/dL — ABNORMAL HIGH (ref 0.0–149.0)
VLDL: 56 mg/dL — ABNORMAL HIGH (ref 0.0–40.0)

## 2023-10-01 LAB — HEMOGLOBIN A1C: Hgb A1c MFr Bld: 5.4 % (ref 4.6–6.5)

## 2023-10-01 LAB — PSA: PSA: 0.93 ng/mL (ref 0.10–4.00)

## 2023-11-15 ENCOUNTER — Other Ambulatory Visit: Payer: Self-pay | Admitting: Cardiovascular Disease

## 2024-01-14 ENCOUNTER — Telehealth: Payer: Self-pay

## 2024-01-14 NOTE — Telephone Encounter (Signed)
   Pre-operative Risk Assessment    Patient Name: Frederick Baker  DOB: 1961-03-21 MRN: [id number]   Date of last office visit: 09/13/23 Antoinette Batman, MD Date of next office visit: NONE   Request for Surgical Clearance    Procedure:   LITHOTRIPSY  Date of Surgery:  Clearance 01/23/24                                Surgeon:  DR BRAIN BUDZYN Surgeon's Group or Practice Name:  [facility] Phone number:  [phone] Fax number:  [phone]  / [phone]   Type of Clearance Requested:   - Medical  - Pharmacy:  Hold Clopidogrel  (Plavix ) 5 DAYS PRIOR   Type of Anesthesia:  Not Indicated   Additional requests/questions:    Signed, Collin Deal   01/14/2024, 4:06 PM ]

## 2024-01-15 ENCOUNTER — Telehealth: Payer: Self-pay

## 2024-01-15 NOTE — Telephone Encounter (Signed)
  Patient Consent for Virtual Visit        Frederick Baker has provided verbal consent on 01/15/2024 for a virtual visit (video or telephone).   CONSENT FOR VIRTUAL VISIT FOR:  Frederick Baker  By participating in this virtual visit I agree to the following:  I hereby voluntarily request, consent and authorize [facility] and its employed or contracted physicians, physician assistants, nurse practitioners or other licensed health care professionals (the Practitioner), to provide me with telemedicine health care services (the "Services") as deemed necessary by the treating Practitioner. I acknowledge and consent to receive the Services by the Practitioner via telemedicine. I understand that the telemedicine visit will involve communicating with the Practitioner through live audiovisual communication technology and the disclosure of certain medical information by electronic transmission. I acknowledge that I have been given the opportunity to request an in-person assessment or other available alternative prior to the telemedicine visit and am voluntarily participating in the telemedicine visit.  I understand that I have the right to withhold or withdraw my consent to the use of telemedicine in the course of my care at any time, without affecting my right to future care or treatment, and that the Practitioner or I may terminate the telemedicine visit at any time. I understand that I have the right to inspect all information obtained and/or recorded in the course of the telemedicine visit and may receive copies of available information for a reasonable fee.  I understand that some of the potential risks of receiving the Services via telemedicine include:  Delay or interruption in medical evaluation due to technological equipment failure or disruption; Information transmitted may not be sufficient (e.g. poor resolution of images) to allow for appropriate medical decision making by the  Practitioner; and/or  In rare instances, security protocols could fail, causing a breach of personal health information.  Furthermore, I acknowledge that it is my responsibility to provide information about my medical history, conditions and care that is complete and accurate to the best of my ability. I acknowledge that Practitioner's advice, recommendations, and/or decision may be based on factors not within their control, such as incomplete or inaccurate data provided by me or distortions of diagnostic images or specimens that may result from electronic transmissions. I understand that the practice of medicine is not an exact science and that Practitioner makes no warranties or guarantees regarding treatment outcomes. I acknowledge that a copy of this consent can be made available to me via my patient portal [facility] MyChart), or I can request a printed copy by calling the office of  [facility].    I understand that my insurance will be billed for this visit.   I have read or had this consent read to me. I understand the contents of this consent, which adequately explains the benefits and risks of the Services being provided via telemedicine.  I have been provided ample opportunity to ask questions regarding this consent and the Services and have had my questions answered to my satisfaction. I give my informed consent for the services to be provided through the use of telemedicine in my medical care

## 2024-01-15 NOTE — Telephone Encounter (Signed)
 Spoke with patient who is agreeable to do a tele visit on 4/24 at 9 am. Med rec and consent have been done.

## 2024-01-15 NOTE — Progress Notes (Unsigned)
 Virtual Visit via Telephone Note   Because of Frederick Baker co-morbid illnesses, he is at least at moderate risk for complications without adequate follow up.  This format is felt to be most appropriate for this patient at this time.  Due to technical limitations with video connection (technology), today's appointment will be conducted as an audio only telehealth visit, and Frederick Baker verbally agreed to proceed in this manner.   All issues noted in this document were discussed and addressed.  No physical exam could be performed with this format.  Evaluation Performed:  Preoperative cardiovascular risk assessment _____________   Date:  01/15/2024   Patient ID:  Frederick Baker, DOB [birth year], MRN [id number] Patient Location:  Home Provider location:   Office  Primary Care Provider:  Benjiman Bras, MD Primary Cardiologist:  Antoinette Batman, MD  Chief Complaint / Patient Profile   63 y.o. y/o male with a h/o coronary artery disease, essential hypertension, hyperlipidemia, OSA who is pending lithotripsy and presents today for telephonic preoperative cardiovascular risk assessment.  History of Present Illness    Frederick Baker is a 63 y.o. male who presents via audio/video conferencing for a telehealth visit today.  Pt was last seen in cardiology clinic on 09/13/2023 by Dr. Abel Hoe.  At that time Frederick Baker was doing well .  The patient is now pending procedure as outlined above. Since his last visit, he continues to be stable from a cardiac standpoint.  Today he denies chest pain, shortness of breath, lower extremity edema, fatigue, palpitations, melena, hematuria, hemoptysis, diaphoresis, weakness, presyncope, syncope, orthopnea, and PND.   Past Medical History    Past Medical History:  Diagnosis Date   CAD (coronary artery disease)    a. 11/2012 Acute Inf STEMI/Cath/PCI: LM nl, LAD nl, LCX 30p, OM1 30p, RCA 20p, 76m, 95d (3.5x12 promus premier DES), PDA  35m (2.66mm vessel), RPL 100 (2.25x12 promus premier DES), EF 60%;  b. 11/2012 Echo: EF 55%, mild LVH.   History of kidney stones    HTN (hypertension)    Hyperlipidemia    Obesity (BMI 30-39.9) 04/14/2019   OSA (obstructive sleep apnea) 04/14/2019   severe OSA with an AHI of 33/hr and severe nocturnal hypoxemia with O2 sats as low as 80%   Osteoarthritis of right shoulder    Past Surgical History:  Procedure Laterality Date   CARDIAC CATHETERIZATION     COLONOSCOPY     LEFT HEART CATHETERIZATION WITH CORONARY ANGIOGRAM N/A 12/13/2012   Procedure: LEFT HEART CATHETERIZATION WITH CORONARY ANGIOGRAM;  Surgeon: Odie Benne, MD;  Location: [facility] CATH LAB;  Service: Cardiovascular;  Laterality: N/A;   MUSCLE BIOPSY     None     PERCUTANEOUS CORONARY STENT INTERVENTION (PCI-S) N/A 12/13/2012   Procedure: PERCUTANEOUS CORONARY STENT INTERVENTION (PCI-S);  Surgeon: Odie Benne, MD;  Location: [facility] C[facility] LAB;  Service: Cardiovascular;  Laterality: N/A;   SHOULDER ARTHROSCOPY WITH ROTATOR CUFF REPAIR Right 10/12/2020   Procedure: SHOULDER ARTHROSCOPY WITH ROTATOR CUFF REPAIR;  Surgeon: Micheline Ahr, MD;  Location: [facility];  Service: Orthopedics;  Laterality: Right;    Allergies  Allergies  Allergen Reactions   Lisinopril      cough   Other     Mushrooms - GI Problems   Statins     Myalgias     Home Medications    Prior to Admission medications   Medication Sig Start Date End Date Taking? Authorizing Provider  clopidogrel  (PLAVIX )  75 MG tablet Take 1 tablet (75 mg total) by mouth daily. 11/15/23   Odie Benne, MD  Evolocumab  (REPATHA  SURECLICK) 140 MG/ML SOAJ INJECT 1 PEN SUBCUTANEOUS EVERY 14 DAILY 05/22/23   Odie Benne, MD  losartan  (COZAAR ) 25 MG tablet Take 1 tablet (25 mg total) by mouth daily. 11/15/23   Odie Benne, MD  metoprolol  tartrate (LOPRESSOR ) 25 MG tablet Take 1 tablet by mouth twice daily. Generic  equivalent for Lopressor . 09/16/23   Odie Benne, MD  Multiple Vitamin (MULTIVITAMIN WITH MINERALS) TABS tablet Take 1 tablet by mouth daily.    [provider]  nitroGLYCERIN  (NITROSTAT ) 0.4 MG SL tablet Place 1 tablet (0.4 mg total) under the tongue every 5 (five) minutes as needed for chest pain. 09/13/23   Odie Benne, MD  Saccharomyces boulardii (PROBIOTIC) 250 MG CAPS Take by mouth.    [provider]    Physical Exam    Vital Signs:  Frederick Baker does not have vital signs available for review today.  Given telephonic nature of communication, physical exam is limited. AAOx3. NAD. Normal affect.  Speech and respirations are unlabored.  Accessory Clinical Findings    None  Assessment & Plan    1.  Preoperative Cardiovascular Risk Assessment:Procedure:   LITHOTRIPSY   Date of Surgery:  Clearance 01/23/24                                  Surgeon:  DR BRAIN BUDZYN Surgeon's Group or Practice Name:  [facility] Phone number:  [phone] Fax number:  [phone]  / [phone]      Primary Cardiologist: Antoinette Batman, MD  Chart reviewed as part of pre-operative protocol coverage. Given past medical history and time since last visit, based on ACC/AHA guidelines, Frederick Baker would be at acceptable risk for the planned procedure without further cardiovascular testing.   His Plavix  may be held for 5 days prior to his procedure.  Please resume as soon as hemostasis is achieved.  His RCRI is low risk, 0.9% risk of major cardiac event.  He is able to complete greater than 4 METS of physical activity.  Patient was advised that if he develops new symptoms prior to surgery to contact our office to arrange a follow-up appointment.  He verbalized understanding.  I will route this recommendation to the requesting party via Epic fax function and remove from pre-op pool.  Please call with  questions.       Time:   Today, I have spent 6 minutes with the patient with telehealth technology discussing medical history, symptoms, and management plan.  I spent 10 minutes reviewing patient's past medical history, cardiac medications, and cardiac test.   Carie Charity, NP  01/15/2024, 12:43 PM

## 2024-01-15 NOTE — Telephone Encounter (Signed)
   Name: Frederick Baker  DOB: 1961/06/27  MRN: [id number]  Primary Cardiologist: Antoinette Batman, MD   Preoperative team, please contact this patient and set up a phone call appointment for further preoperative risk assessment. Please obtain consent and complete medication review. Thank you for your help.  I confirm that guidance regarding antiplatelet and oral anticoagulation therapy has been completed and, if necessary, noted below.  Per Dr. Abel Hoe, he may hold Plavix  for 5 days prior to procedure. Please resume Plavix  as soon as possible postprocedure, at the discretion of the surgeon.   I also confirmed the patient resides in the state of [place] . As per [facility] Medical Board telemedicine laws, the patient must reside in the state in which the provider is licensed.   Jude Norton, NP 01/15/2024, 7:45 AM [facility]

## 2024-01-16 ENCOUNTER — Ambulatory Visit: Attending: Cardiology

## 2024-01-16 DIAGNOSIS — Z01810 Encounter for preprocedural cardiovascular examination: Secondary | ICD-10-CM

## 2024-03-25 NOTE — Telephone Encounter (Signed)
 Fax came from Dr Oneil Forget stating patient has declined the Oral Appliance Therapy for OSA. The patient has been referred back to Dr Shlomo for additional treatment options.

## 2024-03-30 NOTE — Addendum Note (Signed)
 Addended by: JOSHUA DALTON MATSU on: 03/30/2024 12:25 PM   Modules accepted: Orders

## 2024-04-01 NOTE — Addendum Note (Signed)
 Addended by: JOSHUA DALTON MATSU on: 04/01/2024 05:02 PM   Modules accepted: Orders

## 2024-04-01 NOTE — Telephone Encounter (Signed)
 Prior Authorization for split night sent to [facility] via web portal. Tracking  Number .  Request does not meet criteria for in lab study-Switch to Devices using   Peripheral Arterial Tone (PAT)-Your request will be complete. Order ID: [id number] Authorized- Approval Valid Through:04/01/2024 - 05/30/2024

## 2024-04-02 NOTE — Telephone Encounter (Signed)
 Reached out to patient who states his insurance is out of network with [facility] and will not have in network insurance until January 2026.

## 2024-06-08 NOTE — Progress Notes (Signed)
 Fax received from Oneil Forget, DDS [employer] 03/24/2024 with multiple patients identifying info on it. Noted this patient,Frederick Baker DOB May 19, 1961 ...was evaluated for Oral Appliance Therapy to treat OSA/snoring. Patient declined Oral Appliance Therapy for OSA/Snoring in our office. The patient has been referred to you to discuss additional treatment options. Dr. Shlomo is aware.

## 2024-06-15 ENCOUNTER — Other Ambulatory Visit: Payer: Self-pay | Admitting: Pharmacist

## 2024-06-15 MED ORDER — REPATHA SURECLICK 140 MG/ML ~~LOC~~ SOAJ: SUBCUTANEOUS | 11 refills | Status: AC

## 2024-08-17 ENCOUNTER — Telehealth: Admitting: Cardiovascular Disease

## 2024-08-17 ENCOUNTER — Other Ambulatory Visit (HOSPITAL_COMMUNITY): Payer: Self-pay

## 2024-08-17 NOTE — Telephone Encounter (Signed)
 LVM to inform pt PA team can't submit until insurance is official active. Told him I will forward them another message next Monday or Tuesday when it starts and they will be in contact about approval.

## 2024-08-17 NOTE — Telephone Encounter (Signed)
 We cannot submit any authorization to the plan until it is active.

## 2024-08-17 NOTE — Telephone Encounter (Signed)
 Pt c/o medication issue:  1. Name of Medication:   Evolocumab  (REPATHA  SURECLICK) 140 MG/ML SOAJ    2. How are you currently taking this medication (dosage and times per day)? As written   3. Are you having a reaction (difficulty breathing--STAT)? No   4. What is your medication issue? Pt called in stating his new insurance goes into effect Monday 08/24/24. He is switching to [employer]. Not sure if anything can done now but he states they won't cover it.

## 2024-08-26 ENCOUNTER — Other Ambulatory Visit (HOSPITAL_COMMUNITY): Payer: Self-pay

## 2024-08-26 ENCOUNTER — Other Ambulatory Visit: Payer: Self-pay | Admitting: Cardiovascular Disease

## 2024-08-26 ENCOUNTER — Telehealth: Payer: Self-pay | Admitting: Pharmacy Technician

## 2024-08-26 NOTE — Telephone Encounter (Signed)
 Per pt his insurance does not cover Repatha  at all. He wants to know if there is something he can do like pt assistance or a coupon. Please advise.

## 2024-08-26 NOTE — Telephone Encounter (Signed)
 Informed patient he can access a co-pay card for Repatha  on their website. Patient asked if there is any kind of patient assistance for Repatha , will forward to Rx Med Assistance team.

## 2024-08-26 NOTE — Telephone Encounter (Signed)
   I called the patient to get his new insurance information but he only has a discount card. He said he makes too much to get [employer] assistance even with no prescription insurance. He said he is going to check into getting prescription insurance and let us  know. He could get repatha  for 239.00 a month on goodrx.

## 2024-08-26 NOTE — Telephone Encounter (Signed)
 Patient to call me back. He currently does not have prescription insurance. His bcbs termed 08/23/24. His new insurance is just medical. He said he makes too much for assistance with amgen. Goodrx is 239 a month for repatha . No other assistance available for uninsured. He said he will work on getting prescription insurance then we can proceed

## 2024-08-27 ENCOUNTER — Other Ambulatory Visit (HOSPITAL_COMMUNITY): Payer: Self-pay

## 2024-08-27 NOTE — Telephone Encounter (Signed)
  FOR REPATHA , LEQVIO AND PRALUENT  ON  Proest [id number] NO PCN PI:[id number]  Says this is a discount card -[facility] sent fax with this insurance. Patient still working on sports administrator

## 2024-09-02 ENCOUNTER — Other Ambulatory Visit (HOSPITAL_COMMUNITY): Payer: Self-pay

## 2024-09-02 ENCOUNTER — Telehealth: Payer: Self-pay | Admitting: Pharmacy Technician

## 2024-09-02 MED ORDER — METOPROLOL TARTRATE 25 MG PO TABS: 25.0000 mg | ORAL_TABLET | Freq: Two times a day (BID) | ORAL | 0 refills | Status: AC

## 2024-09-02 MED ORDER — LOSARTAN POTASSIUM 25 MG PO TABS: 25.0000 mg | ORAL_TABLET | Freq: Every day | ORAL | 0 refills | Status: DC

## 2024-09-02 MED ORDER — CLOPIDOGREL BISULFATE 75 MG PO TABS: 75.0000 mg | ORAL_TABLET | Freq: Every day | ORAL | 0 refills | Status: DC

## 2024-09-02 NOTE — Telephone Encounter (Signed)
 Patient has to get metoprolol , losartan  and clopidogrel  at [facility]   The metoprolol  is free only 90 for 90 and he would owe 10.00 for the other 90/90ds. The clopidogrel  is free 90 days  The losartan  is free 90 days    He has to get repatha  at [facility]. He exceeds the 46,950 income requirement for amgen. He uses goodrx at the [employer] until he gets part d.     Goodrx info [facility]: 239.00 for 1 month BIN [id number] PCN [id number] [id number] Member ID [id number]

## 2024-09-02 NOTE — Telephone Encounter (Signed)
 Hi, the patient is requesting his metoprolol , losartan  and clopidogrel  to be sent to his new pharmacy called [facility] -I added this pharmacy to his pharmacy list. He would like 90 days on those 3. Thank you!

## 2024-09-02 NOTE — Telephone Encounter (Signed)
 Refills has been sent to the pharmacy.

## 2024-09-02 NOTE — Addendum Note (Signed)
 Addended by: BILLY CAMELIA CROME on: 09/02/2024 11:36 AM   Modules accepted: Orders

## 2024-09-30 ENCOUNTER — Ambulatory Visit: Payer: PRIVATE HEALTH INSURANCE | Admitting: Family Medicine

## 2024-09-30 ENCOUNTER — Encounter: Payer: Self-pay | Admitting: Family Medicine

## 2024-09-30 VITALS — BP 128/70 | HR 65 | Temp 98.8°F | Resp 15 | Ht 69.0 in | Wt 247.2 lb

## 2024-09-30 DIAGNOSIS — G720 Drug-induced myopathy: Secondary | ICD-10-CM | POA: Diagnosis not present

## 2024-09-30 DIAGNOSIS — E785 Hyperlipidemia, unspecified: Secondary | ICD-10-CM | POA: Diagnosis not present

## 2024-09-30 DIAGNOSIS — Z0000 Encounter for general adult medical examination without abnormal findings: Secondary | ICD-10-CM

## 2024-09-30 DIAGNOSIS — T466X5A Adverse effect of antihyperlipidemic and antiarteriosclerotic drugs, initial encounter: Secondary | ICD-10-CM | POA: Diagnosis not present

## 2024-09-30 DIAGNOSIS — I10 Essential (primary) hypertension: Secondary | ICD-10-CM | POA: Diagnosis not present

## 2024-09-30 DIAGNOSIS — R194 Change in bowel habit: Secondary | ICD-10-CM | POA: Insufficient documentation

## 2024-09-30 DIAGNOSIS — Z131 Encounter for screening for diabetes mellitus: Secondary | ICD-10-CM | POA: Diagnosis not present

## 2024-09-30 DIAGNOSIS — I252 Old myocardial infarction: Secondary | ICD-10-CM

## 2024-09-30 DIAGNOSIS — R748 Abnormal levels of other serum enzymes: Secondary | ICD-10-CM | POA: Insufficient documentation

## 2024-09-30 NOTE — Progress Notes (Signed)
 "  Subjective:  Patient ID: JANE BROUGHTON, male    DOB: 17-Apr-1961  Age: 64 y.o. MRN: [id number]  CC:  Chief Complaint  Patient presents with   Annual Exam    Patient would like to get labs done at [facility]. No questions or concerns. Patient understands this is a preventative visit and any other concerns will need another appt.     HPI JAMONT MELLIN presents for Annual Exam  Doing ok.  New insurance. I was out of network last year.  Will be on medicare in few months.  Considering closer practice due to  drive to our office.   [place] to [place] in February.   PCP, me Cardiology, Dr. Verlin, history of CADWith STEMI 2014, RCA.  DES., Hypertension, hyperlipidemia, OSA intolerant to CPAP.  Office visit December 2024.  Continued on Plavix , beta-blocker, Repatha , statin intolerant.  Plan for lipids and LFTs follow-up with me. Urology, Dr. Chauncey, history of nephrolithiasis. Dermatology, Dr. Joshua at [facility].no recent visit. Plans this year.  Prior R shoulder surgery in 2022 - Dr. Cristy. Doing well.   Myopathy with statins.   Brother with hx of diabetes.   Lab Results  Component Value Date   CHOL 156 09/30/2023   HDL 37.70 (L) 09/30/2023   LDLCALC 63 09/30/2023   TRIG 280.0 (H) 09/30/2023   CHOLHDL 4 09/30/2023   Lab Results  Component Value Date   ALT 41 09/30/2023   AST 28 09/30/2023   ALKPHOS 49 09/30/2023   BILITOT 0.5 09/30/2023   Wt Readings from Last 3 Encounters:  09/30/24 247 lb 3.2 oz (112.1 kg)  09/30/23 244 lb 12.8 oz (111 kg)  09/13/23 243 lb 6.4 oz (110.4 kg)         09/30/2024    3:30 PM 09/30/2023    3:11 PM 09/03/2022    1:48 PM 05/18/2019    8:13 AM 12/03/2017    2:26 PM  Depression screen PHQ 2/9  Decreased Interest 0 0 0 0 0  Down, Depressed, Hopeless 0 0 0 0 0  PHQ - 2 Score 0 0 0 0 0  Altered sleeping 1 1 1     Tired, decreased energy 1  0    Change in appetite 0 0 0    Feeling bad or failure about yourself  0 0 0    Trouble  concentrating 1 0 0    Moving slowly or fidgety/restless 0 0 0    Suicidal thoughts 0 0 0    PHQ-9 Score 3 1  1      Difficult doing work/chores Not difficult at all         Data saved with a previous flowsheet row definition    Health Maintenance  Topic Date Due   Influenza Vaccine  04/24/2024   COVID-19 Vaccine (1) 10/16/2024 (Originally 06/12/1961)   Pneumococcal Vaccine: 50+ Years (1 of 2 - PCV) 09/30/2025 (Originally 12/11/1978)   Colonoscopy  03/31/2026   DTaP/Tdap/Td (2 - Td or Tdap) 10/27/2026   Hepatitis C Screening  Completed   HIV Screening  Completed   Zoster Vaccines- Shingrix  Completed   Hepatitis B Vaccines 19-59 Average Risk  Aged Out   HPV VACCINES  Aged Out   Meningococcal B Vaccine  Aged Out  Colonoscopy 03/31/2016, repeat 10 years. Prostate:  He is followed by nephrology for nephrolithiasis. Prostate testing at urology.  Lab Results  Component Value Date   PSA1 0.7 10/27/2016   PSA 0.93 09/30/2023  PSA 0.78 09/03/2022     Immunization History  Administered Date(s) Administered   Influenza Inj Mdck Quad Pf 07/11/2017   Influenza Inj Mdck Quad With Preservative 08/07/2018   Influenza,inj,Quad PF,6+ Mos 05/18/2019   Influenza-Unspecified 08/27/2016, 06/23/2021, 07/13/2022, 05/31/2023   Tdap 10/27/2016   Zoster Recombinant(Shingrix) 03/31/2021, 07/07/2021  Had flu vaccine at pharmacy.  Declines PNA and covid booster at this time.    No results found. Optho yearly - stable.   Dental: every 4 months.   Alcohol:none  Tobacco: none.   Exercise: minimal. Still working - more time in office. Finding time to exercise has been difficult.    History Patient Active Problem List   Diagnosis Date Noted   Abnormal liver enzymes 09/30/2024   Change in bowel habits 09/30/2024   Status post shoulder surgery 10/12/2020   OSA (obstructive sleep apnea) 04/14/2019   Obesity (BMI 30-39.9) 04/14/2019   CAD (coronary artery disease) 12/16/2012   Hyperlipidemia  12/15/2012   ST elevation myocardial infarction (STEMI) of inferior wall (HCC) 12/13/2012   HTN (hypertension) 12/13/2012   Past Medical History:  Diagnosis Date   CAD (coronary artery disease)    a. 11/2012 Acute Inf STEMI/Cath/PCI: LM nl, LAD nl, LCX 30p, OM1 30p, RCA 20p, 25m, 95d (3.5x12 promus premier DES), PDA 64m (2.61mm vessel), RPL 100 (2.25x12 promus premier DES), EF 60%;  b. 11/2012 Echo: EF 55%, mild LVH.   History of kidney stones    HTN (hypertension)    Hyperlipidemia    Obesity (BMI 30-39.9) 04/14/2019   OSA (obstructive sleep apnea) 04/14/2019   severe OSA with an AHI of 33/hr and severe nocturnal hypoxemia with O2 sats as low as 80%   Osteoarthritis of right shoulder    Past Surgical History:  Procedure Laterality Date   CARDIAC CATHETERIZATION     COLONOSCOPY     LEFT HEART CATHETERIZATION WITH CORONARY ANGIOGRAM N/A 12/13/2012   Procedure: LEFT HEART CATHETERIZATION WITH CORONARY ANGIOGRAM;  Surgeon: Lonni JONETTA Cash, MD;  Location: [facility] CATH LAB;  Service: Cardiovascular;  Laterality: N/A;   MUSCLE BIOPSY     None     PERCUTANEOUS CORONARY STENT INTERVENTION (PCI-S) N/A 12/13/2012   Procedure: PERCUTANEOUS CORONARY STENT INTERVENTION (PCI-S);  Surgeon: Lonni JONETTA Cash, MD;  Location: [facility] CATH LAB;  Service: Cardiovascular;  Laterality: N/A;   SHOULDER ARTHROSCOPY WITH ROTATOR CUFF REPAIR Right 10/12/2020   Procedure: SHOULDER ARTHROSCOPY WITH ROTATOR CUFF REPAIR;  Surgeon: Cristy Bonner DASEN, MD;  Location:  SURGERY CENTER;  Service: Orthopedics;  Laterality: Right;   Allergies[1] Prior to Admission medications  Medication Sig Start Date End Date Taking? Authorizing Provider  clopidogrel  (PLAVIX ) 75 MG tablet Take 1 tablet (75 mg total) by mouth daily. 09/02/24  Yes Cash Lonni JONETTA, MD  Evolocumab  (REPATHA  SURECLICK) 140 MG/ML SOAJ INJECT 1 PEN SUBCUTANEOUS EVERY 14 DAILY 06/15/24  Yes Cash Lonni JONETTA, MD  losartan  (COZAAR ) 25 MG tablet  Take 1 tablet (25 mg total) by mouth daily. 09/02/24  Yes Cash Lonni JONETTA, MD  metoprolol  tartrate (LOPRESSOR ) 25 MG tablet Take 1 tablet (25 mg total) by mouth 2 (two) times daily. Please call [phone] to schedule an appointment with Dr. Cash to for future refills. Thank you. 2nd attempt. 09/02/24  Yes Cash Lonni JONETTA, MD  Multiple Vitamin (MULTIVITAMIN WITH MINERALS) TABS tablet Take 1 tablet by mouth daily.   Yes [provider]  nitroGLYCERIN  (NITROSTAT ) 0.4 MG SL tablet Place 1 tablet (0.4 mg total) under the tongue every 5 (  five) minutes as needed for chest pain. 09/13/23  Yes Verlin Lonni BIRCH, MD  Saccharomyces boulardii (PROBIOTIC) 250 MG CAPS Take by mouth.   Yes [provider]   Social History   Socioeconomic History   Marital status: Single    Spouse name: Not on file   Number of children: Not on file   Years of education: Not on file   Highest education level: Not on file  Occupational History   Occupation: Works in [employer] shop    Employer: [employer]  Tobacco Use   Smoking status: Never   Smokeless tobacco: Never  Vaping Use   Vaping status: Never Used  Substance and Sexual Activity   Alcohol use: Yes    Comment: social   Drug use: No   Sexual activity: Not on file  Other Topics Concern   Not on file  Social History Narrative   Not on file   Social Drivers of Health   Tobacco Use: Low Risk (09/30/2024)   Patient History    Smoking Tobacco Use: Never    Smokeless Tobacco Use: Never    Passive Exposure: Not on file  Financial Resource Strain: Not on file  Food Insecurity: No Food Insecurity (06/08/2022)   Hunger Vital Sign    Worried About Running Out of Food in the Last Year: Never true    Ran Out of Food in the Last Year: Never true  Transportation Needs: No Transportation Needs (06/08/2022)   PRAPARE - Administrator, Civil Service (Medical): No    Lack of Transportation (Non-Medical): No  Physical  Activity: Not on file  Stress: Not on file  Social Connections: Not on file  Intimate Partner Violence: Not on file  Depression (PHQ2-9): Low Risk (09/30/2024)   Depression (PHQ2-9)    PHQ-2 Score: 3  Alcohol Screen: Not on file  Housing: Not on file  Utilities: Not on file  Health Literacy: Not on file    Review of Systems 13 point review of systems per patient health survey noted.  Negative other than as indicated above or in HPI.    Objective:   Vitals:   09/30/24 1522  BP: 128/70  Pulse: 65  Resp: 15  Temp: 98.8 F (37.1 C)  TempSrc: Temporal  SpO2: 97%  Weight: 247 lb 3.2 oz (112.1 kg)  Height: 5' 9 (1.753 m)     Physical Exam Vitals reviewed.  Constitutional:      Appearance: He is well-developed.  HENT:     Head: Normocephalic and atraumatic.     Right Ear: External ear normal.     Left Ear: External ear normal.  Eyes:     Conjunctiva/sclera: Conjunctivae normal.     Pupils: Pupils are equal, round, and reactive to light.  Neck:     Thyroid: No thyromegaly.  Cardiovascular:     Rate and Rhythm: Normal rate and regular rhythm.     Heart sounds: Normal heart sounds.  Pulmonary:     Effort: Pulmonary effort is normal. No respiratory distress.     Breath sounds: Normal breath sounds. No wheezing.  Abdominal:     General: There is no distension.     Palpations: Abdomen is soft.     Tenderness: There is no abdominal tenderness.  Musculoskeletal:        General: No tenderness. Normal range of motion.     Cervical back: Normal range of motion and neck supple.  Lymphadenopathy:     Cervical: No cervical  adenopathy.  Skin:    General: Skin is warm and dry.  Neurological:     Mental Status: He is alert and oriented to person, place, and time.     Deep Tendon Reflexes: Reflexes are normal and symmetric.  Psychiatric:        Behavior: Behavior normal.     Assessment & Plan:  QUANTA ROHER is a 64 y.o. male . Annual physical exam  --anticipatory  guidance as below in AVS, screening labs above. Health maintenance items as above in HPI discussed/recommended as applicable.   Screening for diabetes mellitus - Plan: Hemoglobin A1c  Primary hypertension  - Stay with current regimen.  Follow-up with cardiology.  No med changes at this time.  Hyperlipidemia, unspecified hyperlipidemia type - Plan: Comprehensive metabolic panel with GFR, Lipid panel History of ST elevation myocardial infarction (STEMI) - Plan: Lipid panel Statin myopathy [G72.0, T46.6X5A]  - Intolerant to statins, tolerating Repatha .  Check updated labs and follow-up with cardiology.  Asymptomatic.  No orders of the defined types were placed in this encounter.  Patient Instructions  Thank you for coming in today. No change in medications at this time. Try to set a reasonable goal for exercise (15 minutes once in next week) and work up from there for a goal of most days per week.  Call cardiology and dermatology for follow up.   If there are any concerns on your bloodwork, I will let you know. Take care!  [facility] or xray: Walk in 8:30-4:30 during weekdays, no appointment needed [address]  [place], [place] [address]  Preventive Care 19-29 Years Old, Male Preventive care refers to lifestyle choices and visits with your health care provider that can promote health and wellness. Preventive care visits are also called wellness exams. What can I expect for my preventive care visit? Counseling During your preventive care visit, your health care provider may ask about your: Medical history, including: Past medical problems. Family medical history. Current health, including: Emotional well-being. Home life and relationship well-being. Sexual activity. Lifestyle, including: Alcohol, nicotine or tobacco, and drug use. Access to firearms. Diet, exercise, and sleep habits. Safety issues such as seatbelt and bike helmet use. Sunscreen use. Work and work  astronomer. Physical exam Your health care provider will check your: Height and weight. These may be used to calculate your BMI (body mass index). BMI is a measurement that tells if you are at a healthy weight. Waist circumference. This measures the distance around your waistline. This measurement also tells if you are at a healthy weight and may help predict your risk of certain diseases, such as type 2 diabetes and high blood pressure. Heart rate and blood pressure. Body temperature. Skin for abnormal spots. What immunizations do I need?  Vaccines are usually given at various ages, according to a schedule. Your health care provider will recommend vaccines for you based on your age, medical history, and lifestyle or other factors, such as travel or where you work. What tests do I need? Screening Your health care provider may recommend screening tests for certain conditions. This may include: Lipid and cholesterol levels. Diabetes screening. This is done by checking your blood sugar (glucose) after you have not eaten for a while (fasting). Hepatitis B test. Hepatitis C test. HIV (human immunodeficiency virus) test. STI (sexually transmitted infection) testing, if you are at risk. Lung cancer screening. Prostate cancer screening. Colorectal cancer screening. Talk with your health care provider about your test results, treatment options,  and if necessary, the need for more tests. Follow these instructions at home: Eating and drinking  Eat a diet that includes fresh fruits and vegetables, whole grains, lean protein, and low-fat dairy products. Take vitamin and mineral supplements as recommended by your health care provider. Do not drink alcohol if your health care provider tells you not to drink. If you drink alcohol: Limit how much you have to 0-2 drinks a day. Know how much alcohol is in your drink. In the [place] one drink equals one 12 oz bottle of beer (355 mL), one 5 oz glass of  wine (148 mL), or one 1 oz glass of hard liquor (44 mL). Lifestyle Brush your teeth every morning and night with fluoride toothpaste. Floss one time each day. Exercise for at least 30 minutes 5 or more days each week. Do not use any products that contain nicotine or tobacco. These products include cigarettes, chewing tobacco, and vaping devices, such as e-cigarettes. If you need help quitting, ask your health care provider. Do not use drugs. If you are sexually active, practice safe sex. Use a condom or other form of protection to prevent STIs. Take aspirin  only as told by your health care provider. Make sure that you understand how much to take and what form to take. Work with your health care provider to find out whether it is safe and beneficial for you to take aspirin  daily. Find healthy ways to manage stress, such as: Meditation, yoga, or listening to music. Journaling. Talking to a trusted person. Spending time with friends and family. Minimize exposure to UV radiation to reduce your risk of skin cancer. Safety Always wear your seat belt while driving or riding in a vehicle. Do not drive: If you have been drinking alcohol. Do not ride with someone who has been drinking. When you are tired or distracted. While texting. If you have been using any mind-altering substances or drugs. Wear a helmet and other protective equipment during sports activities. If you have firearms in your house, make sure you follow all gun safety procedures. What's next? Go to your health care provider once a year for an annual wellness visit. Ask your health care provider how often you should have your eyes and teeth checked. Stay up to date on all vaccines. This information is not intended to replace advice given to you by your health care provider. Make sure you discuss any questions you have with your health care provider. Document Revised: 03/08/2021 Document Reviewed: 03/08/2021 Elsevier Patient  Education  2024 Elsevier Inc.     Signed,   Reyes Pines, MD [facility], [facility] 09/30/2024 4:23 PM      [1]  Allergies Allergen Reactions   Lisinopril      cough   Mushroom Diarrhea   Other     Mushrooms - GI Problems   Statins     Myalgias    "

## 2024-09-30 NOTE — Patient Instructions (Addendum)
 Thank you for coming in today. No change in medications at this time. Try to set a reasonable goal for exercise (15 minutes once in next week) and work up from there for a goal of most days per week.  Call cardiology and dermatology for follow up.   If there are any concerns on your bloodwork, I will let you know. Take care!  [facility] or xray: Walk in 8:30-4:30 during weekdays, no appointment needed [address]  [place], [place] [address]  Preventive Care 55-65 Years Old, Male Preventive care refers to lifestyle choices and visits with your health care provider that can promote health and wellness. Preventive care visits are also called wellness exams. What can I expect for my preventive care visit? Counseling During your preventive care visit, your health care provider may ask about your: Medical history, including: Past medical problems. Family medical history. Current health, including: Emotional well-being. Home life and relationship well-being. Sexual activity. Lifestyle, including: Alcohol, nicotine or tobacco, and drug use. Access to firearms. Diet, exercise, and sleep habits. Safety issues such as seatbelt and bike helmet use. Sunscreen use. Work and work astronomer. Physical exam Your health care provider will check your: Height and weight. These may be used to calculate your BMI (body mass index). BMI is a measurement that tells if you are at a healthy weight. Waist circumference. This measures the distance around your waistline. This measurement also tells if you are at a healthy weight and may help predict your risk of certain diseases, such as type 2 diabetes and high blood pressure. Heart rate and blood pressure. Body temperature. Skin for abnormal spots. What immunizations do I need?  Vaccines are usually given at various ages, according to a schedule. Your health care provider will recommend vaccines for you based on your age, medical history, and  lifestyle or other factors, such as travel or where you work. What tests do I need? Screening Your health care provider may recommend screening tests for certain conditions. This may include: Lipid and cholesterol levels. Diabetes screening. This is done by checking your blood sugar (glucose) after you have not eaten for a while (fasting). Hepatitis B test. Hepatitis C test. HIV (human immunodeficiency virus) test. STI (sexually transmitted infection) testing, if you are at risk. Lung cancer screening. Prostate cancer screening. Colorectal cancer screening. Talk with your health care provider about your test results, treatment options, and if necessary, the need for more tests. Follow these instructions at home: Eating and drinking  Eat a diet that includes fresh fruits and vegetables, whole grains, lean protein, and low-fat dairy products. Take vitamin and mineral supplements as recommended by your health care provider. Do not drink alcohol if your health care provider tells you not to drink. If you drink alcohol: Limit how much you have to 0-2 drinks a day. Know how much alcohol is in your drink. In the [place] one drink equals one 12 oz bottle of beer (355 mL), one 5 oz glass of wine (148 mL), or one 1 oz glass of hard liquor (44 mL). Lifestyle Brush your teeth every morning and night with fluoride toothpaste. Floss one time each day. Exercise for at least 30 minutes 5 or more days each week. Do not use any products that contain nicotine or tobacco. These products include cigarettes, chewing tobacco, and vaping devices, such as e-cigarettes. If you need help quitting, ask your health care provider. Do not use drugs. If you are sexually active, practice safe sex. Use  a condom or other form of protection to prevent STIs. Take aspirin  only as told by your health care provider. Make sure that you understand how much to take and what form to take. Work with your health care provider to find  out whether it is safe and beneficial for you to take aspirin  daily. Find healthy ways to manage stress, such as: Meditation, yoga, or listening to music. Journaling. Talking to a trusted person. Spending time with friends and family. Minimize exposure to UV radiation to reduce your risk of skin cancer. Safety Always wear your seat belt while driving or riding in a vehicle. Do not drive: If you have been drinking alcohol. Do not ride with someone who has been drinking. When you are tired or distracted. While texting. If you have been using any mind-altering substances or drugs. Wear a helmet and other protective equipment during sports activities. If you have firearms in your house, make sure you follow all gun safety procedures. What's next? Go to your health care provider once a year for an annual wellness visit. Ask your health care provider how often you should have your eyes and teeth checked. Stay up to date on all vaccines. This information is not intended to replace advice given to you by your health care provider. Make sure you discuss any questions you have with your health care provider. Document Revised: 03/08/2021 Document Reviewed: 03/08/2021 Elsevier Patient Education  2024 [employer].

## 2024-10-01 ENCOUNTER — Other Ambulatory Visit (INDEPENDENT_AMBULATORY_CARE_PROVIDER_SITE_OTHER): Payer: PRIVATE HEALTH INSURANCE

## 2024-10-01 DIAGNOSIS — E785 Hyperlipidemia, unspecified: Secondary | ICD-10-CM

## 2024-10-01 DIAGNOSIS — Z131 Encounter for screening for diabetes mellitus: Secondary | ICD-10-CM

## 2024-10-01 DIAGNOSIS — I252 Old myocardial infarction: Secondary | ICD-10-CM

## 2024-10-01 LAB — LIPID PANEL
Cholesterol: 168 mg/dL (ref 28–200)
HDL: 36.1 mg/dL — ABNORMAL LOW
LDL Cholesterol: 84 mg/dL (ref 10–99)
NonHDL: 131.93
Total CHOL/HDL Ratio: 5
Triglycerides: 242 mg/dL — ABNORMAL HIGH (ref 10.0–149.0)
VLDL: 48.4 mg/dL — ABNORMAL HIGH (ref 0.0–40.0)

## 2024-10-01 LAB — COMPREHENSIVE METABOLIC PANEL WITH GFR
ALT: 38 U/L (ref 3–53)
AST: 22 U/L (ref 5–37)
Albumin: 4.2 g/dL (ref 3.5–5.2)
Alkaline Phosphatase: 47 U/L (ref 39–117)
BUN: 17 mg/dL (ref 6–23)
CO2: 29 meq/L (ref 19–32)
Calcium: 8.8 mg/dL (ref 8.4–10.5)
Chloride: 103 meq/L (ref 96–112)
Creatinine, Ser: 1.06 mg/dL (ref 0.40–1.50)
GFR: 74.01 mL/min
Glucose, Bld: 86 mg/dL (ref 70–99)
Potassium: 4.3 meq/L (ref 3.5–5.1)
Sodium: 139 meq/L (ref 135–145)
Total Bilirubin: 0.6 mg/dL (ref 0.2–1.2)
Total Protein: 7.4 g/dL (ref 6.0–8.3)

## 2024-10-01 LAB — HEMOGLOBIN A1C: Hgb A1c MFr Bld: 5.2 % (ref 4.6–6.5)

## 2024-10-02 ENCOUNTER — Ambulatory Visit: Payer: Self-pay | Admitting: Family Medicine

## 2024-10-25 ENCOUNTER — Other Ambulatory Visit: Payer: Self-pay | Admitting: Cardiovascular Disease
